# Patient Record
Sex: Female | Born: 1935 | Race: White | Hispanic: No | Marital: Single | Smoking: Never smoker
Health system: Southern US, Community
[De-identification: ages and names within clinical notes are randomized; demographics above are authoritative.]

## PROBLEM LIST (undated history)

## (undated) DIAGNOSIS — I2510 Atherosclerotic heart disease of native coronary artery without angina pectoris: Secondary | ICD-10-CM

## (undated) DIAGNOSIS — M5430 Sciatica, unspecified side: Secondary | ICD-10-CM

## (undated) DIAGNOSIS — S72009A Fracture of unspecified part of neck of unspecified femur, initial encounter for closed fracture: Secondary | ICD-10-CM

## (undated) DIAGNOSIS — M1990 Unspecified osteoarthritis, unspecified site: Secondary | ICD-10-CM

## (undated) DIAGNOSIS — E785 Hyperlipidemia, unspecified: Secondary | ICD-10-CM

## (undated) DIAGNOSIS — H9190 Unspecified hearing loss, unspecified ear: Secondary | ICD-10-CM

## (undated) DIAGNOSIS — I10 Essential (primary) hypertension: Secondary | ICD-10-CM

## (undated) DIAGNOSIS — I219 Acute myocardial infarction, unspecified: Secondary | ICD-10-CM

## (undated) DIAGNOSIS — G629 Polyneuropathy, unspecified: Secondary | ICD-10-CM

## (undated) HISTORY — DX: Atherosclerotic heart disease of native coronary artery without angina pectoris: I25.10

## (undated) HISTORY — DX: Essential (primary) hypertension: I10

## (undated) HISTORY — DX: Hyperlipidemia, unspecified: E78.5

## (undated) HISTORY — PX: OTHER SURGICAL HISTORY: SHX169

## (undated) HISTORY — PX: ABDOMINAL HYSTERECTOMY: SHX81

---

## 1958-08-07 HISTORY — PX: DILATION AND CURETTAGE OF UTERUS: SHX78

## 1996-08-07 HISTORY — PX: CATARACT EXTRACTION: SUR2

## 1998-08-07 HISTORY — PX: CHOLECYSTECTOMY: SHX55

## 2000-08-07 DIAGNOSIS — I219 Acute myocardial infarction, unspecified: Secondary | ICD-10-CM

## 2000-08-07 HISTORY — PX: REFRACTIVE SURGERY: SHX103

## 2000-08-07 HISTORY — PX: PTCA: SHX146

## 2000-08-07 HISTORY — DX: Acute myocardial infarction, unspecified: I21.9

## 2007-08-08 HISTORY — PX: NM MYOVIEW LTD: HXRAD82

## 2009-10-21 ENCOUNTER — Encounter: Payer: Self-pay | Admitting: Family Medicine

## 2010-04-19 ENCOUNTER — Ambulatory Visit: Payer: Self-pay | Admitting: Family Medicine

## 2010-04-19 DIAGNOSIS — E559 Vitamin D deficiency, unspecified: Secondary | ICD-10-CM | POA: Insufficient documentation

## 2010-04-19 DIAGNOSIS — M1A9XX1 Chronic gout, unspecified, with tophus (tophi): Secondary | ICD-10-CM

## 2010-04-19 DIAGNOSIS — I10 Essential (primary) hypertension: Secondary | ICD-10-CM

## 2010-04-19 DIAGNOSIS — E785 Hyperlipidemia, unspecified: Secondary | ICD-10-CM

## 2010-04-20 LAB — CONVERTED CEMR LAB
CO2: 26 meq/L (ref 19–32)
Chloride: 97 meq/L (ref 96–112)
GFR calc non Af Amer: 54.76 mL/min (ref >60)
Glucose, Bld: 94 mg/dL (ref 70–99)
HDL: 44.6 mg/dL (ref >39.00)
LDL Cholesterol: 75 mg/dL (ref 0–99)
Potassium: 4.2 meq/L (ref 3.5–5.1)
Sodium: 136 meq/L (ref 135–145)
Total Bilirubin: 0.7 mg/dL (ref 0.3–1.2)
Total CHOL/HDL Ratio: 3
VLDL: 23.4 mg/dL (ref 0.0–40.0)

## 2010-05-20 ENCOUNTER — Ambulatory Visit: Payer: Self-pay | Admitting: Family Medicine

## 2010-09-06 NOTE — Letter (Signed)
Summary: Records from [facility] [place]  Records from [facility] [place]   Imported By: Maryln Gottron 04/22/2010 12:20:50  _____________________________________________________________________  External Attachment:    Type:   Image     Comment:   External Document

## 2010-09-06 NOTE — Assessment & Plan Note (Signed)
Summary: 1 MONTH FOLLOW UP/C[facility]   Vital Signs:  Patient profile:   77 year old female Menstrual status:  postmenopausal Weight:      152 pounds Temp:     98.5 degrees F oral BP sitting:   160 / 84  (left arm) BP standing:   128 / 70 Cuff size:   regular  Vitals Entered By: Sid Falcon LPN (May 20, 2010 10:14 AM)  Serial Vital Signs/Assessments:  Time      Position  BP       Pulse  Resp  Temp     By           Sitting   142/80                         Evelena Peat MD           Standing  128/70                         Evelena Peat MD   History of Present Illness: Patient seen in followup hypertension. Elevated blood pressure last visit. She takes lisinopril, Toprol, and hydrochlorothiazide and compliant with all. Occasional lightheadedness when she first stands up. No syncope. Denies any chest pains.  Patient has gout and needs refills allopurinol. no recent gout flares.  Allergies (verified): 1)  Penicillin V Potassium (Penicillin V Potassium)  Review of Systems      See HPI  Physical Exam  General:  Well-developed,well-nourished,in no acute distress; alert,appropriate and cooperative throughout examination Lungs:  Normal respiratory effort, chest expands symmetrically. Lungs are clear to auscultation, no crackles or wheezes. Heart:  normal rate and regular rhythm.   Extremities:  no edema.   Impression & Recommendations:  Problem # 1:  HYPERTENSION (ICD-401.9) Assessment Improved improved by today's reading. No need for additional medicine especially given drop in blood pressure with standing but no significant  orthostatic change Her updated medication list for this problem includes:    Lisinopril 20 Mg Tabs (Lisinopril) ..... Once daily    Toprol Xl 50 Mg Xr24h-tab (Metoprolol succinate) ..... Once daily    Hydrochlorothiazide 25 Mg Tabs (Hydrochlorothiazide) ..... Once daily  Problem # 2:  GOUTY TOPHI (ICD-274.82)  Her updated medication list for this  problem includes:    Allopurinol 300 Mg Tabs (Allopurinol) ..... Once daily  Orders: Prescription Created Electronically [phone])  Complete Medication List: 1)  Lisinopril 20 Mg Tabs (Lisinopril) .... Once daily 2)  Plavix 75 Mg Tabs (Clopidogrel bisulfate) .... Once daily 3)  Toprol Xl 50 Mg Xr24h-tab (Metoprolol succinate) .... Once daily 4)  Hydrochlorothiazide 25 Mg Tabs (Hydrochlorothiazide) .... Once daily 5)  Allopurinol 300 Mg Tabs (Allopurinol) .... Once daily 6)  Calcium Plus Vitamin D 300-100 Mg-unit Caps (Calcium carbonate-vitamin d) .... Once daily 7)  Aspirin 81 Mg Tabs (Aspirin) .... Once daily 8)  Lipitor 20 Mg Tabs (Atorvastatin calcium) .... Once daily 9)  One-a-day Womens Prenatal 28-0.8 & 440 Mg Misc (Prenatal vit-fe fum-fa-omega) .... Once daily  Patient Instructions: 1)  Please schedule a follow-up appointment in 6 months .  Prescriptions: ALLOPURINOL 300 MG TABS (ALLOPURINOL) once daily  #90 x 3   Entered and Authorized by:   Evelena Peat MD   Signed by:   Evelena Peat MD on 05/20/2010   Method used:   Faxed to ...       [facility] (mail-order)             ,  [place]         Ph: [phone]       Fax: [phone]   RxID:   [id number]

## 2010-09-06 NOTE — Assessment & Plan Note (Signed)
Summary: to estab/pt will come fasting/cb   Vital Signs:  Patient profile:   77 year old female Menstrual status:  postmenopausal Height:      60.50 inches Weight:      153 pounds BMI:     29.50 Temp:     98.2 degrees F oral Pulse rate:   72 / minute Pulse rhythm:   regular Resp:     12 per minute BP sitting:   160 / 90  (left arm) Cuff size:   regular  Vitals Entered By: Sid Falcon LPN (April 19, 2010 9:00 AM)  Nutrition Counseling: Patient's BMI is greater than 25 and therefore counseled on weight management options.  Serial Vital Signs/Assessments:  Time      Position  BP       Pulse  Resp  Temp     By                     150/88                         Evelena Peat MD  CC: New to establish, pt fasting for labs Is Patient Diabetic? No     Menstrual Status postmenopausal   History of Present Illness: New patient to establish care.  Patient's past medical history reviewed. She has history of coronary artery disease with reported MI and stent placement in 2002. She sees cardiologist regularly. She also has history of hypertension and hyperlipidemia. Her medications were reviewed.  She has history of tophaceous gout. Uric acid this past March 8.8 and allopurinol increased at that point to 300 mg. She initially had some diarrhea but no side effects at this time and is compliant with medication.  Needs followup level.  History of vitamin D deficiency with vitamin D level of 20 this past March. She started over-the-counter supplement 2000 international units per day at that time.  Previous cholecystectomy. Total abdominal hysterotomy secondary to fibroids.  Family history signif for father with heart disease. Social history is that she has never married. No children. Nonsmoker. No alcohol use. Retired Set designer. She moved here from [place] about 6 years ago to be closer to family  Preventive Screening-Counseling &  Management  Alcohol-Tobacco     Smoking Status: never  Caffeine-Diet-Exercise     Does Patient Exercise: no      Blood Transfusions:  yes.    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[place] Single Never Smoked Alcohol use-no Regular exercise-no  Risk Factors: Exercise: no (04/19/2010)  Risk Factors: Smoking Status: never (04/19/2010)  Past Medical History: Tophaceous gout Heart disease Stent 2002 Hyperlipidemia Hypertension Vit D deficiency  Past Surgical History: Cholecystectomy, 2000 Hysterectomy, 1976  TAH sec fibroids Cataracts, 1998 MI, stent put in 2002 PMH-FH-SH reviewed for relevance  Family History: Family History of Arthritis, mother Heart disease, father  Social History: 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    Physical Exam  General:  Well-developed,well-nourished,in no acute distress; alert,appropriate and cooperative throughout examination Head:  normocephalic, atraumatic, and no abnormalities observed.   Eyes:  pupils equal, pupils round, and pupils reactive to light.   Ears:  External ear exam shows no significant lesions or deformities.  Otoscopic examination reveals clear canals, tympanic membranes are intact bilaterally without bulging, retraction, inflammation or discharge. Hearing is grossly normal  bilaterally. Mouth:  Oral mucosa and oropharynx without lesions or exudates.  Teeth in good repair. Neck:  No deformities, masses, or tenderness noted. Lungs:  Normal respiratory effort, chest expands symmetrically. Lungs are clear to auscultation, no crackles or wheezes. Heart:  normal rate and regular rhythm.   Abdomen:  soft and non-tender.   Msk:  R second toe tophaceous changes but no eruption through skin.  R index finger tophaceous changes at DIP joint. Extremities:  no significant pitting edema Neurologic:  alert & oriented X3, cranial nerves II-XII intact, and gait normal.   Skin:  no rashes.   Cervical Nodes:  No lymphadenopathy noted Psych:  normally interactive, good eye contact, not anxious appearing, and not depressed appearing.     Impression & Recommendations:  Problem # 1:  HYPERLIPIDEMIA (ICD-272.4)  Orders: Specimen Handling (84696) Venipuncture (29528) TLB-Lipid Panel (80061-LIPID) TLB-Hepatic/Liver Function Pnl (80076-HEPATIC)  Problem # 2:  HYPERTENSION (ICD-401.9)  somewhat elevated today. Bring back in one month to reassess. Her updated medication list for this problem includes:    Lisinopril 20 Mg Tabs (Lisinopril) ..... Once daily    Toprol Xl 50 Mg Xr24h-tab (Metoprolol succinate) ..... Once daily    Hydrochlorothiazide 25 Mg Tabs (Hydrochlorothiazide) ..... Once daily  Orders: Specimen Handling (41324) Venipuncture (40102) TLB-BMP (Basic Metabolic Panel-BMET) (80048-METABOL)  Problem # 3:  GOUTY TOPHI (ICD-274.82) repeat uric acid.  hopefully, these will improve with time. Her updated medication list for this problem includes:    Allopurinol 300 Mg Tabs (Allopurinol) ..... Once daily  Orders: TLB-Uric Acid, Blood (84550-URIC)  Problem # 4:  VITAMIN D DEFICIENCY (ICD-268.9) repeat levels. Orders: T-Vitamin D (25-Hydroxy) [phone]) Specimen Handling (47425) Venipuncture (95638)  Problem # 5:  CAD (ICD-414.00)  Her updated medication  list for this problem includes:    Lisinopril 20 Mg Tabs (Lisinopril) ..... Once daily    Plavix 75 Mg Tabs (Clopidogrel bisulfate) ..... Once daily    Toprol Xl 50 Mg Xr24h-tab (Metoprolol succinate) ..... Once daily    Hydrochlorothiazide 25 Mg Tabs (Hydrochlorothiazide) ..... Once daily    Aspirin 81 Mg Tabs (Aspirin) ..... Once daily  Orders: TLB-Lipid Panel (80061-LIPID)  Problem # 6:  Preventive Health Care (ICD-V70.0) needs flu vaccine and Tdap.  She has had prior PVX.  Complete Medication List: 1)  Lisinopril 20 Mg Tabs (Lisinopril) .... Once daily 2)  Plavix 75 Mg Tabs (Clopidogrel bisulfate) .... Once daily 3)  Toprol Xl 50 Mg Xr24h-tab (Metoprolol succinate) .... Once daily 4)  Hydrochlorothiazide 25 Mg Tabs (Hydrochlorothiazide) .... Once daily 5)  Allopurinol 300 Mg Tabs (Allopurinol) .... Once daily 6)  Calcium Plus Vitamin D 300-100 Mg-unit Caps (Calcium carbonate-vitamin d) .... Once daily 7)  Aspirin 81 Mg Tabs (Aspirin) .... Once daily  Other Orders: Tdap => 16yrs IM (75643) Admin 1st Vaccine (32951) Flu Vaccine 81yrs + MEDICARE PATIENTS (O8416) Administration Flu vaccine - MCR (S0630)  Patient Instructions: 1)  Please schedule a follow-up appointment in 1 month.  2)  Check your  Blood Pressure regularly . If it is above:140/90  you should make an appointment.    Immunizations Administered:  Tetanus Vaccine:    Vaccine Type: Tdap    Site: left deltoid    Mfr: GlaxoSmithKline    Dose: 0.5 ml    Route: IM    Given by: Sid Falcon LPN    Exp. Date: 04/08/2012    Lot #: [id number]    VIS given: 06/24/08 version given April 19, 2010.         Flu Vaccine Consent Questions     Do you have a history of severe allergic reactions to this vaccine? no    Any prior history of allergic reactions to egg and/or gelatin? no    Do you have a sensitivity to the preservative Thimersol? no    Do you have a past history of Guillan-Barre Syndrome? no    Do  you currently have an acute febrile illness? no    Have you ever had a severe reaction to latex? no    Vaccine information given and explained to patient? yes    Are you currently pregnant? no    Lot Number:[id number]   Exp Date:02/04/2011   Site Given  Right Deltoid IMu

## 2010-10-12 ENCOUNTER — Encounter: Payer: Self-pay | Admitting: *Deleted

## 2010-10-12 DIAGNOSIS — E785 Hyperlipidemia, unspecified: Secondary | ICD-10-CM | POA: Insufficient documentation

## 2010-10-12 DIAGNOSIS — I10 Essential (primary) hypertension: Secondary | ICD-10-CM | POA: Insufficient documentation

## 2010-10-20 ENCOUNTER — Encounter: Payer: Self-pay | Admitting: Family Medicine

## 2010-10-21 ENCOUNTER — Ambulatory Visit (INDEPENDENT_AMBULATORY_CARE_PROVIDER_SITE_OTHER): Payer: Medicare Other | Admitting: Family Medicine

## 2010-10-21 ENCOUNTER — Encounter: Payer: Self-pay | Admitting: Family Medicine

## 2010-10-21 VITALS — BP 150/90 | Temp 98.3°F | Ht 61.0 in | Wt 153.0 lb

## 2010-10-21 DIAGNOSIS — I10 Essential (primary) hypertension: Secondary | ICD-10-CM

## 2010-10-21 DIAGNOSIS — L989 Disorder of the skin and subcutaneous tissue, unspecified: Secondary | ICD-10-CM

## 2010-10-21 DIAGNOSIS — M1A9XX1 Chronic gout, unspecified, with tophus (tophi): Secondary | ICD-10-CM

## 2010-10-21 DIAGNOSIS — I2510 Atherosclerotic heart disease of native coronary artery without angina pectoris: Secondary | ICD-10-CM

## 2010-10-21 DIAGNOSIS — E785 Hyperlipidemia, unspecified: Secondary | ICD-10-CM

## 2010-10-21 MED ORDER — HYDROCHLOROTHIAZIDE 25 MG PO TABS: 25.0000 mg | ORAL_TABLET | Freq: Every day | ORAL | Status: DC

## 2010-10-21 MED ORDER — METOPROLOL SUCCINATE ER 50 MG PO TB24: 50.0000 mg | ORAL_TABLET | Freq: Every day | ORAL | Status: DC

## 2010-10-21 MED ORDER — ATORVASTATIN CALCIUM 20 MG PO TABS: 20.0000 mg | ORAL_TABLET | Freq: Every day | ORAL | Status: DC

## 2010-10-21 MED ORDER — CLOPIDOGREL BISULFATE 75 MG PO TABS: 75.0000 mg | ORAL_TABLET | Freq: Every day | ORAL | Status: DC

## 2010-10-21 MED ORDER — LISINOPRIL 20 MG PO TABS: 20.0000 mg | ORAL_TABLET | Freq: Every day | ORAL | Status: DC

## 2010-10-21 NOTE — Progress Notes (Signed)
  Subjective:    Patient ID: [name] [name], female    DOB: 11-25-1927, 77 y.o.   MRN: [id number]  HPI  Patient here followup multiple medical problems. She has history of coronary artery disease, gout , hyperlipidemia, hypertension. History of cardiac stent back in 2002. Has done well since then. No recent chest pain. Compliant with all medications. Needs refills of several medications today.   No recent orthostatic symptoms. No recent gout flare. She has not had any recent falls or dizziness.  No medication side effects reported.   Skin lesion left cheek region which as been present for several months. Slowly growing. No history of skin cancer.   Review of Systems  Constitutional: Negative for fever, chills, activity change and fatigue.  HENT: Negative for trouble swallowing and neck pain.   Respiratory: Negative for cough and shortness of breath.   Cardiovascular: Negative for chest pain, palpitations and leg swelling.  Gastrointestinal: Negative for abdominal pain and blood in stool.  Genitourinary: Negative for dysuria.  Neurological: Negative for syncope and headaches.  Psychiatric/Behavioral: Negative for dysphoric mood.       Objective:   Physical Exam     Patient alert and in no distress  Oropharynx no lesions Neck supple no mass Chest clear to auscultation Heart regular rhythm and rate  Extremities no pitting edema  Skin exam left cheek region nodular lesion which is about 1 cm diameter with minimal ulceration and  Few telangiectasias    Assessment & Plan:   #1 skin lesion left cheek, probable basal cell carcinoma. Referral skin surgery Center  #2 history of CAD #3 hyperlipidemia #4 hypertension

## 2010-10-22 ENCOUNTER — Encounter: Payer: Self-pay | Admitting: Family Medicine

## 2010-11-04 ENCOUNTER — Ambulatory Visit: Payer: Self-pay | Admitting: Cardiovascular Disease

## 2010-12-07 ENCOUNTER — Encounter: Payer: Self-pay | Admitting: Cardiovascular Disease

## 2010-12-07 ENCOUNTER — Ambulatory Visit (INDEPENDENT_AMBULATORY_CARE_PROVIDER_SITE_OTHER): Payer: Medicare Other | Admitting: Cardiovascular Disease

## 2010-12-07 VITALS — BP 148/88 | HR 61 | Ht 61.0 in | Wt 154.0 lb

## 2010-12-07 DIAGNOSIS — I2510 Atherosclerotic heart disease of native coronary artery without angina pectoris: Secondary | ICD-10-CM

## 2010-12-07 NOTE — Assessment & Plan Note (Signed)
Magdalynn seems to be doing very well. She is not having any episodes of chest pain. She stated limited by her severe back arthritis. We'll continue with her same medications.

## 2010-12-07 NOTE — Progress Notes (Signed)
[name] [name] Date of Birth  Dec 23, 1927 [facility] [address]     [address] [place], [place]  [address] [phone]  Fax  [phone]  History of Present Illness:  No chest pain or dyspnea.  She does not get up and exercise much because she is limited by her severe arthritis. She has lots of back pain and in fact had difficulty getting up on the exam table.   Current Outpatient Prescriptions on File Prior to Visit  Medication Sig Dispense Refill  . allopurinol (ZYLOPRIM) 100 MG tablet Take 100 mg by mouth daily.        [name] aspirin 81 MG tablet Take 81 mg by mouth daily.        . Calcium Carbonate-Vitamin D 300-100 MG-UNIT CAPS Take by mouth daily.        . clindamycin (CLEOCIN) 150 MG capsule Take 150 mg by mouth as needed.        . clopidogrel (PLAVIX) 75 MG tablet Take 1 tablet (75 mg total) by mouth daily.  90 tablet  3  . hydrochlorothiazide 25 MG tablet Take 1 tablet (25 mg total) by mouth daily.  90 tablet  3  . lisinopril (PRINIVIL,ZESTRIL) 20 MG tablet Take 1 tablet (20 mg total) by mouth daily.  90 tablet  3  . metoprolol (TOPROL-XL) 50 MG 24 hr tablet Take 1 tablet (50 mg total) by mouth daily.  30 tablet  0  . atorvastatin (LIPITOR) 20 MG tablet Take 1 tablet (20 mg total) by mouth daily.  90 tablet  3    Allergies  Allergen Reactions  . Penicillins     REACTION: swelling    Past Medical History  Diagnosis Date  . CAD (coronary artery disease)   . HTN (hypertension)   . Hyperlipidemia     Past Surgical History  Procedure Date  . Dilation and curettage of uterus 1960  . Hystrectomy   . Cataract extraction 1998    RIGHT AND LEFT EYE  . Cholecystectomy 2000  . Refractive surgery 2002  . Ptca 2002    History  Smoking status  . Never Smoker   Smokeless tobacco  . Not on file    History  Alcohol Use     Family History  Problem Relation Age of Onset  . Hypertension Father     DECEASED  . Cancer Sister 64     BREAST/BONE CA  . Pneumonia Mother 73  . Aneurysm Brother 33    DECEASED    Reviw of Systems:  Reviewed in the HPI.  All other systems are negative.  Physical Exam: BP 148/88  Pulse 61  Ht 5\' 1"  (1.549 m)  Wt 154 lb (69.854 kg)  BMI 29.10 kg/m2 The patient is alert and oriented x 3.  The mood and affect are normal.  The skin is warm and dry.  Color is normal.  The HEENT exam reveals that the sclera are nonicteric.  The mucous membranes are moist.  The carotids are 2+ without bruits.  There is no thyromegaly.  There is no JVD.  The lungs are clear.  The chest wall is non tender.  The heart exam reveals a regular rate with a normal S1 and S2.  There are no murmurs, gallops, or rubs.  The PMI is not displaced.   Abdominal exam reveals good bowel sounds.  There is no guarding or rebound.  There is no hepatosplenomegaly or tenderness.  There are no  masses.  Exam of the legs reveal no clubbing, cyanosis, or edema.  The legs are without rashes.  The distal pulses are intact.  Cranial nerves II - XII are intact.  Motor and sensory functions are intact.  The gait is normal.  ECG: Normal sinus rhythm. She has an old inferior wall myocardial infarctions. She has poor R-wave progression. Assessment / Plan:

## 2010-12-15 ENCOUNTER — Encounter: Payer: Self-pay | Admitting: Cardiovascular Disease

## 2010-12-16 ENCOUNTER — Encounter: Payer: Self-pay | Admitting: Cardiovascular Disease

## 2011-04-21 ENCOUNTER — Encounter: Payer: Self-pay | Admitting: Family Medicine

## 2011-04-21 ENCOUNTER — Ambulatory Visit (INDEPENDENT_AMBULATORY_CARE_PROVIDER_SITE_OTHER): Payer: Medicare Other | Admitting: Family Medicine

## 2011-04-21 DIAGNOSIS — I10 Essential (primary) hypertension: Secondary | ICD-10-CM

## 2011-04-21 DIAGNOSIS — I2510 Atherosclerotic heart disease of native coronary artery without angina pectoris: Secondary | ICD-10-CM

## 2011-04-21 DIAGNOSIS — Z23 Encounter for immunization: Secondary | ICD-10-CM

## 2011-04-21 DIAGNOSIS — E785 Hyperlipidemia, unspecified: Secondary | ICD-10-CM

## 2011-04-21 DIAGNOSIS — M1A9XX1 Chronic gout, unspecified, with tophus (tophi): Secondary | ICD-10-CM

## 2011-04-21 LAB — HEPATIC FUNCTION PANEL
AST: 20 U/L (ref 0–37)
Albumin: 4.2 g/dL (ref 3.5–5.2)
Alkaline Phosphatase: 69 U/L (ref 39–117)
Bilirubin, Direct: 0.1 mg/dL (ref 0.0–0.3)
Total Protein: 6.9 g/dL (ref 6.0–8.3)

## 2011-04-21 LAB — LIPID PANEL
Cholesterol: 142 mg/dL (ref 0–200)
LDL Cholesterol: 59 mg/dL (ref 0–99)
Triglycerides: 144 mg/dL (ref 0.0–149.0)

## 2011-04-21 LAB — BASIC METABOLIC PANEL
BUN: 16 mg/dL (ref 6–23)
Calcium: 9.9 mg/dL (ref 8.4–10.5)
Chloride: 98 mEq/L (ref 96–112)
Creatinine, Ser: 0.9 mg/dL (ref 0.4–1.2)

## 2011-04-21 NOTE — Progress Notes (Signed)
  Subjective:    Patient ID: [name] [name], female    DOB: [birth year], 77 y.o.   MRN: [id number]  HPI Medical followup. History of CAD, hypertension, hyperlipidemia, and gout.  No recent chest pains. Takes Lipitor for hyperlipidemia. No myalgias. Needs followup lab work. Blood pressure medications reviewed. Compliant with all. She has history of mild elevation here but improves after rest and tends to drop with standing. She's had no orthostasis. No dizziness or headaches.  History of gout. History of tophaceous changes. No recent pain. Tried allopurinol but discontinued because of diarrhea.  Needs flu vaccine. She has had pneumonia vaccine within the past few years  Past Medical History  Diagnosis Date  . CAD (coronary artery disease)   . HTN (hypertension)   . Hyperlipidemia    Past Surgical History  Procedure Date  . Dilation and curettage of uterus 1960  . Hystrectomy   . Cataract extraction 1998    RIGHT AND LEFT EYE  . Cholecystectomy 2000  . Refractive surgery 2002  . Ptca 2002    reports that she has never smoked. She does not have any smokeless tobacco history on file. Her alcohol and drug histories not on file. family history includes Aneurysm (age of onset:62) in her brother; Cancer (age of onset:83) in her sister; Hypertension in her father; and Pneumonia (age of onset:86) in her mother. Allergies  Allergen Reactions  . Penicillins     REACTION: swelling      Review of Systems  Constitutional: Negative for fever, chills, fatigue and unexpected weight change.  Respiratory: Negative for cough and shortness of breath.   Cardiovascular: Negative for chest pain, palpitations and leg swelling.  Gastrointestinal: Negative for abdominal pain.  Genitourinary: Negative for dysuria.  Neurological: Negative for dizziness and syncope.       Objective:   Physical Exam  Constitutional: She is oriented to person, place, and time. She appears well-developed and  well-nourished. No distress.  Neck: Neck supple. No thyromegaly present.  Cardiovascular: Normal rate and regular rhythm.   Pulmonary/Chest: Effort normal and breath sounds normal. No respiratory distress. She has no wheezes. She has no rales.  Musculoskeletal: She exhibits no edema.  Neurological: She is alert and oriented to person, place, and time.  Psychiatric: She has a normal mood and affect. Her behavior is normal. Judgment and thought content normal.          Assessment & Plan:  #1 hypertension. Stable. Continue current medications #2 hyperlipidemia. Recheck lipid and hepatic panel.  #3 gout. Patient currently off allopurinol. Discussed other treatment options (eg Uloric) and she wishes to wait at this time  #4 health maintenance. Needs flu vaccine and given. She will check on coverage for shingles vaccine

## 2011-04-26 ENCOUNTER — Other Ambulatory Visit: Payer: Self-pay | Admitting: Family Medicine

## 2011-04-26 DIAGNOSIS — E876 Hypokalemia: Secondary | ICD-10-CM

## 2011-04-26 NOTE — Progress Notes (Signed)
Quick Note:  Pt informed, labs and list of high K foods sent pt home. Pt aware she needs repeat BMP in 1 month Future lab ordered ______

## 2011-06-04 ENCOUNTER — Other Ambulatory Visit: Payer: Self-pay | Admitting: Family Medicine

## 2011-07-24 ENCOUNTER — Ambulatory Visit (INDEPENDENT_AMBULATORY_CARE_PROVIDER_SITE_OTHER): Payer: Medicare Other | Admitting: Family Medicine

## 2011-07-24 ENCOUNTER — Encounter: Payer: Self-pay | Admitting: Family Medicine

## 2011-07-24 VITALS — BP 160/86 | Temp 97.8°F | Wt 152.0 lb

## 2011-07-24 DIAGNOSIS — M79659 Pain in unspecified thigh: Secondary | ICD-10-CM

## 2011-07-24 DIAGNOSIS — M79609 Pain in unspecified limb: Secondary | ICD-10-CM

## 2011-07-24 MED ORDER — HYDROCODONE-ACETAMINOPHEN 5-325 MG PO TABS: ORAL_TABLET | ORAL | Status: DC

## 2011-07-24 NOTE — Patient Instructions (Signed)
Follow up promptly for any progressive pain or weakness.

## 2011-07-24 NOTE — Progress Notes (Signed)
  Subjective:    Patient ID: [name] [name], female    DOB: 12/02/1927, 77 y.o.   MRN: [id number]  HPI  Acute visit. Two-week history of progressive left anterior thigh pain. Pain radiates from anterior hip down to the knee but not below the knee. Pain is sharp quality. Severe at times10 out of 10. Worse with walking and better at rest. Tylenol with minimal relief. Denies back pain. No numbness. Questionable mild weakness. Denies injury. No leg edema. No history of appetite or weight changes. No history of cancer. No urine or stool incontinence. No claudication symptoms.  Hx of CAD.  No recent chest pains.  Past Medical History  Diagnosis Date  . CAD (coronary artery disease)   . HTN (hypertension)   . Hyperlipidemia    Past Surgical History  Procedure Date  . Dilation and curettage of uterus 1960  . Hystrectomy   . Cataract extraction 1998    RIGHT AND LEFT EYE  . Cholecystectomy 2000  . Refractive surgery 2002  . Ptca 2002    reports that she has never smoked. She does not have any smokeless tobacco history on file. Her alcohol and drug histories not on file. family history includes Aneurysm (age of onset:62) in her brother; Cancer (age of onset:83) in her sister; Hypertension in her father; and Pneumonia (age of onset:86) in her mother. Allergies  Allergen Reactions  . Penicillins     REACTION: swelling     Review of Systems  Constitutional: Negative for fever, chills, activity change, appetite change and fatigue.  Respiratory: Negative for cough and shortness of breath.   Cardiovascular: Negative for chest pain.  Gastrointestinal: Negative for abdominal pain.  Genitourinary: Negative for dysuria.  Neurological: Negative for dizziness and syncope.  Hematological: Negative for adenopathy. Does not bruise/bleed easily.       Objective:   Physical Exam  Constitutional: She is oriented to person, place, and time. She appears well-developed and well-nourished.    Cardiovascular: Normal rate and regular rhythm.   Pulmonary/Chest: Effort normal and breath sounds normal. No respiratory distress. She has no wheezes. She has no rales.  Musculoskeletal: She exhibits no edema.       Fairly good range of motion left hip. Does have minimal pain with internal and external rotation. No localized bony tenderness hip region. Full range of motion left knee  Feet warm to touch with good cap refill.  Neurological: She is alert and oriented to person, place, and time.       D2 reflexes 2+ knee and ankle bilaterally. No focal strength deficits. Sensory function intact.          Assessment & Plan:  Left thigh pain. Question neuropathic. Nonfocal neuro exam. Limited hydrocodone 325/5 mg one to 2 every 6 hours for severe pain. Obtain films left hip and femur. Reassess 2 weeks if no improvement

## 2011-07-28 ENCOUNTER — Ambulatory Visit (INDEPENDENT_AMBULATORY_CARE_PROVIDER_SITE_OTHER)
Admission: RE | Admit: 2011-07-28 | Discharge: 2011-07-28 | Disposition: A | Discharge status: Home / Self Care | Payer: Medicare Other | Source: Ambulatory Visit | Attending: Family Medicine | Admitting: Family Medicine

## 2011-07-28 DIAGNOSIS — M79659 Pain in unspecified thigh: Secondary | ICD-10-CM

## 2011-07-28 DIAGNOSIS — M79609 Pain in unspecified limb: Secondary | ICD-10-CM

## 2011-07-28 NOTE — Progress Notes (Signed)
Quick Note:  Pt informed. Her question is, what do I do with this ongoing pain? The hydrocodone 5-325 is almost gone and I have no refills. [facility] ______

## 2011-07-31 ENCOUNTER — Other Ambulatory Visit: Payer: Self-pay | Admitting: Family Medicine

## 2011-07-31 MED ORDER — GABAPENTIN 100 MG PO CAPS: ORAL_CAPSULE | ORAL | Status: DC

## 2011-07-31 NOTE — Progress Notes (Signed)
Quick Note:  Pt informed ______ 

## 2011-08-17 ENCOUNTER — Encounter: Payer: Self-pay | Admitting: Family Medicine

## 2011-08-17 ENCOUNTER — Ambulatory Visit (INDEPENDENT_AMBULATORY_CARE_PROVIDER_SITE_OTHER): Payer: Medicare Other | Admitting: Family Medicine

## 2011-08-17 VITALS — BP 124/80 | Temp 97.7°F | Wt 152.0 lb

## 2011-08-17 DIAGNOSIS — G5790 Unspecified mononeuropathy of unspecified lower limb: Secondary | ICD-10-CM

## 2011-08-17 DIAGNOSIS — M792 Neuralgia and neuritis, unspecified: Secondary | ICD-10-CM

## 2011-08-17 NOTE — Progress Notes (Signed)
  Subjective:    Patient ID: [name] [name], female    DOB: 12/18/1927, 78 y.o.   MRN: [id number]  HPI  Followup left thigh pain. Refer to prior note. She had suspected neuropathic pain left anterior upper thigh down toward the knee. No weakness. No incontinence. X-rays of her left thigh were unremarkable. She had minimal relief with hydrocodone. We prescribed gabapentin at a very low dosage of 100 mg twice a day she had almost total relief of her pain. No skin rash.  No fever or chills.  No appetite or weight change.  Denies any back pain  Review of Systems As per history of present illness    Objective:   Physical Exam  Constitutional: She appears well-developed and well-nourished. No distress.  Cardiovascular: Normal rate and regular rhythm.   Pulmonary/Chest: Effort normal and breath sounds normal. No respiratory distress. She has no wheezes. She has no rales.  Musculoskeletal: She exhibits no edema.  Neurological:       Full-strength lower extremity. Normal sensory function to touch.   negative straight leg raise          Assessment & Plan:  Neuropathic left thigh pain almost resolved on low-dose gabapentin. After one month try tapering to 100 mg daily then discontinue after another month. She is followup assessment in March

## 2011-08-17 NOTE — Patient Instructions (Signed)
Consider in one month tapering gabapentin to one daily for one month then discontinue if no pain

## 2011-09-02 ENCOUNTER — Other Ambulatory Visit: Payer: Self-pay | Admitting: Family Medicine

## 2011-10-19 ENCOUNTER — Encounter: Payer: Self-pay | Admitting: Family Medicine

## 2011-10-19 ENCOUNTER — Ambulatory Visit (INDEPENDENT_AMBULATORY_CARE_PROVIDER_SITE_OTHER): Payer: Medicare Other | Admitting: Family Medicine

## 2011-10-19 VITALS — BP 142/80 | HR 52 | Temp 97.5°F | Wt 154.0 lb

## 2011-10-19 DIAGNOSIS — E785 Hyperlipidemia, unspecified: Secondary | ICD-10-CM

## 2011-10-19 DIAGNOSIS — E876 Hypokalemia: Secondary | ICD-10-CM

## 2011-10-19 DIAGNOSIS — Z299 Encounter for prophylactic measures, unspecified: Secondary | ICD-10-CM

## 2011-10-19 DIAGNOSIS — M792 Neuralgia and neuritis, unspecified: Secondary | ICD-10-CM | POA: Insufficient documentation

## 2011-10-19 DIAGNOSIS — IMO0002 Reserved for concepts with insufficient information to code with codable children: Secondary | ICD-10-CM

## 2011-10-19 DIAGNOSIS — I2510 Atherosclerotic heart disease of native coronary artery without angina pectoris: Secondary | ICD-10-CM

## 2011-10-19 DIAGNOSIS — I10 Essential (primary) hypertension: Secondary | ICD-10-CM

## 2011-10-19 LAB — BASIC METABOLIC PANEL
BUN: 15 mg/dL (ref 6–23)
CO2: 27 mEq/L (ref 19–32)
Calcium: 10.2 mg/dL (ref 8.4–10.5)
Creatinine, Ser: 1.1 mg/dL (ref 0.4–1.2)

## 2011-10-19 MED ORDER — PNEUMOCOCCAL VAC POLYVALENT 25 MCG/0.5ML IJ INJ: 0.5000 mL | INJECTION | Freq: Once | INTRAMUSCULAR | Status: DC

## 2011-10-19 MED ORDER — GABAPENTIN 100 MG PO CAPS: ORAL_CAPSULE | ORAL | Status: DC

## 2011-10-19 NOTE — Progress Notes (Signed)
  Subjective:    Patient ID: [name] [name], female    DOB: 1927/11/21, 78 y.o.   MRN: [id number]  HPI  Medical followup. Chronic problems include history of CAD, hypertension, hyperlipidemia, and gout. Patient previously on allopurinol but did not tolerate secondary to diarrhea.  She had mild hypokalemia back in the fall and never came back for followup. Medications reviewed. She takes low-dose HCTZ. Blood pressures have generally been well controlled. No recent chest pain. No dizziness. No recent falls. She has some chronic arthritis especially right knee.  Patient had recent neuropathic type pain lower extremity. No weakness. Pain resolved with gabapentin one twice a day. She tried tapering back to once daily but had breakthrough symptoms.  We cannot confirm prior Pneumovax. Possibly several years ago back in [place].  Past Medical History  Diagnosis Date  . CAD (coronary artery disease)   . HTN (hypertension)   . Hyperlipidemia    Past Surgical History  Procedure Date  . Dilation and curettage of uterus 1960  . Hystrectomy   . Cataract extraction 1998    RIGHT AND LEFT EYE  . Cholecystectomy 2000  . Refractive surgery 2002  . Ptca 2002    reports that she has never smoked. She does not have any smokeless tobacco history on file. Her alcohol and drug histories not on file. family history includes Aneurysm (age of onset:62) in her brother; Cancer (age of onset:83) in her sister; Hypertension in her father; and Pneumonia (age of onset:86) in her mother. Allergies  Allergen Reactions  . Penicillins     REACTION: swelling      Review of Systems  Constitutional: Negative for fatigue.  HENT: Negative for trouble swallowing.   Eyes: Negative for visual disturbance.  Respiratory: Negative for cough, chest tightness, shortness of breath and wheezing.   Cardiovascular: Negative for chest pain, palpitations and leg swelling.  Gastrointestinal: Negative for abdominal pain.    Genitourinary: Negative for dysuria.  Neurological: Negative for dizziness, seizures, syncope, weakness, light-headedness and headaches.  Hematological: Negative for adenopathy.  Psychiatric/Behavioral: Negative for confusion.       Objective:   Physical Exam  Constitutional: She is oriented to person, place, and time. She appears well-developed and well-nourished.  HENT:  Right Ear: External ear normal.  Left Ear: External ear normal.  Mouth/Throat: Oropharynx is clear and moist.  Neck: Neck supple.  Cardiovascular: Regular rhythm.        Slightly bradycardic with heart rate around 50-56  Pulmonary/Chest: Effort normal and breath sounds normal. No respiratory distress. She has no wheezes. She has no rales.  Musculoskeletal: She exhibits no edema.  Neurological: She is alert and oriented to person, place, and time. No cranial nerve deficit.  Psychiatric: She has a normal mood and affect. Her behavior is normal. Judgment and thought content normal.          Assessment & Plan:  #1 history of CAD. Patient encouraged to continue cardiology followup  #2 hyperlipidemia well controlled at labs 6 months ago. Will check yearly.  #3 recent neuropathic pain essentially resolved with gabapentin low dosage. She tried tapering back to once daily but had breakthrough symptoms. Medication refilled #4 recent mild hypokalemia. Recheck basic metabolic panel. If still low at potassium replacement #5 hypertension stable

## 2011-10-20 NOTE — Progress Notes (Signed)
Quick Note:  Pt informed ______ 

## 2011-12-11 ENCOUNTER — Ambulatory Visit (INDEPENDENT_AMBULATORY_CARE_PROVIDER_SITE_OTHER): Payer: Medicare Other | Admitting: Cardiovascular Disease

## 2011-12-11 ENCOUNTER — Encounter: Payer: Self-pay | Admitting: Cardiovascular Disease

## 2011-12-11 VITALS — BP 130/70 | HR 65 | Ht 63.0 in | Wt 154.0 lb

## 2011-12-11 DIAGNOSIS — I2510 Atherosclerotic heart disease of native coronary artery without angina pectoris: Secondary | ICD-10-CM

## 2011-12-11 NOTE — Progress Notes (Signed)
[name] [name] Date of Birth  August 04, 1928       [facility] [place] Office [address] [place], [place]  [address] [phone]   Fax  [phone]   [facility] [address] [place], [place]  [address] [phone]  Fax [phone]  Problem List: 1. Coronary artery disease-status post stenting in [place] approximately 10 years ago. Nuclear stress test in 2009 revealed a previous inferior wall myocardial infarction.  2. Hypertension 3. Hyperlipidemia  History of Present Illness:  [name] [name] has done well since I last saw her. She's not had any episodes of chest pain.  She tries to walk a little bit.  She is limited by arthritis - not by cardiac issues.  Current Outpatient Prescriptions on File Prior to Visit  Medication Sig Dispense Refill  . aspirin 81 MG tablet Take 81 mg by mouth daily.        [name] atorvastatin (LIPITOR) 20 MG tablet TAKE 1 TABLET DAILY  90 tablet  2  . clindamycin (CLEOCIN) 150 MG capsule Take 150 mg by mouth as needed.        . clopidogrel (PLAVIX) 75 MG tablet TAKE 1 TABLET DAILY  90 tablet  2  . hydrochlorothiazide (HYDRODIURIL) 25 MG tablet TAKE 1 TABLET DAILY  90 tablet  2  . HYDROcodone-acetaminophen (NORCO) 5-325 MG per tablet 1-2 tablets every 6 hours as needed.  30 tablet  0  . lisinopril (PRINIVIL,ZESTRIL) 20 MG tablet TAKE 1 TABLET DAILY  90 tablet  2  . metoprolol succinate (TOPROL-XL) 50 MG 24 hr tablet TAKE 1 TABLET DAILY  90 tablet  2  . DISCONTD: gabapentin (NEURONTIN) 100 MG capsule 1 tab by mouth bid  180 capsule  3   Current Facility-Administered Medications on File Prior to Visit  Medication Dose Route Frequency Provider Last Rate Last Dose  . pneumococcal 23 valent vaccine (PNU-IMMUNE) injection 0.5 mL  0.5 mL Intramuscular Once Kristian Covey, MD        Allergies  Allergen Reactions  . Penicillins     REACTION: swelling    Past Medical History  Diagnosis Date  . CAD (coronary artery disease)   .  HTN (hypertension)   . Hyperlipidemia     Past Surgical History  Procedure Date  . Dilation and curettage of uterus 1960  . Hystrectomy   . Cataract extraction 1998    RIGHT AND LEFT EYE  . Cholecystectomy 2000  . Refractive surgery 2002  . Ptca 2002    History  Smoking status  . Never Smoker   Smokeless tobacco  . Not on file    History  Alcohol Use     Family History  Problem Relation Age of Onset  . Hypertension Father     DECEASED  . Cancer Sister 89    BREAST/BONE CA  . Pneumonia Mother 47  . Aneurysm Brother 75    DECEASED    Reviw of Systems:  Reviewed in the HPI.  All other systems are negative.  Physical Exam: Blood pressure 130/70, pulse 65, height 5\' 3"  (1.6 m), weight 154 lb (69.854 kg). General: Well developed, well nourished, in no acute distress.  Head: Normocephalic, atraumatic, sclera non-icteric, mucus membranes are moist,   Neck: Supple. Carotids are 2 + without bruits. No JVD  Lungs: Clear bilaterally to auscultation.  Heart: regular rate.  normal  S1 S2. No murmurs, gallops or rubs.  Abdomen: Soft, non-tender, non-distended with normal bowel sounds. No hepatomegaly. No  rebound/guarding. No masses.  Msk:  Strength and tone are normal  Extremities: No clubbing or cyanosis. No edema.  Distal pedal pulses are 2+ and equal bilaterally.  Neuro: Alert and oriented X 3. Moves all extremities spontaneously.  Psych:  Responds to questions appropriately with a normal affect.  ECG: Dec 11, 2011-sinus rhythm with occasional premature atrial contractions. She has occasional premature ventricular contractions.  There is loss of artifact.  Assessment / Plan:

## 2011-12-11 NOTE — Assessment & Plan Note (Signed)
She is stable.  No angina.  Lipids are well controlled.

## 2011-12-11 NOTE — Patient Instructions (Signed)
Your physician wants you to follow-up in: 1 year  You will receive a reminder letter in the mail two months in advance. If you don't receive a letter, please call our office to schedule the follow-up appointment.   Your physician recommends that you return for a FASTING lipid profile: 1 year   

## 2011-12-15 ENCOUNTER — Emergency Department (HOSPITAL_COMMUNITY): Payer: Medicare Other

## 2011-12-15 ENCOUNTER — Inpatient Hospital Stay (HOSPITAL_COMMUNITY): Payer: Medicare Other

## 2011-12-15 ENCOUNTER — Inpatient Hospital Stay (HOSPITAL_COMMUNITY)
Admission: EM | Admit: 2011-12-15 | Discharge: 2011-12-19 | DRG: 481 | Disposition: A | Discharge status: Skilled Nursing Facility | Payer: Medicare Other | Attending: Specialist | Admitting: Specialist

## 2011-12-15 ENCOUNTER — Encounter (HOSPITAL_COMMUNITY): Payer: Self-pay | Admitting: Emergency Medicine

## 2011-12-15 DIAGNOSIS — I2510 Atherosclerotic heart disease of native coronary artery without angina pectoris: Secondary | ICD-10-CM | POA: Diagnosis present

## 2011-12-15 DIAGNOSIS — Y92009 Unspecified place in unspecified non-institutional (private) residence as the place of occurrence of the external cause: Secondary | ICD-10-CM

## 2011-12-15 DIAGNOSIS — S72143A Displaced intertrochanteric fracture of unspecified femur, initial encounter for closed fracture: Principal | ICD-10-CM | POA: Diagnosis present

## 2011-12-15 DIAGNOSIS — R42 Dizziness and giddiness: Secondary | ICD-10-CM | POA: Diagnosis not present

## 2011-12-15 DIAGNOSIS — S72141A Displaced intertrochanteric fracture of right femur, initial encounter for closed fracture: Secondary | ICD-10-CM | POA: Diagnosis present

## 2011-12-15 DIAGNOSIS — I10 Essential (primary) hypertension: Secondary | ICD-10-CM | POA: Diagnosis present

## 2011-12-15 DIAGNOSIS — D62 Acute posthemorrhagic anemia: Secondary | ICD-10-CM | POA: Diagnosis not present

## 2011-12-15 DIAGNOSIS — W1789XA Other fall from one level to another, initial encounter: Secondary | ICD-10-CM | POA: Diagnosis present

## 2011-12-15 DIAGNOSIS — S72009A Fracture of unspecified part of neck of unspecified femur, initial encounter for closed fracture: Secondary | ICD-10-CM

## 2011-12-15 DIAGNOSIS — I252 Old myocardial infarction: Secondary | ICD-10-CM

## 2011-12-15 DIAGNOSIS — W19XXXA Unspecified fall, initial encounter: Secondary | ICD-10-CM

## 2011-12-15 DIAGNOSIS — E871 Hypo-osmolality and hyponatremia: Secondary | ICD-10-CM | POA: Diagnosis present

## 2011-12-15 HISTORY — DX: Acute myocardial infarction, unspecified: I21.9

## 2011-12-15 LAB — CBC
HCT: 35.5 % — ABNORMAL LOW (ref 36.0–46.0)
MCH: 29.2 pg (ref 26.0–34.0)
MCHC: 34.6 g/dL (ref 30.0–36.0)
RDW: 13.1 % (ref 11.5–15.5)

## 2011-12-15 LAB — BASIC METABOLIC PANEL
BUN: 15 mg/dL (ref 6–23)
Calcium: 9.7 mg/dL (ref 8.4–10.5)
GFR calc Af Amer: 59 mL/min — ABNORMAL LOW (ref >90)
GFR calc non Af Amer: 51 mL/min — ABNORMAL LOW (ref >90)
Glucose, Bld: 130 mg/dL — ABNORMAL HIGH (ref 70–99)
Potassium: 3.8 mEq/L (ref 3.5–5.1)
Sodium: 132 mEq/L — ABNORMAL LOW (ref 135–145)

## 2011-12-15 MED ORDER — ONDANSETRON HCL 4 MG/2ML IJ SOLN
4.0000 mg | Freq: Once | INTRAMUSCULAR | Status: AC
  Administered 2011-12-15: 4 mg via INTRAVENOUS
  Filled 2011-12-15: qty 2

## 2011-12-15 MED ORDER — ASPIRIN EC 81 MG PO TBEC
81.0000 mg | DELAYED_RELEASE_TABLET | Freq: Every day | ORAL | Status: DC
  Administered 2011-12-16 – 2011-12-19 (×4): 81 mg via ORAL
  Filled 2011-12-15 (×5): qty 1

## 2011-12-15 MED ORDER — SODIUM CHLORIDE 0.9 % IV SOLN: Freq: Once | INTRAVENOUS | Status: DC

## 2011-12-15 MED ORDER — METHOCARBAMOL 500 MG PO TABS: 500.0000 mg | ORAL_TABLET | Freq: Four times a day (QID) | ORAL | Status: DC | PRN

## 2011-12-15 MED ORDER — LISINOPRIL 20 MG PO TABS
20.0000 mg | ORAL_TABLET | Freq: Every day | ORAL | Status: DC
  Administered 2011-12-16 – 2011-12-19 (×3): 20 mg via ORAL
  Filled 2011-12-15 (×5): qty 1

## 2011-12-15 MED ORDER — GABAPENTIN 100 MG PO CAPS
100.0000 mg | ORAL_CAPSULE | Freq: Two times a day (BID) | ORAL | Status: DC
  Administered 2011-12-15 – 2011-12-19 (×8): 100 mg via ORAL
  Filled 2011-12-15 (×9): qty 1

## 2011-12-15 MED ORDER — HYDROCODONE-ACETAMINOPHEN 5-325 MG PO TABS: 1.0000 | ORAL_TABLET | ORAL | Status: DC | PRN

## 2011-12-15 MED ORDER — HYDROCHLOROTHIAZIDE 25 MG PO TABS
25.0000 mg | ORAL_TABLET | Freq: Every day | ORAL | Status: DC
  Administered 2011-12-16 – 2011-12-19 (×3): 25 mg via ORAL
  Filled 2011-12-15 (×5): qty 1

## 2011-12-15 MED ORDER — VANCOMYCIN HCL IN DEXTROSE 1-5 GM/200ML-% IV SOLN
1000.0000 mg | Freq: Once | INTRAVENOUS | Status: AC
  Administered 2011-12-16: 1000 mg via INTRAVENOUS

## 2011-12-15 MED ORDER — SIMVASTATIN 40 MG PO TABS
40.0000 mg | ORAL_TABLET | Freq: Every day | ORAL | Status: DC
  Administered 2011-12-16 – 2011-12-18 (×3): 40 mg via ORAL
  Filled 2011-12-15 (×5): qty 1

## 2011-12-15 MED ORDER — METHOCARBAMOL 100 MG/ML IJ SOLN
500.0000 mg | Freq: Four times a day (QID) | INTRAVENOUS | Status: DC | PRN
  Filled 2011-12-15: qty 5

## 2011-12-15 MED ORDER — MORPHINE SULFATE 4 MG/ML IJ SOLN
4.0000 mg | Freq: Once | INTRAMUSCULAR | Status: AC
  Administered 2011-12-15: 4 mg via INTRAVENOUS
  Filled 2011-12-15: qty 1

## 2011-12-15 MED ORDER — MORPHINE SULFATE 2 MG/ML IJ SOLN
1.0000 mg | INTRAMUSCULAR | Status: DC | PRN
  Administered 2011-12-15: 1 mg via INTRAVENOUS
  Filled 2011-12-15 (×2): qty 1

## 2011-12-15 MED ORDER — KCL IN DEXTROSE-NACL 20-5-0.45 MEQ/L-%-% IV SOLN
INTRAVENOUS | Status: DC
  Administered 2011-12-15: via INTRAVENOUS
  Filled 2011-12-15: qty 1000

## 2011-12-15 NOTE — Progress Notes (Signed)
Received a phone call from the emergency room this patient has a right intertrochanteric hip fracture that is impacted and nondisplaced. I will the patient to my service as she relatively good health and plan to schedule her for surgery for treatment of the intertrochanteric fracture more morning. Unfortunately we will be unable to see her for several hours as him performing surgeries at [facility]. Maryclare Labrador go ahead and admission orders. Patient is stable.

## 2011-12-15 NOTE — Progress Notes (Signed)
ED CM noted CM consult from admission RN for Pt has fx to right hip. She plans to return home where she lives alone. Please follow for possible home health needs. She does have some medical equipment at home. CM spoke with pt, niece and female family member in ED rm 5.  Reviewed home health, private duty, assisted living, snf, POA and Competency Voiced understanding Pt lives alone Niece encouraging rehab at snf Pt has received medication and is dozing in and out.  Has not used a HH agency previously.  Pending choice and PT/OT recommendations Below choices offered HOME HEALTH AGENCIES SERVING [place]   Agencies that are Medicare-Certified and are affiliated with [facility]  Telephone Number Address  [facility]   The [facility] has ownership interest in this company; however, you are under no obligation to use this agency. [phone] or  [phone] [address] [place], [place] [address]   Agencies that are Medicare-Certified and are not affiliated with [facility] Telephone Number Address  [facility] [phone] Fax [phone] [address] [place], [place]  [address]  [facility] [phone] or [phone] Fax [phone] [address] [place], [place] [address]  [facility] [phone] Fax [phone] [address] [place], [place] [address]  [facility] [phone] Fax [phone] [address] [place], [place]  [address]  [facility] [phone] Fax [phone] [address] Suite [place], [place] [address]  [facility] [phone] [address] [place], [place] [address]  [facility] [phone]   [address] [place], [place] [address]  [facility] [phone] or [phone] Fax [phone] [address] [place], [place]  [address]  [facility] [phone] Fax [phone] [address] [place], [place]  [address]  [facility]  [phone] Fax [phone] [address] [place], [place] [address]

## 2011-12-15 NOTE — H&P (Signed)
[name] [name] is an 78 y.o. female.   Chief Complaint: Right hip pain HPI: 78 year old female fell out of a bush she was trimming a jasmine. Landing on the right hip With pain on any movement or attempts at moving the right leg. X ray with an impacted right intertrochanteric hip  Fracture. History of hip arthritis discomfort prior to fall.  Past Medical History  Diagnosis Date  . CAD (coronary artery disease)   . HTN (hypertension)   . Hyperlipidemia   . MI (myocardial infarction) 2002    Past Surgical History  Procedure Date  . Dilation and curettage of uterus 1960  . Hystrectomy   . Cataract extraction 1998    RIGHT AND LEFT EYE  . Cholecystectomy 2000  . Refractive surgery 2002  . Ptca 2002  . Abdominal hysterectomy     Family History  Problem Relation Age of Onset  . Hypertension Father     DECEASED  . Cancer Sister 31    BREAST/BONE CA  . Pneumonia Mother 9  . Aneurysm Brother 52    DECEASED   Social History:  reports that she has never smoked. She has never used smokeless tobacco. She reports that she does not drink alcohol or use illicit drugs.  Allergies:  Allergies  Allergen Reactions  . Penicillins     REACTION: swelling     (Not in a hospital admission)  Results for orders placed during the hospital encounter of 12/15/11 (from the past 48 hour(s))  TYPE AND SCREEN     Status: Normal   Collection Time   12/15/11  3:20 PM      Component Value Range Comment   ABO/RH(D) O POS      Antibody Screen NEG      Sample Expiration 12/18/2011     CBC     Status: Abnormal   Collection Time   12/15/11  3:50 PM      Component Value Range Comment   WBC 11.5 (*) 4.0 - 10.5 (K/uL)    RBC 4.21  3.87 - 5.11 (MIL/uL)    Hemoglobin 12.3  12.0 - 15.0 (g/dL)    HCT 16.1 (*) 09.6 - 46.0 (%)    MCV 84.3  78.0 - 100.0 (fL)    MCH 29.2  26.0 - 34.0 (pg)    MCHC 34.6  30.0 - 36.0 (g/dL)    RDW 04.5  40.9 - 81.1 (%)    Platelets 267  150 - 400 (K/uL)   BASIC METABOLIC  PANEL     Status: Abnormal   Collection Time   12/15/11  3:50 PM      Component Value Range Comment   Sodium 132 (*) 135 - 145 (mEq/L)    Potassium 3.8  3.5 - 5.1 (mEq/L)    Chloride 95 (*) 96 - 112 (mEq/L)    CO2 26  19 - 32 (mEq/L)    Glucose, Bld 130 (*) 70 - 99 (mg/dL)    BUN 15  6 - 23 (mg/dL)    Creatinine, Ser 9.14  0.50 - 1.10 (mg/dL)    Calcium 9.7  8.4 - 10.5 (mg/dL)    GFR calc non Af Amer 51 (*) >90 (mL/min)    GFR calc Af Amer 59 (*) >90 (mL/min)    Dg Chest 2 View  12/15/2011  *RADIOLOGY REPORT*  Clinical Data: Preoperative chest radiograph.  Hypertension. History of myocardial infarction.  Coronary artery disease.  CHEST - 2 VIEW  Comparison: None.  Findings:  Elevation of the right hemidiaphragm with subsegmental atelectasis or scarring over the hemidiaphragm.  No airspace disease.  On the lateral view, there is fullness of the pulmonary arteries, likely secondary to the elevated right hemidiaphragm. Borderline cardiopericardial silhouette enlargement based on projection.  Small areas of subsegmental atelectasis at the left lung base.  IMPRESSION:  1.  No acute cardiopulmonary disease. 2.  Borderline cardiomegaly. 3.  Elevation of the right hemidiaphragm of unknown chronicity. Associated right basilar atelectasis.  Original Report Authenticated By: Andreas Newport, M.D.   Dg Hip Complete Right  12/15/2011  *RADIOLOGY REPORT*  Clinical Data: Right hip pain post fall  RIGHT HIP - COMPLETE 2+ VIEW  Comparison: 07/28/2011  Findings: Three views of the right hip submitted.  There is diffuse osteopenia.  There is mild impacted intertrochanteric fracture of proximal right femur.  IMPRESSION: Diffuse osteopenia.  Mild impacted intertrochanteric fracture of the proximal right femur.  Per CMS PQRS reporting requirements (PQRS Measure 24): Given the patient's age of greater than 50 and the fracture site (hip, distal radius, or spine), the patient should be tested for osteoporosis using DXA, and  the appropriate treatment considered based on the DXA results.  Original Report Authenticated By: Natasha Mead, M.D.    Review of Systems  Constitutional: Negative for fever, chills, weight loss, malaise/fatigue and diaphoresis.  HENT: Positive for neck pain. Negative for hearing loss, ear pain, nosebleeds, congestion, sore throat, tinnitus and ear discharge.   Eyes: Negative for blurred vision, double vision, photophobia, pain, discharge and redness.  Respiratory: Negative for cough, hemoptysis, sputum production, shortness of breath, wheezing and stridor.   Cardiovascular: Positive for leg swelling. Negative for chest pain, palpitations, orthopnea, claudication and PND.  Gastrointestinal: Negative for heartburn, nausea, vomiting, abdominal pain, diarrhea, constipation, blood in stool and melena.  Genitourinary: Negative for dysuria, urgency, frequency, hematuria and flank pain.  Musculoskeletal: Positive for back pain and joint pain. Negative for myalgias and falls.  Skin: Negative for itching and rash.  Neurological: Positive for dizziness, tingling and focal weakness. Negative for tremors, sensory change, speech change, seizures, loss of consciousness, weakness and headaches.  Endo/Heme/Allergies: Negative for environmental allergies and polydipsia. Does not bruise/bleed easily.  Psychiatric/Behavioral: Negative for depression, suicidal ideas, hallucinations, memory loss and substance abuse. The patient is not nervous/anxious and does not have insomnia.     Blood pressure 177/90, pulse 64, temperature 99 F (37.2 C), temperature source Oral, resp. rate 20, SpO2 94.00%. Physical Exam  Constitutional: She is oriented to person, place, and time. She appears well-developed and well-nourished. No distress.  HENT:  Head: Normocephalic and atraumatic.  Right Ear: External ear normal.  Left Ear: External ear normal.  Nose: Nose normal.  Mouth/Throat: Oropharynx is clear and moist. No  oropharyngeal exudate.  Eyes: Conjunctivae and EOM are normal. Pupils are equal, round, and reactive to light. Right eye exhibits no discharge. Left eye exhibits no discharge. No scleral icterus.  Neck: Neck supple. No JVD present. No tracheal deviation present. No thyromegaly present.  Cardiovascular: Normal rate, regular rhythm, normal heart sounds and intact distal pulses.  Exam reveals no gallop and no friction rub.   No murmur heard. Respiratory: Effort normal and breath sounds normal. No respiratory distress. She has no wheezes. She has no rales. She exhibits no tenderness.  GI: Bowel sounds are normal. She exhibits no distension. There is no tenderness. There is no rebound and no guarding.  Musculoskeletal: Normal range of motion. She exhibits no edema and no tenderness.  Lymphadenopathy:  She has no cervical adenopathy.  Neurological: She is alert and oriented to person, place, and time. She has normal reflexes. She displays normal reflexes. No cranial nerve deficit. She exhibits normal muscle tone. Coordination normal.  Skin: Skin is warm and dry. No rash noted. She is not diaphoretic. No erythema. No pallor.  Psychiatric: She has a normal mood and affect. Her behavior is normal. Judgment and thought content normal.  Orthopedic Eval:  Right leg is externally rotated, shortened by 1/2 inch. Pain with any movement in the right leg.  Assessment/Plan Right closed impacted intertrochanteric hip fracture. Hypertension CAD, MI in 2002, Sees Dr. Jorje Guild  Plan: Admit to [facility] Bedrest, NPO past Midnight  For OR at 0730 AM 12/16/2011.   Mikayla Chiusano E 12/15/2011, 8:42 PM

## 2011-12-15 NOTE — ED Notes (Signed)
Attempted to call report to  Floor, receiving Nurse  unavailable at this time. Sec. Will notify receiving Nurse to call back.

## 2011-12-15 NOTE — ED Notes (Signed)
Per EMS, fell while working in yard, right hip pain with movement

## 2011-12-15 NOTE — ED Notes (Signed)
[id number]<JY> Expected date:12/15/11<BR> Expected time:<BR> Means of arrival:<BR> Comments:<BR> EMS 11 gC = fall/leg pain

## 2011-12-15 NOTE — ED Provider Notes (Addendum)
History     CSN: [id number]  Arrival date & time 12/15/11  1441   First MD Initiated Contact with Patient 12/15/11 1501      Chief Complaint  Patient presents with  . Fall    (Consider location/radiation/quality/duration/timing/severity/associated sxs/prior treatment) HPI  59yoF ho coronary disease on Plavix presents after fall. Patient states that just prior to arrival she fell down onto the [address] outdoors. She states that she is attempting to cut her head his in her foot "got caught" when something. She denies pre-fall headache, dizziness, chest pain, shortness of breath, palpitations. There is no syncope. She denies head trauma. Patient denies neck pain, back pain. She was not ambulatory after the fall. She complains of right hip pain without radiation. Denies numbness, tingling, weakness of her extremities. She also complains of right elbow soreness. She rates her pain as a 7/10 at this time. She did not take anything for her pain prior to arrival. She is transported here by EMS.   Highly functioning, lives alone  Past Medical History  Diagnosis Date  . CAD (coronary artery disease)   . HTN (hypertension)   . Hyperlipidemia   . MI (myocardial infarction) 2002    Past Surgical History  Procedure Date  . Dilation and curettage of uterus 1960  . Hystrectomy   . Cataract extraction 1998    RIGHT AND LEFT EYE  . Cholecystectomy 2000  . Refractive surgery 2002  . Ptca 2002  . Abdominal hysterectomy     Family History  Problem Relation Age of Onset  . Hypertension Father     DECEASED  . Cancer Sister 84    BREAST/BONE CA  . Pneumonia Mother 56  . Aneurysm Brother 27    DECEASED    History  Substance Use Topics  . Smoking status: Never Smoker   . Smokeless tobacco: Never Used  . Alcohol Use: No    OB History    Grav Para Term Preterm Abortions TAB SAB Ect Mult Living                  Review of Systems  All other systems reviewed and are  negative.   except as noted HPI  Allergies  Penicillins  Home Medications   Current Outpatient Rx  Name Route Sig Dispense Refill  . ASPIRIN 81 MG PO TABS Oral Take 81 mg by mouth daily.      . ATORVASTATIN CALCIUM 20 MG PO TABS  TAKE 1 TABLET DAILY 90 tablet 2  . CLOPIDOGREL BISULFATE 75 MG PO TABS  TAKE 1 TABLET DAILY 90 tablet 2  . GABAPENTIN 100 MG PO CAPS  2 tab by mouth bid    . HYDROCHLOROTHIAZIDE 25 MG PO TABS  TAKE 1 TABLET DAILY 90 tablet 2  . LISINOPRIL 20 MG PO TABS  TAKE 1 TABLET DAILY 90 tablet 2  . METOPROLOL SUCCINATE ER 50 MG PO TB24  TAKE 1 TABLET DAILY 90 tablet 2    BP 177/90  Pulse 64  Temp(Src) 99 F (37.2 C) (Oral)  Resp 20  SpO2 94%  Physical Exam  Nursing note and vitals reviewed. Constitutional: She is oriented to person, place, and time. She appears well-developed.  HENT:  Head: Atraumatic.  Mouth/Throat: Oropharynx is clear and moist.  Eyes: Conjunctivae and EOM are normal. Pupils are equal, round, and reactive to light.  Neck: Normal range of motion. Neck supple.  Cardiovascular: Normal rate, regular rhythm, normal heart sounds and intact distal pulses.  Pulmonary/Chest: Effort normal and breath sounds normal. No respiratory distress. She has no wheezes. She has no rales.  Abdominal: Soft. She exhibits no distension. There is no tenderness. There is no rebound and no guarding.  Musculoskeletal: Normal range of motion.       +Pain rt hip with int/ext/flex RLE. Distal pulse intact Propped RLE, refusing to move off pillow, unable to assess for limb shortening  Rt elbow with min ecchymosis. No bony ttp. Full ROM without pain.  Neurological: She is alert and oriented to person, place, and time.  Skin: Skin is warm and dry. No rash noted.  Psychiatric: She has a normal mood and affect.    Date: 12/15/2011  Rate: 56  Rhythm: normal sinus rhythm  QRS Axis: normal  Intervals: normal  ST/T Wave abnormalities: normal  Conduction  Disutrbances:none  Narrative Interpretation:   Old EKG Reviewed: none available  ED Course  Procedures (including critical care time)  Labs Reviewed  CBC - Abnormal; Notable for the following:    WBC 11.5 (*)    HCT 35.5 (*)    All other components within normal limits  BASIC METABOLIC PANEL - Abnormal; Notable for the following:    Sodium 132 (*)    Chloride 95 (*)    Glucose, Bld 130 (*)    GFR calc non Af Amer 51 (*)    GFR calc Af Amer 59 (*)    All other components within normal limits  TYPE AND SCREEN  ABO/RH   Dg Hip Complete Right  12/15/2011  *RADIOLOGY REPORT*  Clinical Data: Right hip pain post fall  RIGHT HIP - COMPLETE 2+ VIEW  Comparison: 07/28/2011  Findings: Three views of the right hip submitted.  There is diffuse osteopenia.  There is mild impacted intertrochanteric fracture of proximal right femur.  IMPRESSION: Diffuse osteopenia.  Mild impacted intertrochanteric fracture of the proximal right femur.  Per CMS PQRS reporting requirements (PQRS Measure 24): Given the patient's age of greater than 50 and the fracture site (hip, distal radius, or spine), the patient should be tested for osteoporosis using DXA, and the appropriate treatment considered based on the DXA results.  Original Report Authenticated By: Natasha Mead, M.D.    1. Hip fracture   2. Fall     MDM  S/p mechanical fall pw Rt hip fx. Neurovasc intact. No BHT. Pre operative labs, EKG ordered. Pain control. D/W Dr. [name] who will admit primarily, likely repair within 24hours. Requested holding orders. Family updated and aware of plan.        Forbes Cellar, MD 12/15/11 1702  Forbes Cellar, MD 12/15/11 2013

## 2011-12-16 ENCOUNTER — Inpatient Hospital Stay (HOSPITAL_COMMUNITY): Payer: Medicare Other

## 2011-12-16 ENCOUNTER — Encounter (HOSPITAL_COMMUNITY)
Admission: EM | Disposition: A | Discharge status: Skilled Nursing Facility | Payer: Self-pay | Source: Home / Self Care | Attending: Specialist

## 2011-12-16 ENCOUNTER — Encounter (HOSPITAL_COMMUNITY): Payer: Self-pay | Admitting: Anesthesiology

## 2011-12-16 ENCOUNTER — Encounter (HOSPITAL_COMMUNITY): Payer: Self-pay | Admitting: *Deleted

## 2011-12-16 ENCOUNTER — Inpatient Hospital Stay (HOSPITAL_COMMUNITY): Payer: Medicare Other | Admitting: Anesthesiology

## 2011-12-16 HISTORY — PX: COMPRESSION HIP SCREW: SHX1386

## 2011-12-16 LAB — CBC
HCT: 30.8 % — ABNORMAL LOW (ref 36.0–46.0)
MCH: 29 pg (ref 26.0–34.0)
MCH: 29.9 pg (ref 26.0–34.0)
MCHC: 35.5 g/dL (ref 30.0–36.0)
MCV: 84.4 fL (ref 78.0–100.0)
MCV: 84.3 fL (ref 78.0–100.0)
Platelets: 222 10*3/uL (ref 150–400)
RBC: 3.65 MIL/uL — ABNORMAL LOW (ref 3.87–5.11)
WBC: 12.8 10*3/uL — ABNORMAL HIGH (ref 4.0–10.5)

## 2011-12-16 LAB — PROTIME-INR: Prothrombin Time: 15.2 seconds (ref 11.6–15.2)

## 2011-12-16 LAB — URINALYSIS, ROUTINE W REFLEX MICROSCOPIC
Bilirubin Urine: NEGATIVE
Glucose, UA: NEGATIVE mg/dL
Ketones, ur: NEGATIVE mg/dL
Specific Gravity, Urine: 1.017 (ref 1.005–1.030)
pH: 6 (ref 5.0–8.0)

## 2011-12-16 LAB — URINE MICROSCOPIC-ADD ON

## 2011-12-16 LAB — SURGICAL PCR SCREEN: Staphylococcus aureus: NEGATIVE

## 2011-12-16 SURGERY — COMPRESSION HIP
Anesthesia: General | Site: Hip | Laterality: Right | Wound class: Clean

## 2011-12-16 MED ORDER — 0.9 % SODIUM CHLORIDE (POUR BTL) OPTIME
TOPICAL | Status: DC | PRN
  Administered 2011-12-16: 1000 mL

## 2011-12-16 MED ORDER — EPHEDRINE SULFATE 50 MG/ML IJ SOLN
INTRAMUSCULAR | Status: DC | PRN
  Administered 2011-12-16: 5 mg via INTRAVENOUS
  Administered 2011-12-16: 10 mg via INTRAVENOUS

## 2011-12-16 MED ORDER — MENTHOL 3 MG MT LOZG
1.0000 | LOZENGE | OROMUCOSAL | Status: DC | PRN
  Filled 2011-12-16: qty 9

## 2011-12-16 MED ORDER — SODIUM CHLORIDE 0.9 % IJ SOLN: 9.0000 mL | INTRAMUSCULAR | Status: DC | PRN

## 2011-12-16 MED ORDER — BUPIVACAINE HCL (PF) 0.5 % IJ SOLN
INTRAMUSCULAR | Status: AC
  Filled 2011-12-16: qty 30

## 2011-12-16 MED ORDER — HYDROCODONE-ACETAMINOPHEN 5-325 MG PO TABS
1.0000 | ORAL_TABLET | ORAL | Status: DC | PRN
  Administered 2011-12-18: 1 via ORAL
  Filled 2011-12-16: qty 1

## 2011-12-16 MED ORDER — ONDANSETRON HCL 4 MG/2ML IJ SOLN
INTRAMUSCULAR | Status: DC | PRN
  Administered 2011-12-16: 4 mg via INTRAVENOUS

## 2011-12-16 MED ORDER — ALUM & MAG HYDROXIDE-SIMETH 200-200-20 MG/5ML PO SUSP: 30.0000 mL | ORAL | Status: DC | PRN

## 2011-12-16 MED ORDER — MEPERIDINE HCL 25 MG/ML IJ SOLN: 6.2500 mg | INTRAMUSCULAR | Status: DC | PRN

## 2011-12-16 MED ORDER — PHENOL 1.4 % MT LIQD
1.0000 | OROMUCOSAL | Status: DC | PRN
  Filled 2011-12-16: qty 177

## 2011-12-16 MED ORDER — VANCOMYCIN HCL IN DEXTROSE 1-5 GM/200ML-% IV SOLN
INTRAVENOUS | Status: AC
  Filled 2011-12-16: qty 200

## 2011-12-16 MED ORDER — DIPHENHYDRAMINE HCL 50 MG/ML IJ SOLN: 12.5000 mg | Freq: Four times a day (QID) | INTRAMUSCULAR | Status: DC | PRN

## 2011-12-16 MED ORDER — DIPHENHYDRAMINE HCL 12.5 MG/5ML PO ELIX
12.5000 mg | ORAL_SOLUTION | Freq: Four times a day (QID) | ORAL | Status: DC | PRN
  Filled 2011-12-16: qty 5

## 2011-12-16 MED ORDER — LIDOCAINE HCL (CARDIAC) 20 MG/ML IV SOLN
INTRAVENOUS | Status: DC | PRN
  Administered 2011-12-16: 50 mg via INTRAVENOUS

## 2011-12-16 MED ORDER — ONDANSETRON HCL 4 MG/2ML IJ SOLN: 4.0000 mg | Freq: Four times a day (QID) | INTRAMUSCULAR | Status: DC | PRN

## 2011-12-16 MED ORDER — MORPHINE SULFATE (PF) 1 MG/ML IV SOLN
INTRAVENOUS | Status: DC
  Administered 2011-12-16 (×2): 1 mg via INTRAVENOUS
  Administered 2011-12-17: 2 mg via INTRAVENOUS
  Administered 2011-12-17: 3 mg via INTRAVENOUS
  Administered 2011-12-17: 2 mg via INTRAVENOUS

## 2011-12-16 MED ORDER — ONDANSETRON HCL 4 MG PO TABS: 4.0000 mg | ORAL_TABLET | Freq: Four times a day (QID) | ORAL | Status: DC | PRN

## 2011-12-16 MED ORDER — LACTATED RINGERS IV SOLN: INTRAVENOUS | Status: DC

## 2011-12-16 MED ORDER — METHOCARBAMOL 100 MG/ML IJ SOLN
500.0000 mg | Freq: Four times a day (QID) | INTRAVENOUS | Status: DC | PRN
  Filled 2011-12-16: qty 5

## 2011-12-16 MED ORDER — METOPROLOL SUCCINATE ER 50 MG PO TB24
50.0000 mg | ORAL_TABLET | Freq: Every day | ORAL | Status: DC
  Administered 2011-12-16 – 2011-12-19 (×3): 50 mg via ORAL
  Filled 2011-12-16 (×4): qty 1

## 2011-12-16 MED ORDER — ACETAMINOPHEN 10 MG/ML IV SOLN
INTRAVENOUS | Status: AC
  Filled 2011-12-16: qty 100

## 2011-12-16 MED ORDER — ACETAMINOPHEN 325 MG PO TABS: 650.0000 mg | ORAL_TABLET | Freq: Four times a day (QID) | ORAL | Status: DC | PRN

## 2011-12-16 MED ORDER — METOCLOPRAMIDE HCL 5 MG/ML IJ SOLN: 5.0000 mg | Freq: Three times a day (TID) | INTRAMUSCULAR | Status: DC | PRN

## 2011-12-16 MED ORDER — WARFARIN SODIUM 5 MG PO TABS
5.0000 mg | ORAL_TABLET | Freq: Once | ORAL | Status: AC
  Administered 2011-12-16: 5 mg via ORAL
  Filled 2011-12-16: qty 1

## 2011-12-16 MED ORDER — LACTATED RINGERS IV SOLN
INTRAVENOUS | Status: DC | PRN
  Administered 2011-12-16 (×2): via INTRAVENOUS

## 2011-12-16 MED ORDER — ENOXAPARIN SODIUM 30 MG/0.3ML ~~LOC~~ SOLN
30.0000 mg | Freq: Two times a day (BID) | SUBCUTANEOUS | Status: DC
  Administered 2011-12-17 – 2011-12-19 (×5): 30 mg via SUBCUTANEOUS
  Filled 2011-12-16 (×8): qty 0.3

## 2011-12-16 MED ORDER — METHOCARBAMOL 500 MG PO TABS
500.0000 mg | ORAL_TABLET | Freq: Four times a day (QID) | ORAL | Status: DC | PRN
  Administered 2011-12-19: 500 mg via ORAL
  Filled 2011-12-16: qty 1

## 2011-12-16 MED ORDER — SUCCINYLCHOLINE CHLORIDE 20 MG/ML IJ SOLN
INTRAMUSCULAR | Status: DC | PRN
  Administered 2011-12-16: 100 mg via INTRAVENOUS

## 2011-12-16 MED ORDER — POTASSIUM CHLORIDE IN NACL 20-0.9 MEQ/L-% IV SOLN
INTRAVENOUS | Status: DC
  Administered 2011-12-16 – 2011-12-17 (×3): via INTRAVENOUS
  Filled 2011-12-16 (×5): qty 1000

## 2011-12-16 MED ORDER — FERROUS SULFATE 325 (65 FE) MG PO TABS
325.0000 mg | ORAL_TABLET | Freq: Three times a day (TID) | ORAL | Status: DC
  Administered 2011-12-16 – 2011-12-19 (×9): 325 mg via ORAL
  Filled 2011-12-16 (×12): qty 1

## 2011-12-16 MED ORDER — FENTANYL CITRATE 0.05 MG/ML IJ SOLN
INTRAMUSCULAR | Status: DC | PRN
  Administered 2011-12-16: 50 ug via INTRAVENOUS
  Administered 2011-12-16 (×2): 100 ug via INTRAVENOUS

## 2011-12-16 MED ORDER — NALOXONE HCL 0.4 MG/ML IJ SOLN: 0.4000 mg | INTRAMUSCULAR | Status: DC | PRN

## 2011-12-16 MED ORDER — VANCOMYCIN HCL IN DEXTROSE 1-5 GM/200ML-% IV SOLN
1000.0000 mg | Freq: Two times a day (BID) | INTRAVENOUS | Status: AC
  Administered 2011-12-16: 1000 mg via INTRAVENOUS
  Filled 2011-12-16: qty 200

## 2011-12-16 MED ORDER — PROMETHAZINE HCL 25 MG/ML IJ SOLN: 6.2500 mg | INTRAMUSCULAR | Status: DC | PRN

## 2011-12-16 MED ORDER — HYDROMORPHONE HCL PF 1 MG/ML IJ SOLN: 0.2500 mg | INTRAMUSCULAR | Status: DC | PRN

## 2011-12-16 MED ORDER — METOCLOPRAMIDE HCL 10 MG PO TABS: 5.0000 mg | ORAL_TABLET | Freq: Three times a day (TID) | ORAL | Status: DC | PRN

## 2011-12-16 MED ORDER — PROPOFOL 10 MG/ML IV BOLUS
INTRAVENOUS | Status: DC | PRN
  Administered 2011-12-16: 100 mg via INTRAVENOUS

## 2011-12-16 MED ORDER — WARFARIN - PHARMACIST DOSING INPATIENT: Freq: Every day | Status: DC

## 2011-12-16 MED ORDER — ACETAMINOPHEN 650 MG RE SUPP: 650.0000 mg | Freq: Four times a day (QID) | RECTAL | Status: DC | PRN

## 2011-12-16 MED ORDER — ACETAMINOPHEN 10 MG/ML IV SOLN
INTRAVENOUS | Status: DC | PRN
  Administered 2011-12-16: 1000 mg via INTRAVENOUS

## 2011-12-16 MED ORDER — BUPIVACAINE HCL (PF) 0.5 % IJ SOLN
INTRAMUSCULAR | Status: DC | PRN
  Administered 2011-12-16: 20 mL

## 2011-12-16 MED ORDER — HYDROCODONE-ACETAMINOPHEN 5-325 MG PO TABS
1.0000 | ORAL_TABLET | ORAL | Status: DC | PRN
  Administered 2011-12-18 – 2011-12-19 (×3): 1 via ORAL
  Filled 2011-12-16 (×3): qty 1

## 2011-12-16 SURGICAL SUPPLY — 42 items
BAG ZIPLOCK 12X15 (MISCELLANEOUS) ×3 IMPLANT
BANDAGE ELASTIC 6 VELCRO ST LF (GAUZE/BANDAGES/DRESSINGS) ×3 IMPLANT
BANDAGE GAUZE ELAST BULKY 4 IN (GAUZE/BANDAGES/DRESSINGS) ×3 IMPLANT
BIT DRILL Q/COUPLING 1 (BIT) ×6 IMPLANT
BLADE SURG 15 STRL LF DISP TIS (BLADE) ×2 IMPLANT
BLADE SURG 15 STRL SS (BLADE) ×1
CLOTH BEACON ORANGE TIMEOUT ST (SAFETY) ×3 IMPLANT
DRAPE STERI IOBAN 125X83 (DRAPES) ×3 IMPLANT
DRSG MEPILEX BORDER 4X12 (GAUZE/BANDAGES/DRESSINGS) ×3 IMPLANT
DRSG PAD ABDOMINAL 8X10 ST (GAUZE/BANDAGES/DRESSINGS) ×3 IMPLANT
DRSG TEGADERM 4X4.75 (GAUZE/BANDAGES/DRESSINGS) ×3 IMPLANT
DURAPREP 26ML APPLICATOR (WOUND CARE) ×3 IMPLANT
ELECT REM PT RETURN 9FT ADLT (ELECTROSURGICAL) ×3
ELECTRODE REM PT RTRN 9FT ADLT (ELECTROSURGICAL) ×2 IMPLANT
GAUZE XEROFORM 1X8 LF (GAUZE/BANDAGES/DRESSINGS) ×3 IMPLANT
GLOVE ECLIPSE 8.5 STRL (GLOVE) ×6 IMPLANT
GOWN STRL REIN XL XLG (GOWN DISPOSABLE) ×3 IMPLANT
GUIDEPIN DHS/DCS (PIN) ×3 IMPLANT
KIT BASIN OR (CUSTOM PROCEDURE TRAY) ×3 IMPLANT
MANIFOLD NEPTUNE II (INSTRUMENTS) ×3 IMPLANT
NEEDLE HYPO 22GX1.5 SAFETY (NEEDLE) ×3 IMPLANT
PACK GENERAL/GYN (CUSTOM PROCEDURE TRAY) ×3 IMPLANT
PADDING CAST COTTON 6X4 STRL (CAST SUPPLIES) ×3 IMPLANT
PLATE DHS 135 DEG/4HOLE (Plate) ×3 IMPLANT
POSITIONER SURGICAL ARM (MISCELLANEOUS) ×3 IMPLANT
SCREW COMPRESS 36MM DHS/DCS (Orthopedic Implant) ×3 IMPLANT
SCREW CORTEX ST 4.5X30 (Screw) ×3 IMPLANT
SCREW CORTEX ST 4.5X34 (Screw) ×3 IMPLANT
SCREW CORTEX ST 4.5X40 (Screw) ×3 IMPLANT
SCREW DHS LAG 95MM (Screw) ×3 IMPLANT
SPONGE GAUZE 4X4 12PLY (GAUZE/BANDAGES/DRESSINGS) ×3 IMPLANT
SPONGE LAP 4X18 X RAY DECT (DISPOSABLE) IMPLANT
STAPLER VISISTAT (STAPLE) ×3 IMPLANT
STAPLER VISISTAT 35W (STAPLE) ×3 IMPLANT
SUT ETHILON 3 0 PS 1 (SUTURE) IMPLANT
SUT VIC AB 0 CT1 27 (SUTURE) ×1
SUT VIC AB 0 CT1 27XBRD ANTBC (SUTURE) ×2 IMPLANT
SUT VIC AB 1 CT1 36 (SUTURE) ×3 IMPLANT
SUT VIC AB 2-0 CT1 27 (SUTURE) ×2
SUT VIC AB 2-0 CT1 TAPERPNT 27 (SUTURE) ×4 IMPLANT
SYR CONTROL 10ML LL (SYRINGE) ×3 IMPLANT
TOWEL OR 17X26 10 PK STRL BLUE (TOWEL DISPOSABLE) ×6 IMPLANT

## 2011-12-16 NOTE — Anesthesia Preprocedure Evaluation (Addendum)
Anesthesia Evaluation  Patient identified by MRN, date of birth, ID band Patient awake    Reviewed: Allergy & Precautions, H&P , NPO status , Patient's Chart, lab work & pertinent test results  Airway Mallampati: III TM Distance: >3 FB Neck ROM: Full    Dental No notable dental hx.    Pulmonary neg pulmonary ROS,  breath sounds clear to auscultation  Pulmonary exam normal       Cardiovascular hypertension, Pt. on medications + CAD and + Past MI negative cardio ROS  Rhythm:Regular Rate:Normal     Neuro/Psych negative neurological ROS  negative psych ROS   GI/Hepatic negative GI ROS, Neg liver ROS,   Endo/Other  negative endocrine ROS  Renal/GU negative Renal ROS  negative genitourinary   Musculoskeletal negative musculoskeletal ROS (+)   Abdominal   Peds negative pediatric ROS (+)  Hematology negative hematology ROS (+)   Anesthesia Other Findings   Reproductive/Obstetrics negative OB ROS                          Anesthesia Physical Anesthesia Plan  ASA: III  Anesthesia Plan: General   Post-op Pain Management:    Induction: Intravenous  Airway Management Planned:   Additional Equipment:   Intra-op Plan:   Post-operative Plan: Extubation in OR  Informed Consent: I have reviewed the patients History and Physical, chart, labs and discussed the procedure including the risks, benefits and alternatives for the proposed anesthesia with the patient or authorized representative who has indicated his/her understanding and acceptance.   Dental advisory given  Plan Discussed with: CRNA  Anesthesia Plan Comments: (No SAB. plavix yesterday)       Anesthesia Quick Evaluation

## 2011-12-16 NOTE — Brief Op Note (Signed)
12/15/2011 - 12/16/2011  9:26 AM  PATIENT:  [name] [name]  78 y.o. female  PRE-OPERATIVE DIAGNOSIS:  right hip fracture, Intertrochanteric, 3 part.  POST-OPERATIVE DIAGNOSIS:  right hip fracture, Intertrochanteric, 3 part.  PROCEDURE:  Procedure(s) (LRB): COMPRESSION HIP (Right) Synthes, DHS 135 degree 4 hole plate with 95mm lag screw.  SURGEON:  Surgeon(s) and Role:    Kerrin Champagne, MD - Primary}   ANESTHESIA:   local and general Dr. Acey Lav  EBL:  Total I/O In: 1000 [I.V.:1000] Out: 225 [Urine:75; Blood:150]  BLOOD ADMINISTERED:none  DRAINS: Urinary Catheter (Foley)   LOCAL MEDICATIONS USED:  MARCAINE    and Amount: 10 ml  SPECIMEN:  No Specimen  DISPOSITION OF SPECIMEN:  N/A  COUNTS:  YES  TOURNIQUET:  * No tourniquets in log *  DICTATION: .Dragon Dictation  PLAN OF CARE: Admit to inpatient   PATIENT DISPOSITION:  PACU - hemodynamically stable.   Delay start of Pharmacological VTE agent (>24hrs) due to surgical blood loss or risk of bleeding: no

## 2011-12-16 NOTE — Preoperative (Signed)
Beta Blockers   Reason not to administer Beta Blockers:Held for potential hypotension associated with surgery - will monitor and give beta blocker if necessary.

## 2011-12-16 NOTE — Op Note (Addendum)
12/15/2011 - 12/16/2011  11:21 AM  PATIENT:  [name] [name]  78 y.o. female  MRN: [id number]  OPERATIVE REPORT  PRE-OPERATIVE DIAGNOSIS:  right hip fracture, intertrochanteric, closed 2 part.  POST-OPERATIVE DIAGNOSIS:  right hip fracture Intertrochanteric, closed 2 part.  PROCEDURE:  Procedure(s): COMPRESSION HIP Synthes DHS 135 degree 4 hole plate with 95 mm lag screw. Open reduction internal fixation of right intertrochanteric hip fracture.    SURGEON:  Kerrin Champagne, MD     ANESTHESIA:  General, Dr. Acey Lav    COMPLICATIONS:  None.    PROCEDURE:The patient was met in the holding area, and the appropriate right hipidentified and marked with an "X" and my initials. The patient was then transported to OR and was placed under anesthesia in supine position in his bed then transferred to the [facility] fracture table.  The patient received appropriate preoperative antibiotic prophylaxis ancef. The right foot and ankle well padded placed into longitudinal traction with leg brought into adduction. A groin post was used and the left legced in the well leg holder abducted to allow for C-arm evaluation of the right hip. The right lower extremity was then prepped circumferentially about the right  knee thigh hip to the lower rib margin laterally using DuraPrep solution. A iodine exclusion Vi-Drape was used for this case. C-arm fluoroscopy was used to ascertain reduction of the right intertrochanteric hip fracture using abduction longitudinal traction and then internal rotation. Fracture reduced nicely into anatomic position and alignment. Following standard timeout protocol, then an incision was made using C-arm fluoroscopy guidance approximately  12 centimeters in length from area just distal to the greater trochanter inferior flare. Then was carried through the tensor fascia in length retaining retractors were placed. Blunt dissection with cobb after incision of the vastus lateralis exposing the  proximal lateral femur. A guide pin for proximal lag screw was inserted with 135 angle guide into the inferior one half and posterior half of the femoral head and neck about 3 mm short of subchondral bone. The depth gauge used to ascertain the depth of the pin at about 103 mm to a 95 mm length lag screw was chosen and step cut reamer adjusted for 95 mm. Reaming was  carried out to about 95 mm under direct C-arm fluoroscopy this provided for a reaming to approximately 3 mm short of subchondral bone. With this then 95 mm lag screw was passed over the guide pin and carefully screwed into place down to subchondral bone and off of the subchondral bone about 2 mm to 3 mm the handle placed parallel to the femoral shaft.the 4 hole 135 sideplate was then placed over the lag screw and impacted against the lateral cortex. Successive drill holes were then placed measured for depth and appropriate size screw placed. Note that the lag screw was then carefully compressed after releasing longitudinal traction on the leg impacting the fracture site quite nicely and providing compression across the lag screw fix this nail interface this was done using the compression screw and the screw was removed. Permanent C-arm images were obtained in AP lateral planes the proximal fixation and fracture site. The  incision site was closed by irrigating first and approximating the vastus lateralis superficial fascia with a running 0 Vicryl suture. The tensor fascia lata with interrupted #1 Vicryl sutures. Deep subcutaneous layers were approximated with #1 Vicryl suture and the subcutaneous layers with 2-0 Vicryl.  Skin was then closed with stainless steel staples. Mepilex small bandage then applied to the  lateral thigh incision site. All instrument and sponge counts were correct. Patient was then removed from longitudinal traction returned to her bed reactivated, extubated and returned to recovery room in satisfactory condition.   Madiha Bambrick  E 12/16/2011, 11:21 AM

## 2011-12-16 NOTE — Transfer of Care (Signed)
Immediate Anesthesia Transfer of Care Note  Patient: [name] [name]  Procedure(s) Performed: Procedure(s) (LRB): COMPRESSION HIP (Right)  Patient Location: PACU  Anesthesia Type: General  Level of Consciousness: awake, sedated and patient cooperative  Airway & Oxygen Therapy: Patient Spontanous Breathing and Patient connected to face mask oxygen  Post-op Assessment: Report given to PACU RN and Post -op Vital signs reviewed and stable  Post vital signs: Reviewed and stable  Complications: No apparent anesthesia complications

## 2011-12-16 NOTE — H&P (Signed)
Patient was seen and examined in the preop holding area. There has been no interval  Change in this patient's exam preop  history and physical exam  Lab tests and images have been examined and reviewed.  The Risks benefits and alternative treatments have been discussed  extensively,questions answered.  The patient has elected to undergo the discussed surgical treatment. 

## 2011-12-16 NOTE — Anesthesia Postprocedure Evaluation (Signed)
  Anesthesia Post-op Note  Patient: [name] [name]  Procedure(s) Performed: Procedure(s) (LRB): COMPRESSION HIP (Right)  Patient Location: PACU  Anesthesia Type: General  Level of Consciousness: awake and alert   Airway and Oxygen Therapy: Patient Spontanous Breathing  Post-op Pain: mild  Post-op Assessment: Post-op Vital signs reviewed, Patient's Cardiovascular Status Stable, Respiratory Function Stable, Patent Airway and No signs of Nausea or vomiting  Post-op Vital Signs: stable  Complications: No apparent anesthesia complications

## 2011-12-16 NOTE — Progress Notes (Addendum)
ANTICOAGULATION CONSULT NOTE - Initial Consult  Pharmacy Consult for Warfarin Indication: VTE prophylaxis  Allergies  Allergen Reactions  . Penicillins     REACTION: swelling    Patient Measurements: Height: 5\' 3"  (160 cm) Weight: 154 lb (69.854 kg) IBW/kg (Calculated) : 52.4   Vital Signs: Temp: 97.4 F (36.3 C) (05/11 1037) BP: 140/74 mmHg (05/11 1037) Pulse Rate: 73  (05/11 0638)  Labs:  Basename 12/16/11 0410 12/15/11 1550  HGB 10.6* 12.3  HCT 30.8* 35.5*  PLT 261 267  APTT -- --  LABPROT -- --  INR -- --  HEPARINUNFRC -- --  CREATININE -- 1.01  CKTOTAL -- --  CKMB -- --  TROPONINI -- --    Estimated Creatinine Clearance: 41 ml/min (by C-G formula based on Cr of 1.01).   Medical History: Past Medical History  Diagnosis Date  . CAD (coronary artery disease)   . HTN (hypertension)   . Hyperlipidemia   . MI (myocardial infarction) 2002    Assessment: [name] [name] s/p right hip fracture repair 5/11 to begin coumadin for VTE prophylaxis.  No INR at baseline.  Patient characteristics (female, weight, age) suggest starting at low warfarin dose.  Patient is also to begin Lovenox 30 mg SQ q12h until INR >/= 1.8.  Goal of Therapy:  INR 2-3   Plan:  INR STAT then will f/u with dose. Daily INR.  Clance Boll 12/16/2011,10:45 AM   Addendum 12/16/2011 11:51 AM INR returned at 1.18 Plan: Warfarin 5 mg po once today.  Follow up INR in AM.  Clance Boll, PharmD, BCPS Pager: [phone] 12/16/2011 11:51 AM

## 2011-12-16 NOTE — Discharge Instructions (Signed)
RESOURCE GUIDE  Dental Problems  Patients with Medicaid: [facility] [phone]                                           [phone] Phone:  [phone]                                                   Phone:  [phone]  If unable to pay or uninsured, contact:  [facility] or [facility] to become qualified for the adult dental clinic.  Chronic Pain Problems  Hip Fracture (Upper Femoral Fracture) You have a hip fracture (break in bone). This is a fracture of the upper part of the big bone (femur, thigh bone) between your hip and knee. If your caregiver feels it is a stable fracture, occasionally it can be treated without surgery. Usually these fractures are unstable. This means that the bones will not heal properly without surgery. Surgery is necessary to hold the bones together in a good position where they will heal well. DIAGNOSIS A physical exam can determine if a fracture has occurred. X-ray studies are needed to see what type of fracture is present and to look for other injuries. These studies will help your caregiver determine what the best treatment is for you. If there is more than one option, your caregiver can give you the information needed to help you decide on the treatment. TREATMENT  The treatment for an unstable fracture is usually surgery. This means using a screw, nail, or rod to hold the bones in place.  RISKS AND COMPLICATIONS All surgery is associated with risks. Sometimes the implant may fail. Other complications of surgery include infection or the bones not healing properly. Sometimes the fracture may damage the blood supply to the head of the femur. That portion of bone may die (osteonecrosis or avascular necrosis). Sometimes to avoid this complication, an implant is used which just replaces the ball of the femur (hemi-arthroplasty or prosthetic replacement). Some of the other risks  are:  Excessive bleeding.   Infection.   Dislocation if a hemi-arthroplasty or a total hip was inserted.   Failure to heal properly resulting in an unstable hip.   Stiffness of hip following repair.   On occasion, blood may have to be replaced before or during the procedure  LET YOUR CAREGIVERS KNOW ABOUT:  Allergies.   Medications taken including herbs, eye drops, over the counter medications, and creams.   Use of steroids (by mouth or creams).   History of bleeding or blood problems.   History of serious infection.   Previous problems with anesthetics or novocaine.   Possibility of pregnancy, if this applies.   History of blood clots (thrombophlebitis).   Previous surgery.   Other health problems.  BEFORE THE PROCEDURE Before surgery, an IV (intravenous line connected to your vein) may be started. You will be given an anesthetic (medications and gas to make you sleep) or given medications in your back to make you numb from the waist down (spinal anesthetic). AFTER THE PROCEDURE After surgery, you  will be taken to the recovery area where a nurse will watch your progress. You may have a catheter (a long, narrow, hollow tube) in your bladder that helps you pass your water. Once you're awake, stable, and taking fluids well, you will be returned to your room. You will receive physical therapy and other care until you are doing well and your caregiver feels it is safe for you to be transferred either to home or to an extended care facility. Your activity level will change as your caregiver determines what is best for you.  You may resume normal diet and activities as directed or allowed.   Change dressings if necessary or as directed.   Only take over-the-counter or prescription medicines for pain, discomfort, or fever as directed by your caregiver.   You may be placed on blood thinners for 4-6 weeks to prevent blood clots.  SEEK IMMEDIATE MEDICAL CARE IF:  There is  swelling of your calf or leg.   You have shortness of breath or chest pain.   There is redness, swelling, or increasing pain in the wound.   There is pus coming from wound.   You have an unexplained oral temperature above 102 F (38.9 C).   There is a foul (bad) smell coming from the wound or dressing.   There is a breaking open of the wound (edges not staying together) after sutures or staples have been removed.   There is a marked increase in pain or shortening of the leg.   You have severe pain anywhere in the leg.   There is any change in color or temperature of your leg below the injury.  MAKE SURE YOU:   Understand these instructions.   Will watch your condition.   Will get help right away if you are not doing well or get worse.  Document Released: 07/24/2005 Document Revised: 07/13/2011 Document Reviewed: 03/13/2008 [facility] Patient Information 2012 [place], [place]. Keep dressing dry. May use tub chair to shower  Call if there is odor or saturation of dressing or worsening pain not controlled with medications. Call if fever greater than 101.5. Use crutches or walker 50% weight bearing on the right leg. Please follow up with an appointment with Dr. [name]  2 weeks from the time of surgery [phone]. Elevate as often as possible during the first week after surgery gradually increasing the time the leg is dependent or down there after. If swelling recurrs then elevate again. Wheel chair for longer distances.Apply ice to the surgery site as needed to relieve pain. Take aspirin 325 mg every day with food or snack

## 2011-12-17 LAB — CBC
HCT: 21.5 % — ABNORMAL LOW (ref 36.0–46.0)
Hemoglobin: 7.3 g/dL — ABNORMAL LOW (ref 12.0–15.0)
MCHC: 34 g/dL (ref 30.0–36.0)
MCV: 85 fL (ref 78.0–100.0)
RDW: 13.3 % (ref 11.5–15.5)
WBC: 8.2 10*3/uL (ref 4.0–10.5)

## 2011-12-17 LAB — BASIC METABOLIC PANEL
BUN: 11 mg/dL (ref 6–23)
Chloride: 94 mEq/L — ABNORMAL LOW (ref 96–112)
Creatinine, Ser: 0.93 mg/dL (ref 0.50–1.10)
GFR calc Af Amer: 65 mL/min — ABNORMAL LOW (ref >90)
Glucose, Bld: 119 mg/dL — ABNORMAL HIGH (ref 70–99)

## 2011-12-17 MED ORDER — ACETAMINOPHEN 325 MG PO TABS
650.0000 mg | ORAL_TABLET | Freq: Once | ORAL | Status: AC
  Administered 2011-12-17: 650 mg via ORAL
  Filled 2011-12-17: qty 2

## 2011-12-17 MED ORDER — WARFARIN SODIUM 5 MG PO TABS
5.0000 mg | ORAL_TABLET | Freq: Once | ORAL | Status: AC
  Administered 2011-12-17: 5 mg via ORAL
  Filled 2011-12-17: qty 1

## 2011-12-17 MED ORDER — DIPHENHYDRAMINE HCL 25 MG PO CAPS
25.0000 mg | ORAL_CAPSULE | Freq: Once | ORAL | Status: AC
  Administered 2011-12-17: 25 mg via ORAL
  Filled 2011-12-17: qty 1

## 2011-12-17 MED ORDER — FUROSEMIDE 10 MG/ML IJ SOLN
20.0000 mg | Freq: Once | INTRAMUSCULAR | Status: AC
  Administered 2011-12-17: 20 mg via INTRAVENOUS
  Filled 2011-12-17: qty 2

## 2011-12-17 NOTE — Progress Notes (Signed)
Clinical Social Work Department CLINICAL SOCIAL WORK PLACEMENT NOTE 12/17/2011  Patient:  [name] [name],[name] [name]  Account Number:  [id number] Admit date:  12/15/2011  Clinical Social Worker:  Leron Croak, CLINICAL SOCIAL WORKER  Date/time:  12/17/2011 04:42 PM  Clinical Social Work is seeking post-discharge placement for this patient at the following level of care:   SKILLED NURSING   (*CSW will update this form in Epic as items are completed)   12/17/2011  Patient/family provided with [facility] Department of Clinical Social Work's list of facilities offering this level of care within the geographic area requested by the patient (or if unable, by the patient's family).  12/17/2011  Patient/family informed of their freedom to choose among providers that offer the needed level of care, that participate in Medicare, Medicaid or managed care program needed by the patient, have an available bed and are willing to accept the patient.  12/17/2011  Patient/family informed of [facility]' ownership interest in [facility], as well as of the fact that they are under no obligation to receive care at this facility.  PASARR submitted to EDS on 12/17/2011 PASARR number received from EDS on   FL2 transmitted to all facilities in geographic area requested by pt/family on   FL2 transmitted to all facilities within larger geographic area on   Patient informed that his/her managed care company has contracts with or will negotiate with  certain facilities, including the following:     Patient/family informed of bed offers received:   Patient chooses bed at  Physician recommends and patient chooses bed at    Patient to be transferred to  on   Patient to be transferred to facility by   The following physician request were entered in Epic:   Additional Comments:  Weekday CSW to f/u with [facility].   Leron Croak, LCSWA [employer] Coverage [phone]

## 2011-12-17 NOTE — Evaluation (Signed)
Physical Therapy Evaluation Patient Details Name: [name] [name] MRN: [id number] DOB: 20-Jun-1928 Today's Date: 12/17/2011 Time: 9528-4132 PT Time Calculation (min): 41 min  PT Assessment / Plan / Recommendation Clinical Impression  Pt presents s/p hemi arthroplasty following an intertrochanteric fracture after sustaining a fall at home.  Pt tolerated sitting EOB with slight c/o dizziness.  BP read 97/64, therefore continued to attempt standing.  Upon standing, pt stated that she was dizzy but wanted to get to chair, however pt became unresponsive to verbal commands and was returned to sitting and immediately assisted into supine position.  Upon being supine, BP was 94/60 and pt was able to respond and verbalize with therapy.  RN notified. Pt will benefit from skilled PT in acute venue to address deficits.  Pt will likely require short term SNF placement to return pt to PLOF and increase pt safety, however pt hesitant and wants to return home.  Pt verbalized understanding that short term SNF may be safest option.       PT Assessment  Patient needs continued PT services    Follow Up Recommendations  Home health PT;Skilled nursing facility    Barriers to Discharge Decreased caregiver support      lEquipment Recommendations  Defer to next venue (will need RW if home D/C. )    Recommendations for Other Services OT consult   Frequency Min 4X/week    Precautions / Restrictions Precautions Precautions: Fall Restrictions Weight Bearing Restrictions: Yes RLE Weight Bearing: Partial weight bearing RLE Partial Weight Bearing Percentage or Pounds: 50%   Pertinent Vitals/Pain Pt very symptomatic and noted low HGB this am.  RN and MD aware of session details.       Mobility  Bed Mobility Bed Mobility: Supine to Sit;Sitting - Scoot to [place] of Bed;Sit to Supine Supine to Sit: 1: +2 Total assist Supine to Sit: Patient Percentage: 20% Sitting - Scoot to Edge of Bed: 1: +2 Total assist Sitting -  Scoot to Edge of Bed: Patient Percentage: 20% Sit to Supine: 1: +2 Total assist Sit to Supine: Patient Percentage: 0% Details for Bed Mobility Assistance: Assist for B LE off of bed with pt able to assist moving LLE to EOB, assist for trunk to attain sitting with cues for hand placement for pt to self assist trunk.   Transfers Transfers: Sit to Stand;Stand to Sit Sit to Stand: 1: +2 Total assist;With upper extremity assist;From bed Sit to Stand: Patient Percentage: 30% Stand to Sit: 1: +2 Total assist;To bed Stand to Sit: Patient Percentage: 0% Details for Transfer Assistance: Upon standing, pt stated that she felt dizzy but wanted to try and get to the chair, however pt then became unresponsive to verbal commands, therefore pt was returned to sitting and immediately assisted into supine position with HOB flat.  Upon pt getting to supine position, pt able to respond to commands.  BP 94/60 once in supine.  Unable to obtain BP in standing.  Ambulation/Gait Ambulation/Gait Assistance: Not tested (comment) Assistive device: Rolling walker Stairs: No Wheelchair Mobility Wheelchair Mobility: No    Exercises     PT Diagnosis: Difficulty walking;Generalized weakness;Acute pain  PT Problem List: Decreased strength;Decreased activity tolerance;Decreased balance;Decreased mobility;Decreased coordination;Decreased knowledge of use of DME;Decreased knowledge of precautions;Pain PT Treatment Interventions: DME instruction;Gait training;Functional mobility training;Therapeutic activities;Therapeutic exercise;Balance training;Patient/family education   PT Goals Acute Rehab PT Goals PT Goal Formulation: With patient Time For Goal Achievement: 12/25/11 Potential to Achieve Goals: Fair Pt will go Supine/Side to Sit: with mod assist;with  min assist PT Goal: Supine/Side to Sit - Progress: Goal set today Pt will go Sit to Supine/Side: with min assist;with mod assist PT Goal: Sit to Supine/Side - Progress:  Goal set today Pt will go Sit to Stand: with supervision PT Goal: Sit to Stand - Progress: Goal set today Pt will go Stand to Sit: with supervision PT Goal: Stand to Sit - Progress: Goal set today Pt will Transfer Bed to Chair/Chair to Bed: with min assist;with mod assist PT Transfer Goal: Bed to Chair/Chair to Bed - Progress: Goal set today Pt will Ambulate: 16 - 50 feet;with min assist;with mod assist;with least restrictive assistive device PT Goal: Ambulate - Progress: Goal set today Pt will Perform Home Exercise Program: with supervision, verbal cues required/provided PT Goal: Perform Home Exercise Program - Progress: Goal set today  Visit Information  Last PT Received On: 12/17/11 Assistance Needed: +2    Subjective Data  Subjective: I don't want to get up but I will try Patient Stated Goal: To go home, not rehab, but I will go to rehab if I need to   Prior Functioning  Home Living Lives With: Alone (neice helps out) Type of Home: House Home Access: Level entry Home Layout: One level Bathroom Shower/Tub: Health visitor: Handicapped height Home Adaptive Equipment: Straight cane;Walker - rolling;Bedside commode/3-in-1 Prior Function Level of Independence: Independent Able to Take Stairs?: Yes Driving: Yes Vocation: Retired Musician: Clinical cytogeneticist  Overall Cognitive Status: Appears within functional limits for tasks assessed/performed Arousal/Alertness: Awake/alert Orientation Level: Appears intact for tasks assessed Behavior During Session: [facility] for tasks performed    Extremity/Trunk Assessment Right Lower Extremity Assessment RLE ROM/Strength/Tone: Unable to fully assess;Due to pain;Due to precautions RLE Sensation: WFL - Light Touch RLE Coordination: WFL - gross motor Left Lower Extremity Assessment LLE ROM/Strength/Tone: WFL for tasks assessed LLE Sensation: WFL - Light Touch LLE Coordination: WFL - gross motor Trunk  Assessment Trunk Assessment: Kyphotic   Balance    End of Session PT - End of Session Equipment Utilized During Treatment: Gait belt Activity Tolerance: Patient limited by pain;Other (comment) (Limited by dizziness) Patient left: in bed;with call bell/phone within reach;with nursing in room Nurse Communication: Mobility status;Other (comment) (notified of unresponsive episode)   Page, Meribeth Mattes 12/17/2011, 12:17 PM

## 2011-12-17 NOTE — Progress Notes (Signed)
Subjective: 1 Day Post-Op Procedure(s) (LRB): COMPRESSION HIP (Right) Pain is better than prior to surgery.  Patient reports pain as mild.    Objective:   VITALS:  Temp:  [97.3 F (36.3 C)-98.4 F (36.9 C)] 97.7 F (36.5 C) (05/12 0522) Pulse Rate:  [69-85] 69  (05/12 0522) Resp:  [14-18] 18  (05/12 0735) BP: (105-141)/(62-74) 105/70 mmHg (05/12 0522) SpO2:  [98 %-100 %] 100 % (05/12 0735)  Neurologically intact ABD soft Neurovascular intact Intact pulses distally Incision: moderate drainage No cellulitis present   LABS  Basename 12/17/11 0504 12/16/11 1753 12/16/11 0410 12/15/11 1550  HGB 7.3* 8.2* 10.6* 12.3  WBC 8.2 12.4* -- --  PLT 223 222 -- --    Basename 12/17/11 0504 12/15/11 1550  NA 126* 132*  K 4.3 3.8  CL 94* 95*  CO2 26 26  BUN 11 15  CREATININE 0.93 1.01  GLUCOSE 119* 130*    Basename 12/17/11 0504 12/16/11 1105  LABPT -- --  INR 1.31 1.18     Assessment/Plan: 1 Day Post-Op Procedure(s) (LRB): COMPRESSION HIP (Right)  Advance diet Up with therapy Continue foley due to strict I&O and urinary output monitoring  Dacota Devall E 12/17/2011, 8:53 AM

## 2011-12-17 NOTE — Progress Notes (Signed)
ANTICOAGULATION CONSULT NOTE - Follow Up Consult  Pharmacy Consult for Warfarin Indication: VTE prophylaxis  Allergies  Allergen Reactions  . Penicillins     REACTION: swelling    Patient Measurements: Height: 5\' 3"  (160 cm) Weight: 154 lb (69.854 kg) IBW/kg (Calculated) : 52.4   Vital Signs: Temp: 97.7 F (36.5 C) (05/12 0522) Temp src: Oral (05/12 0522) BP: 105/70 mmHg (05/12 0522) Pulse Rate: 69  (05/12 0522)  Labs:  Basename 12/17/11 0504 12/16/11 1753 12/16/11 1105 12/16/11 0410 12/15/11 1550  HGB 7.3* 8.2* -- -- --  HCT 21.5* 23.1* -- 30.8* --  PLT 223 222 -- 261 --  APTT -- -- -- -- --  LABPROT 16.5* -- 15.2 -- --  INR 1.31 -- 1.18 -- --  HEPARINUNFRC -- -- -- -- --  CREATININE 0.93 -- -- -- 1.01  CKTOTAL -- -- -- -- --  CKMB -- -- -- -- --  TROPONINI -- -- -- -- --    Estimated Creatinine Clearance: 44.5 ml/min (by C-G formula based on Cr of 0.93).   Assessment:  48 YOF s/p right hip fracture repair 5/11 to begin coumadin for VTE prophylaxis.   Patient characteristics (female, weight, age) suggest starting at low warfarin dose.   Patient is also on Lovenox 30 mg SQ q12h until INR >/= 1.8.  INR responded to 5 mg dose.  Will repeat 5 mg for another day.  H/H low following surgery, monitoring.  No bleeding/complications reported.  Goal of Therapy:  INR 2-3   Plan:  Warfarin 5 mg po once today. Follow up INR in AM.   Clance Boll 12/17/2011,7:35 AM

## 2011-12-17 NOTE — Progress Notes (Signed)
Patient with symptomatic hypotension due to anemia from Fracture and peri operative bleeding. Preop Hct 30 no at 21% Will transfuse 2 units of blood. Recheck CBC in AM. Cancel CBC scheduled for this aftenoon.

## 2011-12-17 NOTE — Progress Notes (Signed)
Clinical Social Work Department BRIEF PSYCHOSOCIAL ASSESSMENT 12/17/2011  Patient:  [name] [name],[name] [name]     Account Number:  [id number]     Admit date:  12/15/2011  Clinical Social Worker:  Leron Croak, CLINICAL SOCIAL WORKER  Date/Time:  12/17/2011 04:31 PM  Referred by:  Physician  Date Referred:  12/17/2011 Referred for  SNF Placement   Other Referral:   Interview type:  Patient Other interview type:   Family was also with Pt and was wanting SNF because lives alone.    PSYCHOSOCIAL DATA Living Status:  ALONE Admitted from facility:   Level of care:   Primary support name:  Alfonso Patten Primary support relationship to patient:  FAMILY Degree of support available:   good    CURRENT CONCERNS Current Concerns  Other - See comment   Other Concerns:   Pt lives alone, Niece does not feel Pt is strong enough to go home alone. Pt states she is fine with Home Health Rehabilitation but will consider placement options.    SOCIAL WORK ASSESSMENT / PLAN CSW spoke with Family/Pt to discuss d/c plan to SNF for Rehabilitation.   Assessment/plan status:  Information/Referral to [facility] Other assessment/ plan:   Pt lives alone, Niece does not feel Pt is strong enough to go home alone. Pt states she is fine with Home Health Rehabilitation but will consider placement options   Information/referral to community resources:   Family had already received [facility] listing and decided that their first options would be [facility], [facility], [facility], [facility] and [facility]. Pt's first choice would be [facility].    PATIENT'S/FAMILY'S RESPONSE TO PLAN OF CARE: Pt/Family expressed appreciation for referal information.        Leron Croak, LCSWA [employer] [phone]

## 2011-12-18 DIAGNOSIS — D62 Acute posthemorrhagic anemia: Secondary | ICD-10-CM | POA: Diagnosis not present

## 2011-12-18 DIAGNOSIS — E871 Hypo-osmolality and hyponatremia: Secondary | ICD-10-CM | POA: Diagnosis not present

## 2011-12-18 LAB — TYPE AND SCREEN: Unit division: 0

## 2011-12-18 LAB — BASIC METABOLIC PANEL WITH GFR
BUN: 14 mg/dL (ref 6–23)
CO2: 24 meq/L (ref 19–32)
Calcium: 8.8 mg/dL (ref 8.4–10.5)
Chloride: 95 meq/L — ABNORMAL LOW (ref 96–112)
Creatinine, Ser: 0.89 mg/dL (ref 0.50–1.10)
GFR calc Af Amer: 69 mL/min — ABNORMAL LOW (ref >90)
GFR calc non Af Amer: 59 mL/min — ABNORMAL LOW (ref >90)
Glucose, Bld: 119 mg/dL — ABNORMAL HIGH (ref 70–99)
Potassium: 4 meq/L (ref 3.5–5.1)
Sodium: 129 meq/L — ABNORMAL LOW (ref 135–145)

## 2011-12-18 LAB — CBC
HCT: 26.9 % — ABNORMAL LOW (ref 36.0–46.0)
Hemoglobin: 9.4 g/dL — ABNORMAL LOW (ref 12.0–15.0)
MCH: 29.2 pg (ref 26.0–34.0)
MCHC: 34.9 g/dL (ref 30.0–36.0)
MCV: 83.5 fL (ref 78.0–100.0)
Platelets: 187 K/uL (ref 150–400)
RBC: 3.22 MIL/uL — ABNORMAL LOW (ref 3.87–5.11)
RDW: 14.8 % (ref 11.5–15.5)
WBC: 9.4 K/uL (ref 4.0–10.5)

## 2011-12-18 LAB — PROTIME-INR
INR: 1.57 — ABNORMAL HIGH (ref 0.00–1.49)
Prothrombin Time: 19.1 s — ABNORMAL HIGH (ref 11.6–15.2)

## 2011-12-18 MED ORDER — WARFARIN SODIUM 5 MG PO TABS
5.0000 mg | ORAL_TABLET | Freq: Once | ORAL | Status: AC
  Administered 2011-12-18: 5 mg via ORAL
  Filled 2011-12-18: qty 1

## 2011-12-18 NOTE — Progress Notes (Signed)
Thank you for consult on [name] [name].  Chart reviewed. Spoke with patient and grand niece.  Patient lives alone and does not have 24 hours supervision.  Was limited before due to arthritis and now has PWB restrictions.  Patient and family want rehab for 6-8 weeks then discharge to home at independent level.  She's agreeable to bed offer at [facility] Will defer formal consult.

## 2011-12-18 NOTE — Progress Notes (Signed)
Utilization review completed.  

## 2011-12-18 NOTE — Progress Notes (Addendum)
Subjective: 2 Days Post-Op Procedure(s) (LRB): COMPRESSION HIP (Right)Right leg is better, awake alert and oriented x 4. Foley discontinued. Patient reports pain as mild.  Transfused 2 units of blood yesterday due to symptomatic anemia. Peri operative.  Objective:   VITALS:  Temp:  [98.6 F (37 C)-99.3 F (37.4 C)] 98.8 F (37.1 C) (05/13 0451) Pulse Rate:  [79-97] 86  (05/13 0953) Resp:  [16-18] 16  (05/13 0451) BP: (94-141)/(57-90) 124/74 mmHg (05/13 0953) SpO2:  [94 %-100 %] 95 % (05/13 0451)  Neurologically intact ABD soft Dorsiflexion/Plantar flexion intact Incision: moderate drainage   LABS  Basename 12/18/11 0421 12/17/11 0504 12/16/11 1753 12/16/11 0410 12/15/11 1550  HGB 9.4* 7.3* 8.2* 10.6* 12.3  WBC 9.4 8.2 -- -- --  PLT 187 223 -- -- --    Basename 12/18/11 0421 12/17/11 0504  NA 129* 126*  K 4.0 4.3  CL 95* 94*  CO2 24 26  BUN 14 11  CREATININE 0.89 0.93  GLUCOSE 119* 119*    Basename 12/18/11 0421 12/17/11 0504  LABPT -- --  INR 1.57* 1.31     Assessment/Plan: 2 Days Post-Op Procedure(s) (LRB): COMPRESSION HIP (Right) Acute blood loss anemia peri operative and secondary to Fracture, aspirin and plavix use. Hyponatremia due to hypovolemia perioperatively.   Advance diet Up with therapy D/C IV fluids Discharge to SNF is being undertaken. FL-2 form signed and discharge summary completed.  Autrey Human E 12/18/2011, 11:35 AM

## 2011-12-18 NOTE — Clinical Documentation Improvement (Signed)
Anemia Blood Loss Clarification  THIS DOCUMENT IS NOT A PERMANENT PART OF THE MEDICAL RECORD  RESPOND TO THE THIS QUERY, FOLLOW THE INSTRUCTIONS BELOW:  1. If needed, update documentation for the patient's encounter via the notes activity.  2. Access this query again and click edit on the In [employer].  3. After updating, or not, click F2 to complete all highlighted (required) fields concerning your review. Select "additional documentation in the medical record" OR "no additional documentation provided".  4. Click Sign note button.  5. The deficiency will fall out of your In Basket *Please let us know if you are not able to complete this workflow by phone or e-mail (listed below).        12/18/11  Dear Dr. [name], [name] [name] [name]  In an effort to better capture your patient's severity of illness, reflect appropriate length of stay and utilization of resources, a review of the patient medical record has revealed the following indicators.    Based on your clinical judgment, please clarify and document in a progress note and/or discharge summary the clinical condition associated with the following supporting information:  In responding to this query please exercise your independent judgment.  The fact that a query is asked, does not imply that any particular answer is desired or expected.  Pt with right closed impacted intertrochanteric hip fracture  According to pn [id number] pt with symptomatic hypotension d/t anemia.  Please clarify  whether or not anemia can be further specified as one of the diagnoses listed below and document in pn or d/c summary.  Possible Clinical Conditions?   " Expected Acute Blood Loss Anemia  " Acute Blood Loss Anemia  " Acute on chronic blood loss anemia   " Other Condition________________  " Cannot Clinically Determine  Risk Factors: (recent surgery, pre op anemia, EBL in OR)  Supporting Information:  Signs and Symptoms PN 12/17/11 Patient with  symptomatic hypotension due to anemia from Fracture and peri operative bleeding. Preop Hct 30 no at 21% Will transfuse 2 units of blood. Recheck CBC in AM. Cancel CBC scheduled for this aftenoon   Diagnostics: Component Hemoglobin HCT  Latest Ref Rng 12.0 - 15.0 g/dL 19.1 - 47.8 %  2/95/6213 8.2 (L) 23.1 (L)  12/17/2011 7.3 (L) 21.5 (L)  12/18/2011 9.4 (L) 26.9 (L)   Treatments Transfusion: 12/17/11 2 U RBCs Serial H&H monitoring Medications ferrous sulfate tablet 325 mg      Reviewed: additional documentation in the medical record Please see todays note and update of problem list. Thank You,  Enis Slipper  RN, BSN, CCDS Clinical Documentation Specialist [facility] Dept Pager: [phone] / E-mail: [email or web address]  Health Information Management 

## 2011-12-18 NOTE — Discharge Summary (Addendum)
Physician Discharge Summary  Patient ID: [name] [name] MRN: [id number] DOB/AGE: 04-14-1928 78 y.o.  Admit date: 12/15/2011 Discharge date:   Admission Diagnoses:  Principal Problem:  *Hip fracture, intertrochanteric, right, closed, initial encounter Active Problems:  Postoperative anemia due to acute blood loss  Hyponatremia with excess extracellular fluid volume   Discharge Diagnoses:  Same  Past Medical History  Diagnosis Date  . CAD (coronary artery disease)   . HTN (hypertension)   . Hyperlipidemia   . MI (myocardial infarction) 2002    Surgeries: Procedure(s): COMPRESSION HIP on 12/15/2011 - 12/16/2011   Consultants:    Discharged Condition: Improved  Hospital Course: Abiola Behring is an 78 y.o. female who was admitted 12/15/2011 with a chief complaint of  Chief Complaint  Patient presents with  . Fall  , and found to have a diagnosis of Hip fracture, intertrochanteric, right, closed, initial encounter.  They were brought to the operating room on 12/15/2011 - 12/16/2011 and underwent the above named procedures.    They were given perioperative antibiotics:  Anti-infectives     Start     Dose/Rate Route Frequency Ordered Stop   12/16/11 1000   vancomycin (VANCOCIN) IVPB 1000 mg/200 mL premix        1,000 mg 200 mL/hr over 60 Minutes Intravenous Every 12 hours 12/16/11 0945 12/16/11 1240   12/16/11 0645   vancomycin (VANCOCIN) IVPB 1000 mg/200 mL premix        1,000 mg 200 mL/hr over 60 Minutes Intravenous  Once 12/15/11 2214 12/16/11 0759        . Postoperative day #1 hemoglobin was noted to be decreased. Additional blood draws were performed and showed gradual decreased hemoglobin to 7.3 postoperative day 2. The patient showed orthostatic blood pressure changes and dizziness occurring with changes in positioning during physical therapy requiring blood transfusion of 2 units for acute blood loss anemia. Additionally her sodium decreased to 127 but improved  following discontinuing IV fluids. This finding was consistent with hyponatremia due to hypervolemia. She was given sequential compression devices, early ambulation, and chemoprophylaxis for DVT prophylaxis. Due to to various joint or arthritides and age this patient demonstrated slow recuperation so that nursing home placement was undertaken. Skilled nursing facility was obtained and the patient was transferred at the end of her hospital stay. Her weightbearing status 50% weightbearing on right lower extremity using a walker. She may 100% weightbearing and transfers. She was started on Coumadin for anti-DVT prophylaxis. Plavix was discontinued during this time and she'll remain on aspirin with Coumadin. Patient is agreeable to transfer to [facility] for recuperation following ORIF of right intertrochanteric Hip Fracture. At the time of discharge the patient's INR noted to be therapeutic but likely To rise over the next several days so the dose has been  Adjusted to 1 mg per day and will need continued monitoring By the pharmacy or MD at [facility].  They benefited maximally from their hospital stay and there were no complications.    Recent vital signs:  Filed Vitals:   12/19/11 0915  BP: 118/73  Pulse:   Temp:   Resp:     Recent laboratory studies:  Results for orders placed during the hospital encounter of 12/15/11  CBC      Component Value Range   WBC 11.5 (*) 4.0 - 10.5 (K/uL)   RBC 4.21  3.87 - 5.11 (MIL/uL)   Hemoglobin 12.3  12.0 - 15.0 (g/dL)   HCT 13.0 (*) 86.5 - 46.0 (%)  MCV 84.3  78.0 - 100.0 (fL)   MCH 29.2  26.0 - 34.0 (pg)   MCHC 34.6  30.0 - 36.0 (g/dL)   RDW 16.1  09.6 - 04.5 (%)   Platelets 267  150 - 400 (K/uL)  BASIC METABOLIC PANEL      Component Value Range   Sodium 132 (*) 135 - 145 (mEq/L)   Potassium 3.8  3.5 - 5.1 (mEq/L)   Chloride 95 (*) 96 - 112 (mEq/L)   CO2 26  19 - 32 (mEq/L)   Glucose, Bld 130 (*) 70 - 99 (mg/dL)   BUN 15  6 - 23 (mg/dL)    Creatinine, Ser 4.09  0.50 - 1.10 (mg/dL)   Calcium 9.7  8.4 - 81.1 (mg/dL)   GFR calc non Af Amer 51 (*) >90 (mL/min)   GFR calc Af Amer 59 (*) >90 (mL/min)  TYPE AND SCREEN      Component Value Range   ABO/RH(D) O POS     Antibody Screen NEG     Sample Expiration 12/18/2011     Unit Number [id number]     Blood Component Type RED CELLS,LR     Unit division 00     Status of Unit ISSUED,FINAL     Transfusion Status OK TO TRANSFUSE     Crossmatch Result Compatible     Unit Number [id number]     Blood Component Type RED CELLS,LR     Unit division 00     Status of Unit ISSUED,FINAL     Transfusion Status OK TO TRANSFUSE     Crossmatch Result Compatible    ABO/RH      Component Value Range   ABO/RH(D) O POS    CBC      Component Value Range   WBC 12.8 (*) 4.0 - 10.5 (K/uL)   RBC 3.65 (*) 3.87 - 5.11 (MIL/uL)   Hemoglobin 10.6 (*) 12.0 - 15.0 (g/dL)   HCT 46.9 (*) 62.9 - 46.0 (%)   MCV 84.4  78.0 - 100.0 (fL)   MCH 29.0  26.0 - 34.0 (pg)   MCHC 34.4  30.0 - 36.0 (g/dL)   RDW 52.8  41.3 - 24.4 (%)   Platelets 261  150 - 400 (K/uL)  URINALYSIS, ROUTINE W REFLEX MICROSCOPIC      Component Value Range   Color, Urine YELLOW  YELLOW    APPearance CLOUDY (*) CLEAR    Specific Gravity, Urine 1.017  1.005 - 1.030    pH 6.0  5.0 - 8.0    Glucose, UA NEGATIVE  NEGATIVE (mg/dL)   Hgb urine dipstick LARGE (*) NEGATIVE    Bilirubin Urine NEGATIVE  NEGATIVE    Ketones, ur NEGATIVE  NEGATIVE (mg/dL)   Protein, ur NEGATIVE  NEGATIVE (mg/dL)   Urobilinogen, UA 0.2  0.0 - 1.0 (mg/dL)   Nitrite NEGATIVE  NEGATIVE    Leukocytes, UA TRACE (*) NEGATIVE   SURGICAL PCR SCREEN      Component Value Range   MRSA, PCR NEGATIVE  NEGATIVE    Staphylococcus aureus NEGATIVE  NEGATIVE   URINE MICROSCOPIC-ADD ON      Component Value Range   WBC, UA 3-6  <3 (WBC/hpf)   RBC / HPF 21-50  <3 (RBC/hpf)   Bacteria, UA FEW (*) RARE   CBC      Component Value Range   WBC 12.4 (*) 4.0 - 10.5 (K/uL)   RBC  2.74 (*) 3.87 - 5.11 (MIL/uL)   Hemoglobin 8.2 (*)  12.0 - 15.0 (g/dL)   HCT 16.1 (*) 09.6 - 46.0 (%)   MCV 84.3  78.0 - 100.0 (fL)   MCH 29.9  26.0 - 34.0 (pg)   MCHC 35.5  30.0 - 36.0 (g/dL)   RDW 04.5  40.9 - 81.1 (%)   Platelets 222  150 - 400 (K/uL)  PROTIME-INR      Component Value Range   Prothrombin Time 15.2  11.6 - 15.2 (seconds)   INR 1.18  0.00 - 1.49   PROTIME-INR      Component Value Range   Prothrombin Time 16.5 (*) 11.6 - 15.2 (seconds)   INR 1.31  0.00 - 1.49   CBC      Component Value Range   WBC 8.2  4.0 - 10.5 (K/uL)   RBC 2.53 (*) 3.87 - 5.11 (MIL/uL)   Hemoglobin 7.3 (*) 12.0 - 15.0 (g/dL)   HCT 91.4 (*) 78.2 - 46.0 (%)   MCV 85.0  78.0 - 100.0 (fL)   MCH 28.9  26.0 - 34.0 (pg)   MCHC 34.0  30.0 - 36.0 (g/dL)   RDW 95.6  21.3 - 08.6 (%)   Platelets 223  150 - 400 (K/uL)  BASIC METABOLIC PANEL      Component Value Range   Sodium 126 (*) 135 - 145 (mEq/L)   Potassium 4.3  3.5 - 5.1 (mEq/L)   Chloride 94 (*) 96 - 112 (mEq/L)   CO2 26  19 - 32 (mEq/L)   Glucose, Bld 119 (*) 70 - 99 (mg/dL)   BUN 11  6 - 23 (mg/dL)   Creatinine, Ser 5.78  0.50 - 1.10 (mg/dL)   Calcium 8.4  8.4 - 46.9 (mg/dL)   GFR calc non Af Amer 56 (*) >90 (mL/min)   GFR calc Af Amer 65 (*) >90 (mL/min)  PREPARE RBC (CROSSMATCH)      Component Value Range   Order Confirmation ORDER PROCESSED BY BLOOD BANK    PROTIME-INR      Component Value Range   Prothrombin Time 19.1 (*) 11.6 - 15.2 (seconds)   INR 1.57 (*) 0.00 - 1.49   CBC      Component Value Range   WBC 9.4  4.0 - 10.5 (K/uL)   RBC 3.22 (*) 3.87 - 5.11 (MIL/uL)   Hemoglobin 9.4 (*) 12.0 - 15.0 (g/dL)   HCT 62.9 (*) 52.8 - 46.0 (%)   MCV 83.5  78.0 - 100.0 (fL)   MCH 29.2  26.0 - 34.0 (pg)   MCHC 34.9  30.0 - 36.0 (g/dL)   RDW 41.3  24.4 - 01.0 (%)   Platelets 187  150 - 400 (K/uL)  BASIC METABOLIC PANEL      Component Value Range   Sodium 129 (*) 135 - 145 (mEq/L)   Potassium 4.0  3.5 - 5.1 (mEq/L)   Chloride 95  (*) 96 - 112 (mEq/L)   CO2 24  19 - 32 (mEq/L)   Glucose, Bld 119 (*) 70 - 99 (mg/dL)   BUN 14  6 - 23 (mg/dL)   Creatinine, Ser 2.72  0.50 - 1.10 (mg/dL)   Calcium 8.8  8.4 - 53.6 (mg/dL)   GFR calc non Af Amer 59 (*) >90 (mL/min)   GFR calc Af Amer 69 (*) >90 (mL/min)  PROTIME-INR      Component Value Range   Prothrombin Time 24.9 (*) 11.6 - 15.2 (seconds)   INR 2.21 (*) 0.00 - 1.49     Discharge Medications:  Medication List  As of 12/19/2011 11:07 AM   TAKE these medications         aspirin 81 MG tablet   Take 81 mg by mouth daily.      atorvastatin 20 MG tablet   Commonly known as: LIPITOR   TAKE 1 TABLET DAILY      clopidogrel 75 MG tablet   Commonly known as: PLAVIX   TAKE 1 TABLET DAILY      gabapentin 100 MG capsule   Commonly known as: NEURONTIN   2 tab by mouth bid      gabapentin 100 MG capsule   Commonly known as: NEURONTIN   Take 1 capsule (100 mg total) by mouth 2 (two) times daily.      hydrochlorothiazide 25 MG tablet   Commonly known as: HYDRODIURIL   TAKE 1 TABLET DAILY      HYDROcodone-acetaminophen 5-325 MG per tablet   Commonly known as: NORCO   Take 1-2 tablets by mouth every 4 (four) hours as needed.      lisinopril 20 MG tablet   Commonly known as: PRINIVIL,ZESTRIL   TAKE 1 TABLET DAILY      metoprolol succinate 50 MG 24 hr tablet   Commonly known as: TOPROL-XL   TAKE 1 TABLET DAILY      Warfarin - Pharmacist Dosing Inpatient Misc   1 each by Does not apply route daily at 6 PM.      warfarin 1 MG tablet   Commonly known as: COUMADIN   Take 1 tablet (1 mg total) by mouth one time only at 6 PM.            Diagnostic Studies: Dg Chest 2 View  12/15/2011  *RADIOLOGY REPORT*  Clinical Data: Preoperative chest radiograph.  Hypertension. History of myocardial infarction.  Coronary artery disease.  CHEST - 2 VIEW  Comparison: None.  Findings: Elevation of the right hemidiaphragm with subsegmental atelectasis or scarring over the  hemidiaphragm.  No airspace disease.  On the lateral view, there is fullness of the pulmonary arteries, likely secondary to the elevated right hemidiaphragm. Borderline cardiopericardial silhouette enlargement based on projection.  Small areas of subsegmental atelectasis at the left lung base.  IMPRESSION:  1.  No acute cardiopulmonary disease. 2.  Borderline cardiomegaly. 3.  Elevation of the right hemidiaphragm of unknown chronicity. Associated right basilar atelectasis.  Original Report Authenticated By: Andreas Newport, M.D.   Dg Hip Complete Right  12/15/2011  *RADIOLOGY REPORT*  Clinical Data: Right hip pain post fall  RIGHT HIP - COMPLETE 2+ VIEW  Comparison: 07/28/2011  Findings: Three views of the right hip submitted.  There is diffuse osteopenia.  There is mild impacted intertrochanteric fracture of proximal right femur.  IMPRESSION: Diffuse osteopenia.  Mild impacted intertrochanteric fracture of the proximal right femur.  Per CMS PQRS reporting requirements (PQRS Measure 24): Given the patient's age of greater than 50 and the fracture site (hip, distal radius, or spine), the patient should be tested for osteoporosis using DXA, and the appropriate treatment considered based on the DXA results.  Original Report Authenticated By: Natasha Mead, M.D.   Dg Hip Operative Right  12/16/2011  *RADIOLOGY REPORT*  Clinical Data: Right hip fracture.  DG OPERATIVE RIGHT HIP  Comparison: 12/15/2011  Findings: Portable intraoperative radiographs of the right hip show placement of a dynamic hip screw and side plate device.  There has been interval reduction of the intertrochanteric fracture of the right hip.  On the lateral image the  side plate and screws appear slightly medial from the midline of the shaft of the proximal right femur.  IMPRESSION:  1.  Status post ORIF of intertrochanteric fracture of the right hip.  Original Report Authenticated By: Rosealee Albee, M.D.    Disposition: Final discharge  disposition not confirmed  Discharge Orders    Future Appointments: Provider: Department: Dept Phone: Center:   04/25/2012 10:00 AM Kristian Covey, MD [facility] [phone] [facility]     Future Orders Please Complete By Expires   Diet - low sodium heart healthy      Call MD / Call 911      Comments:   If you experience chest pain or shortness of breath, CALL 911 and be transported to the hospital emergency room.  If you develope a fever above 101 F, pus (white drainage) or increased drainage or redness at the wound, or calf pain, call your surgeon's office.   Constipation Prevention      Comments:   Drink plenty of fluids.  Prune juice may be helpful.  You may use a stool softener, such as Colace (over the counter) 100 mg twice a day.  Use MiraLax (over the counter) for constipation as needed.   Increase activity slowly as tolerated      Weight Bearing as taught in Physical Therapy      Comments:   Use a walker or crutches as instructed.   Discharge instructions      Comments:   Keep dressing dry. May use tub chair to shower  Call if there is odor or saturation of dressing or worsening pain not controlled with medications. Call if fever greater than 101.5. Use crutches or walker 50% weight bearing on the right leg. Please follow up with an appointment with Dr. [name]  2 weeks from the time of surgery [phone]. Elevate as often as possible during the first week after surgery gradually increasing the time the leg is dependent or down there after. If swelling recurrs then elevate again. Wheel chair for longer distances.Apply ice to the surgery site as needed to relieve pain. Take aspirin 325 mg every day with food or snack.   Driving restrictions      Comments:   No driving.   Lifting restrictions      Comments:   No lifting for 6 weeks   Do not sit on low chairs, stoools or toilet seats, as it may be difficult to get up from low surfaces      Discharge wound care:       Comments:   If you have a hip bandage, keep it clean and dry.  Change your bandage as instructed by your health care providers.  If your bandage has been discontinued, keep your incision clean and dry.  Pat dry after bathing.  DO NOT put lotion or powder on your incision.      Follow-up Information    Follow up with Camilia Caywood E, MD in 2 weeks.   Contact information:   [employer] [address] [place] [address] [phone]           SignedKerrin Champagne 12/19/2011, 11:07 AM     Physician Discharge Summary  Patient ID: [name] [name] MRN: [id number] DOB/AGE: [birth year] 78 y.o.  Admit date: 12/15/2011 Discharge date:   Admission Diagnoses:  Principal Problem:  *Hip fracture, intertrochanteric, right, closed, initial encounter Active Problems:  Postoperative anemia due to acute blood loss  Hyponatremia with excess extracellular fluid volume  Discharge Diagnoses:  Same  Past Medical History  Diagnosis Date  . CAD (coronary artery disease)   . HTN (hypertension)   . Hyperlipidemia   . MI (myocardial infarction) 2002    Surgeries: Procedure(s): COMPRESSION HIP on 12/15/2011 - 12/16/2011   Consultants:    Discharged Condition: Improved  Hospital Course: Merica Prell is an 78 y.o. female who was admitted 12/15/2011 with a chief complaint of  Chief Complaint  Patient presents with  . Fall  , and found to have a diagnosis of Hip fracture, intertrochanteric, right, closed, initial encounter.  They were brought to the operating room on 12/15/2011 - 12/16/2011 and underwent the above named procedures.    They were given perioperative antibiotics:  Anti-infectives     Start     Dose/Rate Route Frequency Ordered Stop   12/16/11 1000   vancomycin (VANCOCIN) IVPB 1000 mg/200 mL premix        1,000 mg 200 mL/hr over 60 Minutes Intravenous Every 12 hours 12/16/11 0945 12/16/11 1240   12/16/11 0645   vancomycin (VANCOCIN) IVPB  1000 mg/200 mL premix        1,000 mg 200 mL/hr over 60 Minutes Intravenous  Once 12/15/11 2214 12/16/11 0759        .  They were given sequential compression devices, early ambulation, and chemoprophylaxis for DVT prophylaxis.  They benefited maximally from their hospital stay and there were no complications.    Recent vital signs:  Filed Vitals:   12/19/11 0915  BP: 118/73  Pulse:   Temp:   Resp:     Recent laboratory studies:  Results for orders placed during the hospital encounter of 12/15/11  CBC      Component Value Range   WBC 11.5 (*) 4.0 - 10.5 (K/uL)   RBC 4.21  3.87 - 5.11 (MIL/uL)   Hemoglobin 12.3  12.0 - 15.0 (g/dL)   HCT 16.1 (*) 09.6 - 46.0 (%)   MCV 84.3  78.0 - 100.0 (fL)   MCH 29.2  26.0 - 34.0 (pg)   MCHC 34.6  30.0 - 36.0 (g/dL)   RDW 04.5  40.9 - 81.1 (%)   Platelets 267  150 - 400 (K/uL)  BASIC METABOLIC PANEL      Component Value Range   Sodium 132 (*) 135 - 145 (mEq/L)   Potassium 3.8  3.5 - 5.1 (mEq/L)   Chloride 95 (*) 96 - 112 (mEq/L)   CO2 26  19 - 32 (mEq/L)   Glucose, Bld 130 (*) 70 - 99 (mg/dL)   BUN 15  6 - 23 (mg/dL)   Creatinine, Ser 9.14  0.50 - 1.10 (mg/dL)   Calcium 9.7  8.4 - 78.2 (mg/dL)   GFR calc non Af Amer 51 (*) >90 (mL/min)   GFR calc Af Amer 59 (*) >90 (mL/min)  TYPE AND SCREEN      Component Value Range   ABO/RH(D) O POS     Antibody Screen NEG     Sample Expiration 12/18/2011     Unit Number [id number]     Blood Component Type RED CELLS,LR     Unit division 00     Status of Unit ISSUED,FINAL     Transfusion Status OK TO TRANSFUSE     Crossmatch Result Compatible     Unit Number [id number]     Blood Component Type RED CELLS,LR     Unit division 00     Status of Unit ISSUED,FINAL     Transfusion Status  OK TO TRANSFUSE     Crossmatch Result Compatible    ABO/RH      Component Value Range   ABO/RH(D) O POS    CBC      Component Value Range   WBC 12.8 (*) 4.0 - 10.5 (K/uL)   RBC 3.65 (*) 3.87 - 5.11  (MIL/uL)   Hemoglobin 10.6 (*) 12.0 - 15.0 (g/dL)   HCT 16.1 (*) 09.6 - 46.0 (%)   MCV 84.4  78.0 - 100.0 (fL)   MCH 29.0  26.0 - 34.0 (pg)   MCHC 34.4  30.0 - 36.0 (g/dL)   RDW 04.5  40.9 - 81.1 (%)   Platelets 261  150 - 400 (K/uL)  URINALYSIS, ROUTINE W REFLEX MICROSCOPIC      Component Value Range   Color, Urine YELLOW  YELLOW    APPearance CLOUDY (*) CLEAR    Specific Gravity, Urine 1.017  1.005 - 1.030    pH 6.0  5.0 - 8.0    Glucose, UA NEGATIVE  NEGATIVE (mg/dL)   Hgb urine dipstick LARGE (*) NEGATIVE    Bilirubin Urine NEGATIVE  NEGATIVE    Ketones, ur NEGATIVE  NEGATIVE (mg/dL)   Protein, ur NEGATIVE  NEGATIVE (mg/dL)   Urobilinogen, UA 0.2  0.0 - 1.0 (mg/dL)   Nitrite NEGATIVE  NEGATIVE    Leukocytes, UA TRACE (*) NEGATIVE   SURGICAL PCR SCREEN      Component Value Range   MRSA, PCR NEGATIVE  NEGATIVE    Staphylococcus aureus NEGATIVE  NEGATIVE   URINE MICROSCOPIC-ADD ON      Component Value Range   WBC, UA 3-6  <3 (WBC/hpf)   RBC / HPF 21-50  <3 (RBC/hpf)   Bacteria, UA FEW (*) RARE   CBC      Component Value Range   WBC 12.4 (*) 4.0 - 10.5 (K/uL)   RBC 2.74 (*) 3.87 - 5.11 (MIL/uL)   Hemoglobin 8.2 (*) 12.0 - 15.0 (g/dL)   HCT 91.4 (*) 78.2 - 46.0 (%)   MCV 84.3  78.0 - 100.0 (fL)   MCH 29.9  26.0 - 34.0 (pg)   MCHC 35.5  30.0 - 36.0 (g/dL)   RDW 95.6  21.3 - 08.6 (%)   Platelets 222  150 - 400 (K/uL)  PROTIME-INR      Component Value Range   Prothrombin Time 15.2  11.6 - 15.2 (seconds)   INR 1.18  0.00 - 1.49   PROTIME-INR      Component Value Range   Prothrombin Time 16.5 (*) 11.6 - 15.2 (seconds)   INR 1.31  0.00 - 1.49   CBC      Component Value Range   WBC 8.2  4.0 - 10.5 (K/uL)   RBC 2.53 (*) 3.87 - 5.11 (MIL/uL)   Hemoglobin 7.3 (*) 12.0 - 15.0 (g/dL)   HCT 57.8 (*) 46.9 - 46.0 (%)   MCV 85.0  78.0 - 100.0 (fL)   MCH 28.9  26.0 - 34.0 (pg)   MCHC 34.0  30.0 - 36.0 (g/dL)   RDW 62.9  52.8 - 41.3 (%)   Platelets 223  150 - 400 (K/uL)    BASIC METABOLIC PANEL      Component Value Range   Sodium 126 (*) 135 - 145 (mEq/L)   Potassium 4.3  3.5 - 5.1 (mEq/L)   Chloride 94 (*) 96 - 112 (mEq/L)   CO2 26  19 - 32 (mEq/L)   Glucose, Bld 119 (*) 70 - 99 (  mg/dL)   BUN 11  6 - 23 (mg/dL)   Creatinine, Ser 4.09  0.50 - 1.10 (mg/dL)   Calcium 8.4  8.4 - 81.1 (mg/dL)   GFR calc non Af Amer 56 (*) >90 (mL/min)   GFR calc Af Amer 65 (*) >90 (mL/min)  PREPARE RBC (CROSSMATCH)      Component Value Range   Order Confirmation ORDER PROCESSED BY BLOOD BANK    PROTIME-INR      Component Value Range   Prothrombin Time 19.1 (*) 11.6 - 15.2 (seconds)   INR 1.57 (*) 0.00 - 1.49   CBC      Component Value Range   WBC 9.4  4.0 - 10.5 (K/uL)   RBC 3.22 (*) 3.87 - 5.11 (MIL/uL)   Hemoglobin 9.4 (*) 12.0 - 15.0 (g/dL)   HCT 91.4 (*) 78.2 - 46.0 (%)   MCV 83.5  78.0 - 100.0 (fL)   MCH 29.2  26.0 - 34.0 (pg)   MCHC 34.9  30.0 - 36.0 (g/dL)   RDW 95.6  21.3 - 08.6 (%)   Platelets 187  150 - 400 (K/uL)  BASIC METABOLIC PANEL      Component Value Range   Sodium 129 (*) 135 - 145 (mEq/L)   Potassium 4.0  3.5 - 5.1 (mEq/L)   Chloride 95 (*) 96 - 112 (mEq/L)   CO2 24  19 - 32 (mEq/L)   Glucose, Bld 119 (*) 70 - 99 (mg/dL)   BUN 14  6 - 23 (mg/dL)   Creatinine, Ser 5.78  0.50 - 1.10 (mg/dL)   Calcium 8.8  8.4 - 46.9 (mg/dL)   GFR calc non Af Amer 59 (*) >90 (mL/min)   GFR calc Af Amer 69 (*) >90 (mL/min)  PROTIME-INR      Component Value Range   Prothrombin Time 24.9 (*) 11.6 - 15.2 (seconds)   INR 2.21 (*) 0.00 - 1.49     Discharge Medications:   Medication List  As of 12/19/2011 11:07 AM   TAKE these medications         aspirin 81 MG tablet   Take 81 mg by mouth daily.      atorvastatin 20 MG tablet   Commonly known as: LIPITOR   TAKE 1 TABLET DAILY      clopidogrel 75 MG tablet   Commonly known as: PLAVIX   TAKE 1 TABLET DAILY      gabapentin 100 MG capsule   Commonly known as: NEURONTIN   2 tab by mouth bid       gabapentin 100 MG capsule   Commonly known as: NEURONTIN   Take 1 capsule (100 mg total) by mouth 2 (two) times daily.      hydrochlorothiazide 25 MG tablet   Commonly known as: HYDRODIURIL   TAKE 1 TABLET DAILY      HYDROcodone-acetaminophen 5-325 MG per tablet   Commonly known as: NORCO   Take 1-2 tablets by mouth every 4 (four) hours as needed.      lisinopril 20 MG tablet   Commonly known as: PRINIVIL,ZESTRIL   TAKE 1 TABLET DAILY      metoprolol succinate 50 MG 24 hr tablet   Commonly known as: TOPROL-XL   TAKE 1 TABLET DAILY      Warfarin - Pharmacist Dosing Inpatient Misc   1 each by Does not apply route daily at 6 PM.      warfarin 1 MG tablet   Commonly known as: COUMADIN   Take  1 tablet (1 mg total) by mouth one time only at 6 PM.            Diagnostic Studies: Dg Chest 2 View  12/15/2011  *RADIOLOGY REPORT*  Clinical Data: Preoperative chest radiograph.  Hypertension. History of myocardial infarction.  Coronary artery disease.  CHEST - 2 VIEW  Comparison: None.  Findings: Elevation of the right hemidiaphragm with subsegmental atelectasis or scarring over the hemidiaphragm.  No airspace disease.  On the lateral view, there is fullness of the pulmonary arteries, likely secondary to the elevated right hemidiaphragm. Borderline cardiopericardial silhouette enlargement based on projection.  Small areas of subsegmental atelectasis at the left lung base.  IMPRESSION:  1.  No acute cardiopulmonary disease. 2.  Borderline cardiomegaly. 3.  Elevation of the right hemidiaphragm of unknown chronicity. Associated right basilar atelectasis.  Original Report Authenticated By: Andreas Newport, M.D.   Dg Hip Complete Right  12/15/2011  *RADIOLOGY REPORT*  Clinical Data: Right hip pain post fall  RIGHT HIP - COMPLETE 2+ VIEW  Comparison: 07/28/2011  Findings: Three views of the right hip submitted.  There is diffuse osteopenia.  There is mild impacted intertrochanteric fracture of  proximal right femur.  IMPRESSION: Diffuse osteopenia.  Mild impacted intertrochanteric fracture of the proximal right femur.  Per CMS PQRS reporting requirements (PQRS Measure 24): Given the patient's age of greater than 50 and the fracture site (hip, distal radius, or spine), the patient should be tested for osteoporosis using DXA, and the appropriate treatment considered based on the DXA results.  Original Report Authenticated By: Natasha Mead, M.D.   Dg Hip Operative Right  12/16/2011  *RADIOLOGY REPORT*  Clinical Data: Right hip fracture.  DG OPERATIVE RIGHT HIP  Comparison: 12/15/2011  Findings: Portable intraoperative radiographs of the right hip show placement of a dynamic hip screw and side plate device.  There has been interval reduction of the intertrochanteric fracture of the right hip.  On the lateral image the side plate and screws appear slightly medial from the midline of the shaft of the proximal right femur.  IMPRESSION:  1.  Status post ORIF of intertrochanteric fracture of the right hip.  Original Report Authenticated By: Rosealee Albee, M.D.    Disposition: Final discharge disposition not confirmed  Discharge Orders    Future Appointments: Provider: Department: Dept Phone: Center:   04/25/2012 10:00 AM Kristian Covey, MD [facility] [phone] [facility]     Future Orders Please Complete By Expires   Diet - low sodium heart healthy      Call MD / Call 911      Comments:   If you experience chest pain or shortness of breath, CALL 911 and be transported to the hospital emergency room.  If you develope a fever above 101 F, pus (white drainage) or increased drainage or redness at the wound, or calf pain, call your surgeon's office.   Constipation Prevention      Comments:   Drink plenty of fluids.  Prune juice may be helpful.  You may use a stool softener, such as Colace (over the counter) 100 mg twice a day.  Use MiraLax (over the counter) for constipation as needed.    Increase activity slowly as tolerated      Weight Bearing as taught in Physical Therapy      Comments:   Use a walker or crutches as instructed.   Discharge instructions      Comments:   Keep dressing dry. May use tub chair to  shower  Call if there is odor or saturation of dressing or worsening pain not controlled with medications. Call if fever greater than 101.5. Use crutches or walker 50% weight bearing on the right leg. Please follow up with an appointment with Dr. [name]  2 weeks from the time of surgery [phone]. Elevate as often as possible during the first week after surgery gradually increasing the time the leg is dependent or down there after. If swelling recurrs then elevate again. Wheel chair for longer distances.Apply ice to the surgery site as needed to relieve pain. Take aspirin 325 mg every day with food or snack.   Driving restrictions      Comments:   No driving.   Lifting restrictions      Comments:   No lifting for 6 weeks   Do not sit on low chairs, stoools or toilet seats, as it may be difficult to get up from low surfaces      Discharge wound care:      Comments:   If you have a hip bandage, keep it clean and dry.  Change your bandage as instructed by your health care providers.  If your bandage has been discontinued, keep your incision clean and dry.  Pat dry after bathing.  DO NOT put lotion or powder on your incision.      Follow-up Information    Follow up with Yasmin Dibello E, MD in 2 weeks.   Contact information:   [employer] [address] [place] [address] [phone]           Signed: Kerrin Champagne 12/19/2011, 11:07 AM

## 2011-12-18 NOTE — Care Management Note (Signed)
    Page 1 of 2   12/18/2011     5:06:08 PM   CARE MANAGEMENT NOTE 12/18/2011  Patient:  [name] [name]   Account Number:  [id number]  Date Initiated:  12/18/2011  Documentation initiated by:  Colleen Can  Subjective/Objective Assessment:   dx RT HIP FRACTURE     Action/Plan:   SNF REHAB   Anticipated DC Date:  12/19/2011   Anticipated DC Plan:  SKILLED NURSING FACILITY  In-house referral  Clinical Social Worker      DC Planning Services  CM consult      [facility] Choice  NA   Choice offered to / List presented to:  NA   DME arranged  NA      DME agency  NA     HH arranged  NA      HH agency  NA   Status of service:  Completed, signed off Medicare Important Message given?  NA - LOS <3 / Initial given by admissions (If response is "NO", the following Medicare IM given date fields will be blank) Date Medicare IM given:   Date Additional Medicare IM given:    Discharge Disposition:    Per UR Regulation:    If discussed at Long Length of Stay Meetings, dates discussed:    Comments:

## 2011-12-18 NOTE — Progress Notes (Addendum)
ANTICOAGULATION CONSULT NOTE - Follow Up Consult  Pharmacy Consult for Warfarin Indication: VTE prophylaxis  Allergies  Allergen Reactions  . Penicillins     REACTION: swelling    Patient Measurements: Height: 5\' 3"  (160 cm) Weight: 154 lb (69.854 kg) IBW/kg (Calculated) : 52.4   Vital Signs: Temp: 98.8 F (37.1 C) (05/13 0451) Temp src: Oral (05/13 0451) BP: 141/90 mmHg (05/13 0451) Pulse Rate: 89  (05/13 0451)  Labs:  Basename 12/18/11 0421 12/17/11 0504 12/16/11 1753 12/16/11 1105 12/15/11 1550  HGB 9.4* 7.3* -- -- --  HCT 26.9* 21.5* 23.1* -- --  PLT 187 223 222 -- --  APTT -- -- -- -- --  LABPROT 19.1* 16.5* -- 15.2 --  INR 1.57* 1.31 -- 1.18 --  HEPARINUNFRC -- -- -- -- --  CREATININE 0.89 0.93 -- -- 1.01  CKTOTAL -- -- -- -- --  CKMB -- -- -- -- --  TROPONINI -- -- -- -- --    Estimated Creatinine Clearance: 46.5 ml/min (by C-G formula based on Cr of 0.89).   Assessment:  42 YOF s/p right hip fracture repair 5/11, now on coumadin for VTE prophylaxis.   Patient characteristics (female, weight, age) suggest starting at low warfarin dose.   Patient is also on Lovenox 30 mg SQ q12h until INR >/= 1.8.  INR responding  to 5 mg dose.  Will repeat 5 mg for another day.  No bleeding/complications reported.  Goal of Therapy:  INR 2-3   Plan:  Warfarin 5 mg po once today. Follow up INR in AM.   Darrol Angel, PharmD Pager: [phone] 12/18/2011,9:28 AM   ADDENDUM: Warfarin education completed with patient today. She expressed verbal understanding, said she's familiar with caoumadin. Advised pt that if she thought of any questions, to let the RN know and a Pharmacist can come back to talk with her.  Darrol Angel, PharmD Pager: [phone] 12/18/2011 10:39 AM

## 2011-12-18 NOTE — Progress Notes (Signed)
CSW following to assist with d/c planning. SNF bed is available at [facility] Tuesday if pt is stable for d/c. CSW will follow to assist with d/c planning to SNF.  Cori Razor  LCSW  [phone]

## 2011-12-18 NOTE — Evaluation (Signed)
Occupational Therapy Evaluation Patient Details Name: [name] [name] MRN: [id number] DOB: 03-Aug-1928 Today's Date: 12/18/2011 Time: 1324-4010 OT Time Calculation (min): 20 min  OT Assessment / Plan / Recommendation Clinical Impression  Pt presents to OT with dependence with ADL activity s/p fall in which she has a R hip fracture. Pt is PWB which is difficult for this pt. Pt will need extensive post acute OT to reach PLOF    OT Assessment  Patient needs continued OT Services    Follow Up Recommendations  Skilled nursing facility       Equipment Recommendations  Defer to next venue       Frequency  Min 2X/week    Precautions / Restrictions Restrictions Weight Bearing Restrictions: Yes RLE Weight Bearing: Partial weight bearing RLE Partial Weight Bearing Percentage or Pounds: 50%       ADL  Eating/Feeding: Performed;Set up Where Assessed - Eating/Feeding: Edge of bed Grooming: Simulated;Wash/dry face Where Assessed - Grooming: Unsupported sitting Upper Body Bathing: Simulated;Minimal assistance Where Assessed - Upper Body Bathing: Unsupported;Sitting, bed Lower Body Bathing: Simulated;+2 Total assistance;Comment for patient %;Other (comment) (20 percent with sit to stand) Where Assessed - Lower Body Bathing: Sit to stand from chair Upper Body Dressing: Simulated;Minimal assistance Where Assessed - Upper Body Dressing: Sitting, bed;Unsupported Lower Body Dressing: Simulated;+2 Total assistance;Comment for patient %;Other (comment) (20 percent with sit to stand) Where Assessed - Lower Body Dressing: Sit to stand from chair Toilet Transfer: +2 Total assistance;Performed;Comment for patient %;Other (comment) (20 percent with sit to stand) Toilet Transfer Method: Stand pivot Toilet Transfer Equipment: Bedside commode Toileting - Clothing Manipulation: Performed;+2 Total assistance Where Assessed - Glass blower/designer Manipulation: Standing Toileting - Hygiene: Performed;+2 Total  assistance Where Assessed - Toileting Hygiene: Standing Tub/Shower Transfer: Not assessed Tub/Shower Transfer Method: Not assessed Ambulation Related to ADLs: Pt had  a hard time maintaining WB status. Needeed verbal cues to stand up right and put weight through hands on walker    OT Diagnosis: Generalized weakness;Acute pain  OT Problem List: Decreased strength;Decreased activity tolerance;Pain OT Treatment Interventions: Self-care/ADL training;DME and/or AE instruction;Patient/family education;Therapeutic activities   OT Goals Acute Rehab OT Goals OT Goal Formulation: With patient Potential to Achieve Goals: Good ADL Goals Pt Will Transfer to Toilet: with mod assist;Stand pivot transfer;Maintaining weight bearing status ADL Goal: Toilet Transfer - Progress: Goal set today  Visit Information  Assistance Needed: +2       Prior Functioning  Home Living Lives With: Alone (neice helps out) Type of Home: House Home Access: Level entry Home Layout: One level Bathroom Shower/Tub: Health visitor: Handicapped height Home Adaptive Equipment: Straight cane;Walker - rolling;Bedside commode/3-in-1 Prior Function Level of Independence: Independent Able to Take Stairs?: Yes Driving: Yes Vocation: Retired Musician: HOH;No difficulties    Cognition  Overall Cognitive Status: Appears within functional limits for tasks assessed/performed Arousal/Alertness: Awake/alert Orientation Level: Appears intact for tasks assessed Behavior During Session: [facility] for tasks performed Cognition - Other Comments: Pt recalls her fall event and is motivated to get better.  Pt admits she does not tolerate pain well.    Extremity/Trunk Assessment Right Upper Extremity Assessment RUE ROM/Strength/Tone: WFL for tasks assessed Left Upper Extremity Assessment LUE ROM/Strength/Tone: WFL for tasks assessed   Mobility Bed Mobility Bed Mobility: Supine to Sit Supine to Sit: 1:  +2 Total assist Supine to Sit: Patient Percentage: 20% Sitting - Scoot to Edge of Bed: 1: +2 Total assist Sitting - Scoot to Edge of Bed: Patient Percentage: 10% Details  for Bed Mobility Assistance: increased time for pain control and 50% VC's on proper technique. Transfers Sit to Stand: 1: +2 Total assist;From elevated surface;From bed Sit to Stand: Patient Percentage: 20% Stand to Sit: 1: +2 Total assist;To chair/3-in-1 Stand to Sit: Patient Percentage: 20% Details for Transfer Assistance: .  Pt demon MAX anxiety with activity and requires increased time and VC's . Pt demonstartes great difficulty weight shifting and advancing either LE to attempt amb and trun correctlt to chair.           End of Session OT - End of Session Activity Tolerance: Patient limited by pain Patient left: in bed   Nayellie Sanseverino, Karin Golden D 12/18/2011, 1:23 PM

## 2011-12-18 NOTE — Progress Notes (Signed)
Physical Therapy Treatment Patient Details Name: [name] [name] MRN: [id number] DOB: 01-May-1928 Today's Date: 12/18/2011 Time: 4098-1191 PT Time Calculation (min): 25 min  PT Assessment / Plan / Recommendation Comments on Treatment Session  Pt recalls full event of her falling.  Instructed pt on PWB status and shared an X ray photo with her to educated on type sugery performed.  Assisted pt from supine to EOB + 2 assist and increased time 2nd pain and anxiety/fear of more pain.  No c/o dizzyness.  Attempted amb however pt unable to take functional steps, so pivoted to recliner and positioned to comfort.  Pt propgressing slowly and plans to D/C to SNF.    Follow Up Recommendations  Inpatient Rehab   (Pt would prefer not to go to a SNF)   Barriers to Discharge        Equipment Recommendations  Defer to next venue;Other (comment) (will need a RW once D/C to home)    Recommendations for Other Services    Frequency Min 4X/week   Plan Discharge plan remains appropriate    Precautions / Restrictions Restrictions Weight Bearing Restrictions: Yes RLE Weight Bearing: Partial weight bearing RLE Partial Weight Bearing Percentage or Pounds: 50%   Pertinent Vitals/Pain     Mobility  Bed Mobility Bed Mobility: Supine to Sit Supine to Sit: 1: +2 Total assist Supine to Sit: Patient Percentage: 20% Sitting - Scoot to Edge of Bed: 1: +2 Total assist Sitting - Scoot to Edge of Bed: Patient Percentage: 10% Details for Bed Mobility Assistance: increased time for pain control and 50% VC's on proper technique. Transfers Transfers: Sit to Stand;Stand to Sit Sit to Stand: 1: +2 Total assist;From elevated surface;From bed Sit to Stand: Patient Percentage: 20% Stand to Sit: 1: +2 Total assist;To chair/3-in-1 Stand to Sit: Patient Percentage: 20% Details for Transfer Assistance: No c/o dizzyness today, pt received 2 units of blood yesterday.  Pt demon MAX anxiety with activity and requires increased  time and VC's . Pt demonstartes great difficulty weight shifting and advancing either LE to attempt amb and trun correctlt to chair.   Ambulation/Gait Ambulation Distance (Feet): 0 Feet Ambulation/Gait Assistance Details: Attempted amb however pt unable to functionallt weight shift to advance either LE. Wheelchair Mobility Wheelchair Mobility: No    Exercises     PT Diagnosis:    PT Problem List:   PT Treatment Interventions:     PT Goals Acute Rehab PT Goals PT Goal Formulation: With patient Potential to Achieve Goals: Fair Pt will go Supine/Side to Sit: with mod assist PT Goal: Supine/Side to Sit - Progress: Progressing toward goal Pt will go Sit to Supine/Side: with min assist PT Goal: Sit to Supine/Side - Progress: Progressing toward goal Pt will go Sit to Stand: with supervision PT Goal: Sit to Stand - Progress: Progressing toward goal Pt will go Stand to Sit: with supervision PT Goal: Stand to Sit - Progress: Progressing toward goal Pt will Transfer Bed to Chair/Chair to Bed: with min assist PT Transfer Goal: Bed to Chair/Chair to Bed - Progress: Progressing toward goal Pt will Ambulate: 16 - 50 feet;with min assist;with mod assist;with least restrictive assistive device PT Goal: Ambulate - Progress: Progressing toward goal  Visit Information  Last PT Received On: 12/18/11 Assistance Needed: +2    Subjective Data  Subjective: I'm hurting really bad but I will try Patient Stated Goal: to get home   Cognition  Overall Cognitive Status: Appears within functional limits for tasks assessed/performed Arousal/Alertness: Awake/alert Orientation  Level: Appears intact for tasks assessed Behavior During Session: [facility] for tasks performed Cognition - Other Comments: Pt recalls her fall event and is motivated to get better.  Pt admits she does not tolerate pain well.    Balance     End of Session PT - End of Session Equipment Utilized During Treatment: Gait belt Activity  Tolerance: Patient limited by pain;Other (comment) (fear/anxiety) Patient left: in chair;with call bell/phone within reach Nurse Communication: Other (comment) (+ 2 assist)   *Will discuss D/C to CIR vs SNF to LPT as initially planned on eval as pt would prefer NOT go to a SNF unless she absolutely has to.    Felecia Shelling  PTA [facility]  Acute  Rehab Pager     [phone]  Agree with LPT  Above note Blanchard Kelch PT

## 2011-12-19 LAB — PROTIME-INR: INR: 2.21 — ABNORMAL HIGH (ref 0.00–1.49)

## 2011-12-19 MED ORDER — WARFARIN SODIUM 1 MG PO TABS
1.0000 mg | ORAL_TABLET | Freq: Once | ORAL | Status: DC
  Filled 2011-12-19: qty 1

## 2011-12-19 MED ORDER — GABAPENTIN 100 MG PO CAPS: 100.0000 mg | ORAL_CAPSULE | Freq: Two times a day (BID) | ORAL | Status: DC

## 2011-12-19 MED ORDER — WARFARIN - PHARMACIST DOSING INPATIENT: 5.0000 | Freq: Every day | Status: DC

## 2011-12-19 MED ORDER — WARFARIN SODIUM 1 MG PO TABS: 1.0000 mg | ORAL_TABLET | Freq: Once | ORAL | Status: DC

## 2011-12-19 MED ORDER — HYDROCODONE-ACETAMINOPHEN 5-325 MG PO TABS: 1.0000 | ORAL_TABLET | ORAL | Status: AC | PRN

## 2011-12-19 MED ORDER — WARFARIN - PHARMACIST DOSING INPATIENT: 1.0000 | Freq: Every day | Status: DC

## 2011-12-19 MED ORDER — WARFARIN SODIUM 5 MG PO TABS: 5.0000 mg | ORAL_TABLET | Freq: Once | ORAL | Status: DC

## 2011-12-19 MED ORDER — HYDROCODONE-ACETAMINOPHEN 5-325 MG PO TABS: 1.0000 | ORAL_TABLET | ORAL | Status: DC | PRN

## 2011-12-19 NOTE — Progress Notes (Signed)
Subjective: 3 Days Post-Op Procedure(s) (LRB): COMPRESSION HIP (Right)Awake alert and Oriented x 4. Not excited about [facility] for rehab but understands that it Is the best choice in her situation. I will allow full weight bearing in transfers.  Patient reports pain as mild.    Objective:   VITALS:  Temp:  [97.6 F (36.4 C)-98 F (36.7 C)] 97.6 F (36.4 C) (05/14 0446) Pulse Rate:  [66-86] 72  (05/14 0446) Resp:  [16-18] 18  (05/14 0446) BP: (102-136)/(71-81) 136/81 mmHg (05/14 0446) SpO2:  [93 %] 93 % (05/14 0446)  Neurologically intact ABD soft Neurovascular intact Sensation intact distally Intact pulses distally Dorsiflexion/Plantar flexion intact Incision: dressing C/D/I No cellulitis present   LABS  Basename 12/18/11 0421 12/17/11 0504 12/16/11 1753  HGB 9.4* 7.3* 8.2*  WBC 9.4 8.2 --  PLT 187 223 --    Basename 12/18/11 0421 12/17/11 0504  NA 129* 126*  K 4.0 4.3  CL 95* 94*  CO2 24 26  BUN 14 11  CREATININE 0.89 0.93  GLUCOSE 119* 119*    Basename 12/19/11 0427 12/18/11 0421  LABPT -- --  INR 2.21* 1.57*     Assessment/Plan: 3 Days Post-Op Procedure(s) (LRB): COMPRESSION HIP (Right)  Advance diet Up with therapy D/C IV fluids Discharge to SNF  [name] [name] E 12/19/2011, 8:36 AM

## 2011-12-19 NOTE — Progress Notes (Addendum)
ANTICOAGULATION CONSULT NOTE - Follow Up Consult  Pharmacy Consult for Warfarin Indication: VTE prophylaxis  Allergies  Allergen Reactions  . Penicillins     REACTION: swelling   Patient Measurements: Height: 5\' 3"  (160 cm) Weight: 154 lb (69.854 kg) IBW/kg (Calculated) : 52.4   Vital Signs: Temp: 97.6 F (36.4 C) (05/14 0446) Temp src: Oral (05/14 0446) BP: 118/73 mmHg (05/14 0915) Pulse Rate: 81  (05/14 0911)  Labs:  Basename 12/19/11 0427 12/18/11 0421 12/17/11 0504 12/16/11 1753  HGB -- 9.4* 7.3* --  HCT -- 26.9* 21.5* 23.1*  PLT -- 187 223 222  APTT -- -- -- --  LABPROT 24.9* 19.1* 16.5* --  INR 2.21* 1.57* 1.31 --  HEPARINUNFRC -- -- -- --  CREATININE -- 0.89 0.93 --  CKTOTAL -- -- -- --  CKMB -- -- -- --  TROPONINI -- -- -- --   Estimated Creatinine Clearance: 46.5 ml/min (by C-G formula based on Cr of 0.89).  Assessment:  [name] [name] s/p right hip fracture repair 5/11, now on coumadin for VTE prophylaxis.   Patient characteristics (female, weight, age) suggest starting at low warfarin dose.  Patient is also on Lovenox 30 mg SQ q12h until INR >/= 1.8.  INR is now 2.21 s/p 3 doses of 5 mg.    Hgb improved s/p 2 units PRBCs on 5/12  No bleeding/complications reported.  Plan discharge to SNF   Goal of Therapy:  INR 2-3   Plan:   MD wrote for Coumadin 5 mg po daily to be continued at [facility].  Will contact MD to give 1 mg po x 1 tonight then PT/INR tomorrow.  Pharmacy can provide dosing for Coumadin 1mg  tonight. Discontinue Lovenox  Geoffry Paradise, PharmD.   Pager:  [phone] 10:24 AM    Addendum:   Discussed with Dr. [name].  MD will change discharge coumadin script to read 1mg  po daily with SNF pharmacy managing Coumadin upon transfer.  Recommend PT/INR tomorrow (5/15).    Geoffry Paradise, PharmD.   Pager:  [phone] 10:53 AM

## 2011-12-19 NOTE — Progress Notes (Signed)
Physical Therapy Treatment Patient Details Name: [name] [name] MRN: [id number] DOB: 1928-01-07 Today's Date: 12/19/2011 Time: 3086-5784 PT Time Calculation (min): 14 min  PT Assessment / Plan / Recommendation Comments on Treatment Session  Pt able to recall PBW.  Requires less assist when cued to [facility] through UEs.  Pt to d/c to SNF later today.    Follow Up Recommendations  Inpatient Rehab (pt with d/c plans for SNF however)    Barriers to Discharge        Equipment Recommendations  Defer to next venue    Recommendations for Other Services    Frequency     Plan Discharge plan remains appropriate;Frequency remains appropriate    Precautions / Restrictions Precautions Precautions: Fall Restrictions Weight Bearing Restrictions: Yes RLE Weight Bearing: Partial weight bearing RLE Partial Weight Bearing Percentage or Pounds: 50%   Pertinent Vitals/Pain     Mobility  Bed Mobility Bed Mobility: Supine to Sit Supine to Sit: 1: +2 Total assist;HOB elevated;With rails Supine to Sit: Patient Percentage: 70% Sitting - Scoot to Edge of Bed: 3: Mod assist Details for Bed Mobility Assistance: pt requires increased time however able to assist more with transfer today Transfers Transfers: Sit to Stand;Stand to Sit Sit to Stand: 1: +2 Total assist;From elevated surface;From bed Sit to Stand: Patient Percentage: 40% Stand to Sit: 1: +2 Total assist;To chair/3-in-1 Stand to Sit: Patient Percentage: 40% Stand Pivot Transfers: 1: +2 Total assist Stand Pivot Transfers: Patient Percentage: 60% Details for Transfer Assistance: pt required verbal cues for safe technique and multiple verbal cues to use [facility] to assist with [facility]    Exercises Total Joint Exercises Ankle Circles/Pumps: AROM;Both;10 reps;Supine   PT Diagnosis:    PT Problem List:   PT Treatment Interventions:     PT Goals Acute Rehab PT Goals PT Goal: Supine/Side to Sit - Progress: Progressing toward goal PT Goal: Sit to  Stand - Progress: Progressing toward goal PT Goal: Stand to Sit - Progress: Progressing toward goal PT Transfer Goal: Bed to Chair/Chair to Bed - Progress: Progressing toward goal  Visit Information  Last PT Received On: 12/19/11 Assistance Needed: +2    Subjective Data  Subjective: "I know I need to do this."  (mobility)   Cognition  Overall Cognitive Status: Appears within functional limits for tasks assessed/performed    Balance     End of Session PT - End of Session Equipment Utilized During Treatment: Gait belt Activity Tolerance: Patient limited by pain;Other (comment) (anxiety) Patient left: in chair;with call bell/phone within reach    [facility] E 12/19/2011, 12:03 PM Pager: [phone]

## 2011-12-20 NOTE — Progress Notes (Signed)
Clinical Social Work Department CLINICAL SOCIAL WORK PLACEMENT NOTE 12/20/2011  Patient:  Hector,Aara  Account Number:  [id number] Admit date:  12/15/2011  Clinical Social Worker:  Leron Croak, CLINICAL SOCIAL WORKER  Date/time:  12/17/2011 04:42 PM  Clinical Social Work is seeking post-discharge placement for this patient at the following level of care:   SKILLED NURSING   (*CSW will update this form in Epic as items are completed)   12/17/2011  Patient/family provided with [facility] Department of Clinical Social Work's list of facilities offering this level of care within the geographic area requested by the patient (or if unable, by the patient's family).  12/17/2011  Patient/family informed of their freedom to choose among providers that offer the needed level of care, that participate in Medicare, Medicaid or managed care program needed by the patient, have an available bed and are willing to accept the patient.  12/17/2011  Patient/family informed of [facility]' ownership interest in [facility], as well as of the fact that they are under no obligation to receive care at this facility.  PASARR submitted to EDS on 12/17/2011 PASARR number received from EDS on   FL2 transmitted to all facilities in geographic area requested by pt/family on  12/18/2011 FL2 transmitted to all facilities within larger geographic area on   Patient informed that his/her managed care company has contracts with or will negotiate with  certain facilities, including the following:     Patient/family informed of bed offers received:  12/18/2011 Patient chooses bed at [facility] Physician recommends and patient chooses bed at    Patient to be transferred to [facility] on  12/19/2011 Patient to be transferred to facility by [facility]  The following physician request were entered in Epic:   Additional Comments: Cori Razor  LCSW  [phone]

## 2011-12-21 ENCOUNTER — Encounter (HOSPITAL_COMMUNITY): Payer: Self-pay | Admitting: Specialist

## 2011-12-25 LAB — PROTIME-INR

## 2012-01-01 LAB — PROTIME-INR

## 2012-01-04 LAB — PROTIME-INR

## 2012-01-16 LAB — PROTIME-INR

## 2012-03-26 ENCOUNTER — Ambulatory Visit (INDEPENDENT_AMBULATORY_CARE_PROVIDER_SITE_OTHER): Payer: Medicare Other | Admitting: Family Medicine

## 2012-03-26 ENCOUNTER — Encounter: Payer: Self-pay | Admitting: Family Medicine

## 2012-03-26 VITALS — BP 140/90 | HR 58 | Temp 98.0°F | Wt 138.0 lb

## 2012-03-26 DIAGNOSIS — G4700 Insomnia, unspecified: Secondary | ICD-10-CM

## 2012-03-26 DIAGNOSIS — R634 Abnormal weight loss: Secondary | ICD-10-CM

## 2012-03-26 DIAGNOSIS — R197 Diarrhea, unspecified: Secondary | ICD-10-CM

## 2012-03-26 DIAGNOSIS — E785 Hyperlipidemia, unspecified: Secondary | ICD-10-CM

## 2012-03-26 DIAGNOSIS — D649 Anemia, unspecified: Secondary | ICD-10-CM

## 2012-03-26 DIAGNOSIS — S40029A Contusion of unspecified upper arm, initial encounter: Secondary | ICD-10-CM

## 2012-03-26 DIAGNOSIS — S40021A Contusion of right upper arm, initial encounter: Secondary | ICD-10-CM

## 2012-03-26 LAB — CBC WITH DIFFERENTIAL/PLATELET
Basophils Absolute: 0 10*3/uL (ref 0.0–0.1)
Eosinophils Relative: 0.4 % (ref 0.0–5.0)
HCT: 37.8 % (ref 36.0–46.0)
Hemoglobin: 12.2 g/dL (ref 12.0–15.0)
Lymphocytes Relative: 12.5 % (ref 12.0–46.0)
Lymphs Abs: 1.2 10*3/uL (ref 0.7–4.0)
Monocytes Relative: 7.4 % (ref 3.0–12.0)
Neutro Abs: 7.7 10*3/uL (ref 1.4–7.7)
Platelets: 330 10*3/uL (ref 150.0–400.0)
WBC: 9.7 10*3/uL (ref 4.5–10.5)

## 2012-03-26 LAB — BASIC METABOLIC PANEL
BUN: 10 mg/dL (ref 6–23)
Creatinine, Ser: 0.9 mg/dL (ref 0.4–1.2)
GFR: 62.87 mL/min (ref >60.00)
Glucose, Bld: 98 mg/dL (ref 70–99)

## 2012-03-26 LAB — HEPATIC FUNCTION PANEL
Albumin: 4.1 g/dL (ref 3.5–5.2)
Total Protein: 6.9 g/dL (ref 6.0–8.3)

## 2012-03-26 LAB — TSH: TSH: 0.58 u[IU]/mL (ref 0.35–5.50)

## 2012-03-26 LAB — SEDIMENTATION RATE: Sed Rate: 9 mm/hr (ref 0–22)

## 2012-03-26 LAB — LIPID PANEL: Total CHOL/HDL Ratio: 2

## 2012-03-26 MED ORDER — MIRTAZAPINE 15 MG PO TBDP: 15.0000 mg | ORAL_TABLET | Freq: Every day | ORAL | Status: DC

## 2012-03-26 NOTE — Patient Instructions (Signed)
Hold Tramadol for now.

## 2012-03-26 NOTE — Progress Notes (Signed)
Subjective:    Patient ID: [name] [name], female    DOB: 1928-04-08, 78 y.o.   MRN: [id number]  HPI  Patient is seen for several issues. She has history of CAD, hypertension, hyperlipidemia, history of gout. She had fall with right hip fracture in May and was discharged from rehabilitation July 9. Still has home physical therapy. Last Thursday she got up to the bathroom felt somewhat lightheaded and fell landing on right side. Bruising of right upper extremity and back. No other falls since then. No syncope. She takes Plavix. Was on Coumadin briefly following hip surgery but off at this time. Denies any recurrent orthostasis symptoms.  She's had some weight loss. Previous office visit 154 pounds down to 138 pounds. Poor appetite. Poor sleep at night. Some depressive symptoms. No suicidal ideation.  Patient had anemia following surgery hemoglobin 9.4. No check since then. Also his had 3 day history of nonbloody diarrhea without vomiting. Poor appetite but no nausea. No abdominal pain.  Past Medical History  Diagnosis Date  . CAD (coronary artery disease)   . HTN (hypertension)   . Hyperlipidemia   . MI (myocardial infarction) 2002   Past Surgical History  Procedure Date  . Dilation and curettage of uterus 1960  . Hystrectomy   . Cataract extraction 1998    RIGHT AND LEFT EYE  . Cholecystectomy 2000  . Refractive surgery 2002  . Ptca 2002  . Abdominal hysterectomy   . Compression hip screw 12/16/2011    Procedure: COMPRESSION HIP;  Surgeon: Kerrin Champagne, MD;  Location: [facility];  Service: Orthopedics;  Laterality: Right;    reports that she has never smoked. She has never used smokeless tobacco. She reports that she does not drink alcohol or use illicit drugs. family history includes Aneurysm (age of onset:62) in her brother; Cancer (age of onset:83) in her sister; Hypertension in her father; and Pneumonia (age of onset:86) in her mother. Allergies  Allergen Reactions  . Penicillins      REACTION: swelling      Review of Systems  Constitutional: Positive for appetite change, fatigue and unexpected weight change. Negative for fever and chills.  Respiratory: Negative for cough and shortness of breath.   Cardiovascular: Negative for chest pain.  Gastrointestinal: Negative for abdominal pain and blood in stool.  Genitourinary: Negative for dysuria.  Neurological: Positive for dizziness.       Objective:   Physical Exam  Constitutional: She is oriented to person, place, and time. She appears well-developed and well-nourished.  Neck: Neck supple. No thyromegaly present.  Cardiovascular: Normal rate and regular rhythm.   Pulmonary/Chest: Effort normal and breath sounds normal. No respiratory distress. She has no wheezes. She has no rales.  Musculoskeletal: She exhibits no edema.       Patient has extensive bruising right arm but full range of motion shoulder and elbow. No bony tenderness.  She has bruising lumbar and thoracic region of back -no spinal tenderness. Nontender  Neurological: She is alert and oriented to person, place, and time. No cranial nerve deficit.          Assessment & Plan:  #1 status post recent falls right hip fracture healing with patient undergoing continue physical therapy #2 recent falls right upper extremity ecchymosis and diffuse ecchymosis back -low suspicion for bony injury clinically  #3 recent weight loss with some associated poor appetite and difficulty sleeping. She's had some increased depressed mood. Start Remeron 15 mg SolTab at night. Reassess one month. Obtain  screening labs today CBC, TSH, basic metabolic panel, hepatic panel  #4 history of hyperlipidemia. Check lipid and hepatic panel

## 2012-03-27 NOTE — Progress Notes (Signed)
Quick Note:  Called pt to make aware of lab results. Pt states she has two hearing aids and will have her nephew call back to get results. ______

## 2012-03-28 NOTE — Progress Notes (Signed)
Quick Note:  Spoke with pt's nephew about lab work. ______

## 2012-04-25 ENCOUNTER — Ambulatory Visit (INDEPENDENT_AMBULATORY_CARE_PROVIDER_SITE_OTHER): Payer: Medicare Other | Admitting: Family Medicine

## 2012-04-25 ENCOUNTER — Telehealth: Payer: Self-pay | Admitting: *Deleted

## 2012-04-25 ENCOUNTER — Encounter: Payer: Self-pay | Admitting: Family Medicine

## 2012-04-25 VITALS — BP 150/80 | Temp 98.0°F | Wt 140.0 lb

## 2012-04-25 DIAGNOSIS — Z23 Encounter for immunization: Secondary | ICD-10-CM

## 2012-04-25 DIAGNOSIS — E871 Hypo-osmolality and hyponatremia: Secondary | ICD-10-CM

## 2012-04-25 DIAGNOSIS — E876 Hypokalemia: Secondary | ICD-10-CM

## 2012-04-25 DIAGNOSIS — M1710 Unilateral primary osteoarthritis, unspecified knee: Secondary | ICD-10-CM

## 2012-04-25 DIAGNOSIS — I10 Essential (primary) hypertension: Secondary | ICD-10-CM

## 2012-04-25 DIAGNOSIS — R634 Abnormal weight loss: Secondary | ICD-10-CM

## 2012-04-25 LAB — BASIC METABOLIC PANEL
CO2: 29 mEq/L (ref 19–32)
Chloride: 100 mEq/L (ref 96–112)
Potassium: 2.7 mEq/L — CL (ref 3.5–5.1)

## 2012-04-25 MED ORDER — POTASSIUM CHLORIDE CRYS ER 20 MEQ PO TBCR: EXTENDED_RELEASE_TABLET | ORAL | Status: DC

## 2012-04-25 NOTE — Patient Instructions (Signed)
Increase gabapentin to 2 at night. If no improvement in sleep after one week further titrate to gabapentin 3 at night

## 2012-04-25 NOTE — Telephone Encounter (Signed)
Pt informed, scheduling lab now, Rx sent in

## 2012-04-25 NOTE — Telephone Encounter (Signed)
Start K-dur 20 meq 2 tablets twice daily for 2 days, then one bid and pt needs repeat BMP by next Monday

## 2012-04-25 NOTE — Telephone Encounter (Signed)
Critical lab called from [facility], potassium 2.7

## 2012-04-25 NOTE — Progress Notes (Signed)
Subjective:    Patient ID: [name] [name], female    DOB: 1928/01/01, 78 y.o.   MRN: [id number]  HPI  Here for followup multiple items. Refer to prior note. Recent hip fracture. Undergoing physical therapy. Had some weight loss 154 pounds to 138 pounds. Poor appetite. Started Remeron last visit. Appetite has improved she has gained a couple pounds. Mood is stable. Still not sleeping well. Reported restless leg symptoms. She's taking gabapentin but only 100 mg twice daily which has helped neuropathic pain but has not helped restless leg symptoms. She thinks this is why she's not sleeping. Minimal caffeine use.  Other problems include history of CAD, hypertension, hyperlipidemia, gout. Recently had some diarrhea issues which have been resolved for the past 5 or 6 days. Recent labs revealed low sodium 129 and low potassium 3.3. We reduced her HCTZ to 25 mg one half tablet daily. Eating has improved. No palpitations. No chest pains.  Progressive right knee stiffness and pain. Limiting physical therapy. Known osteoarthritis. Requesting steroid injection. This has never been injected previously.  Past Medical History  Diagnosis Date  . CAD (coronary artery disease)   . HTN (hypertension)   . Hyperlipidemia   . MI (myocardial infarction) 2002   Past Surgical History  Procedure Date  . Dilation and curettage of uterus 1960  . Hystrectomy   . Cataract extraction 1998    RIGHT AND LEFT EYE  . Cholecystectomy 2000  . Refractive surgery 2002  . Ptca 2002  . Abdominal hysterectomy   . Compression hip screw 12/16/2011    Procedure: COMPRESSION HIP;  Surgeon: Kerrin Champagne, MD;  Location: [facility];  Service: Orthopedics;  Laterality: Right;    reports that she has never smoked. She has never used smokeless tobacco. She reports that she does not drink alcohol or use illicit drugs. family history includes Aneurysm (age of onset:62) in her brother; Cancer (age of onset:83) in her sister; Hypertension in  her father; and Pneumonia (age of onset:86) in her mother. Allergies  Allergen Reactions  . Penicillins     REACTION: swelling      Review of Systems  Constitutional: Negative for appetite change, fatigue and unexpected weight change.  Eyes: Negative for visual disturbance.  Respiratory: Negative for cough, chest tightness, shortness of breath and wheezing.   Cardiovascular: Negative for chest pain, palpitations and leg swelling.  Genitourinary: Negative for dysuria.  Musculoskeletal: Positive for arthralgias.  Neurological: Negative for dizziness, seizures, syncope, weakness, light-headedness and headaches.  Psychiatric/Behavioral: Positive for disturbed wake/sleep cycle. Negative for dysphoric mood.       Objective:   Physical Exam  Constitutional: She is oriented to person, place, and time. She appears well-developed and well-nourished.  Neck: Neck supple.  Cardiovascular: Normal rate and regular rhythm.   Pulmonary/Chest: Effort normal and breath sounds normal. No respiratory distress. She has no wheezes. She has no rales.  Musculoskeletal: She exhibits no edema.       Right knee reveals crepitus with flexion and extension. No effusion. No warmth. No erythema.  Neurological: She is alert and oriented to person, place, and time.  Psychiatric: She has a normal mood and affect.          Assessment & Plan:  #1 osteoarthritis right knee. This is limiting her ability to accomplish necessary rehabilitation for her hip and is also starting to affect ambulation more. Discussed risk and benefits of corticosteroid injection patient consents. Skin prepped with Betadine. Using sterile technique 25-gauge one and1/2 inch needle  injected 40 mg Depo-Medrol and 2 cc of plain Xylocaine. This was done from inferior- medial approach in relation to patella.  Patient tolerated well. #2 recent hypokalemia and hyponatremia. Recent reduction HCTZ. Recheck basic metabolic panel  #3 probable restless  leg syndrome. Increase gabapentin to 2 at night.  Reassess one month. Not responsive to this consider other medications such as Mirapex #4 recent weight loss stabilized and improved with Remeron.  #5 health maintenance. Flu vaccine recommended and no contraindications.

## 2012-04-29 ENCOUNTER — Other Ambulatory Visit (INDEPENDENT_AMBULATORY_CARE_PROVIDER_SITE_OTHER): Payer: Medicare Other

## 2012-04-29 DIAGNOSIS — E876 Hypokalemia: Secondary | ICD-10-CM

## 2012-04-30 LAB — BASIC METABOLIC PANEL
CO2: 26 mEq/L (ref 19–32)
Chloride: 103 mEq/L (ref 96–112)
Glucose, Bld: 86 mg/dL (ref 70–99)
Potassium: 4.3 mEq/L (ref 3.5–5.1)
Sodium: 138 mEq/L (ref 135–145)

## 2012-05-01 ENCOUNTER — Other Ambulatory Visit: Payer: Self-pay | Admitting: *Deleted

## 2012-05-01 DIAGNOSIS — E876 Hypokalemia: Secondary | ICD-10-CM

## 2012-05-01 MED ORDER — POTASSIUM CHLORIDE CRYS ER 20 MEQ PO TBCR: EXTENDED_RELEASE_TABLET | ORAL | Status: DC

## 2012-05-01 NOTE — Progress Notes (Signed)
Quick Note:  Pt informed, she was questioning if she is to remain on 1 tab BID. ______

## 2012-05-01 NOTE — Progress Notes (Signed)
Quick Note:  Pt informed on home VM, refilled med, lab ordered ______

## 2012-05-27 ENCOUNTER — Other Ambulatory Visit: Payer: Self-pay | Admitting: Family Medicine

## 2012-05-30 ENCOUNTER — Encounter: Payer: Self-pay | Admitting: Family Medicine

## 2012-05-30 ENCOUNTER — Ambulatory Visit (INDEPENDENT_AMBULATORY_CARE_PROVIDER_SITE_OTHER): Payer: Medicare Other | Admitting: Family Medicine

## 2012-05-30 VITALS — BP 122/70 | Temp 98.1°F

## 2012-05-30 DIAGNOSIS — I10 Essential (primary) hypertension: Secondary | ICD-10-CM

## 2012-05-30 DIAGNOSIS — E876 Hypokalemia: Secondary | ICD-10-CM

## 2012-05-30 LAB — BASIC METABOLIC PANEL
BUN: 17 mg/dL (ref 6–23)
Chloride: 104 mEq/L (ref 96–112)
Potassium: 4.2 mEq/L (ref 3.5–5.1)

## 2012-05-30 NOTE — Progress Notes (Signed)
  Subjective:    Patient ID: [name] [name], female    DOB: 10-24-1927, 78 y.o.   MRN: [id number]  HPI  Followup regarding recent weight loss and hypokalemia. Reduced her HCTZ to one half tablet daily. Her potassium came up with supplementation from 2.7-4.3.  Repeat today and if normal consider tapering back potassium to once daily. Blood pressures been stable. She has progressed with physical therapy. Right knee did improve some following corticosteroid injection. She is ambulating with walker and occasionally with a cane. Blood pressures been stable. Her weight is stable at 140 pounds. Good appetite. Still has some sleep difficulties off and on. She has increased her gabapentin to 2 at night   Review of Systems  Constitutional: Negative for appetite change and unexpected weight change.  Respiratory: Negative for cough and shortness of breath.   Cardiovascular: Negative for chest pain.  Genitourinary: Negative for dysuria.  Neurological: Negative for dizziness and syncope.       Objective:   Physical Exam  Constitutional: She appears well-developed and well-nourished. No distress.  Neck: Neck supple.  Cardiovascular: Normal rate and regular rhythm.   Pulmonary/Chest: Effort normal and breath sounds normal. No respiratory distress. She has no wheezes. She has no rales.  Musculoskeletal: She exhibits no edema.          Assessment & Plan:  #1 recent hypokalemia. Recheck basic metabolic panel #2 hypertension. Stable. Continue current medications  #3 recent weight loss which has stabilized with Remeron.

## 2012-05-31 NOTE — Progress Notes (Signed)
Quick Note:  Pt informed on home VM ______ 

## 2012-06-26 ENCOUNTER — Other Ambulatory Visit: Payer: Self-pay | Admitting: Family Medicine

## 2012-08-30 ENCOUNTER — Encounter: Payer: Self-pay | Admitting: Family Medicine

## 2012-08-30 ENCOUNTER — Ambulatory Visit (INDEPENDENT_AMBULATORY_CARE_PROVIDER_SITE_OTHER): Payer: Medicare Other | Admitting: Family Medicine

## 2012-08-30 VITALS — BP 140/90 | Temp 98.5°F | Wt 146.0 lb

## 2012-08-30 DIAGNOSIS — E785 Hyperlipidemia, unspecified: Secondary | ICD-10-CM

## 2012-08-30 DIAGNOSIS — I10 Essential (primary) hypertension: Secondary | ICD-10-CM

## 2012-08-30 DIAGNOSIS — G4700 Insomnia, unspecified: Secondary | ICD-10-CM

## 2012-08-30 DIAGNOSIS — E876 Hypokalemia: Secondary | ICD-10-CM

## 2012-08-30 LAB — BASIC METABOLIC PANEL
Calcium: 10.1 mg/dL (ref 8.4–10.5)
Creatinine, Ser: 1 mg/dL (ref 0.4–1.2)
GFR: 56.98 mL/min — ABNORMAL LOW (ref >60.00)
Sodium: 138 mEq/L (ref 135–145)

## 2012-08-30 MED ORDER — MIRTAZAPINE 15 MG PO TBDP: 15.0000 mg | ORAL_TABLET | Freq: Every day | ORAL | Status: DC

## 2012-08-30 MED ORDER — POTASSIUM CHLORIDE CRYS ER 20 MEQ PO TBCR: 20.0000 meq | EXTENDED_RELEASE_TABLET | Freq: Every day | ORAL | Status: DC

## 2012-08-30 NOTE — Progress Notes (Signed)
  Subjective:    Patient ID: [name] [name], female    DOB: 1927/08/15, 79 y.o.   MRN: [id number]  HPI Medical followup. History of CAD, hypertension, hyperlipidemia, gout, history of hyponatremia, and chronic neuropathic pain. Neuropathy pains well controlled with gabapentin currently only taking 200 mg at night. She does have some insomnia issues which is not attributed to pain. She has difficulty with falling asleep. Sometimes does watch television late at night. No caffeine use. No alcohol use.  Recent problems with weight loss. This stabilized and is improved with Remeron. Her weight is up 6 pounds from last visit. Remeron has helped her mood and appetite but not sleep.  History of hypokalemia. Currently takes potassium supplement. Blood pressure medications remain lisinopril, HCTZ, and metoprolol. No recent chest pains. No dizziness.  Past Medical History  Diagnosis Date  . CAD (coronary artery disease)   . HTN (hypertension)   . Hyperlipidemia   . MI (myocardial infarction) 2002   Past Surgical History  Procedure Date  . Dilation and curettage of uterus 1960  . Hystrectomy   . Cataract extraction 1998    RIGHT AND LEFT EYE  . Cholecystectomy 2000  . Refractive surgery 2002  . Ptca 2002  . Abdominal hysterectomy   . Compression hip screw 12/16/2011    Procedure: COMPRESSION HIP;  Surgeon: Kerrin Champagne, MD;  Location: [facility];  Service: Orthopedics;  Laterality: Right;    reports that she has never smoked. She has never used smokeless tobacco. She reports that she does not drink alcohol or use illicit drugs. family history includes Aneurysm (age of onset:62) in her brother; Cancer (age of onset:83) in her sister; Hypertension in her father; and Pneumonia (age of onset:86) in her mother. Allergies  Allergen Reactions  . Penicillins     REACTION: swelling      Review of Systems  Constitutional: Negative for fatigue.  Eyes: Negative for visual disturbance.  Respiratory:  Negative for cough, chest tightness, shortness of breath and wheezing.   Cardiovascular: Negative for chest pain, palpitations and leg swelling.  Neurological: Negative for dizziness, seizures, syncope, weakness, light-headedness and headaches.       Objective:   Physical Exam  Constitutional: She is oriented to person, place, and time. She appears well-developed and well-nourished.  HENT:  Right Ear: External ear normal.  Left Ear: External ear normal.  Mouth/Throat: Oropharynx is clear and moist.  Neck: Neck supple.  Cardiovascular: Normal rate and regular rhythm.   Pulmonary/Chest: Effort normal and breath sounds normal. No respiratory distress. She has no wheezes. She has no rales.  Musculoskeletal: She exhibits no edema.  Lymphadenopathy:    She has no cervical adenopathy.  Neurological: She is alert and oriented to person, place, and time.          Assessment & Plan:  #1 hypertension. Stable. #2 history of hypokalemia. Recheck basic metabolic panel  #3 insomnia. Sleep hygiene discussed. Handout given. Try to avoid regular use of sedative hypnotics handout given #4 history of peripheral neuropathy. Stable on low-dose gabapentin

## 2012-08-31 NOTE — Progress Notes (Signed)
Quick Note:  Pt informed ______ 

## 2012-09-05 ENCOUNTER — Other Ambulatory Visit: Payer: Self-pay | Admitting: Family Medicine

## 2012-09-12 ENCOUNTER — Telehealth: Payer: Self-pay | Admitting: Family Medicine

## 2012-09-12 NOTE — Telephone Encounter (Signed)
[facility] said [facility] rxs k-dur and mirtazapine 15 mg tablet. Pt needs 90 day supply with 3 [facility]

## 2012-09-13 ENCOUNTER — Other Ambulatory Visit: Payer: Self-pay | Admitting: *Deleted

## 2012-09-13 MED ORDER — POTASSIUM CHLORIDE CRYS ER 20 MEQ PO TBCR: 20.0000 meq | EXTENDED_RELEASE_TABLET | Freq: Every day | ORAL | Status: DC

## 2012-09-13 MED ORDER — MIRTAZAPINE 15 MG PO TBDP: 15.0000 mg | ORAL_TABLET | Freq: Every day | ORAL | Status: DC

## 2012-10-31 ENCOUNTER — Other Ambulatory Visit: Payer: Self-pay | Admitting: Family Medicine

## 2013-02-28 ENCOUNTER — Ambulatory Visit (INDEPENDENT_AMBULATORY_CARE_PROVIDER_SITE_OTHER): Payer: Medicare Other | Admitting: Family Medicine

## 2013-02-28 ENCOUNTER — Encounter: Payer: Self-pay | Admitting: Family Medicine

## 2013-02-28 VITALS — BP 132/80 | HR 68 | Temp 97.6°F | Wt 145.0 lb

## 2013-02-28 DIAGNOSIS — I2510 Atherosclerotic heart disease of native coronary artery without angina pectoris: Secondary | ICD-10-CM

## 2013-02-28 DIAGNOSIS — I10 Essential (primary) hypertension: Secondary | ICD-10-CM

## 2013-02-28 DIAGNOSIS — E785 Hyperlipidemia, unspecified: Secondary | ICD-10-CM

## 2013-02-28 DIAGNOSIS — IMO0002 Reserved for concepts with insufficient information to code with codable children: Secondary | ICD-10-CM

## 2013-02-28 DIAGNOSIS — M792 Neuralgia and neuritis, unspecified: Secondary | ICD-10-CM

## 2013-02-28 LAB — LIPID PANEL
Cholesterol: 152 mg/dL (ref 0–200)
Triglycerides: 171 mg/dL — ABNORMAL HIGH (ref 0.0–149.0)
VLDL: 34.2 mg/dL (ref 0.0–40.0)

## 2013-02-28 LAB — BASIC METABOLIC PANEL
BUN: 12 mg/dL (ref 6–23)
GFR: 57.58 mL/min — ABNORMAL LOW (ref >60.00)
Potassium: 4 mEq/L (ref 3.5–5.1)
Sodium: 135 mEq/L (ref 135–145)

## 2013-02-28 LAB — HEPATIC FUNCTION PANEL
ALT: 12 U/L (ref 0–35)
Bilirubin, Direct: 0.1 mg/dL (ref 0.0–0.3)
Total Bilirubin: 0.6 mg/dL (ref 0.3–1.2)

## 2013-02-28 NOTE — Progress Notes (Signed)
  Subjective:    Patient ID: [name] [name], female    DOB: May 23, 1928, 79 y.o.   MRN: [id number]  HPI Followup multiple medical problems She has history of CAD with stent back in 2002. She's done well since that time. Other medical problems include history of hypertension, hyperlipidemia, gout, chronic neuropathic pain. She has history of some depression and insomnia and remains on Remeron which seems to be helping. Her neuropathic pain is stable.  Blood pressures have been stable. No recent orthostatic symptoms. No recent chest pains. Denies any recent fall. Mood is stable. Pneumovax up-to-date. She declines shingles vaccine. She apparently has no insurance coverage for this.  Past Medical History  Diagnosis Date  . CAD (coronary artery disease)   . HTN (hypertension)   . Hyperlipidemia   . MI (myocardial infarction) 2002   Past Surgical History  Procedure Laterality Date  . Dilation and curettage of uterus  1960  . Hystrectomy    . Cataract extraction  1998    RIGHT AND LEFT EYE  . Cholecystectomy  2000  . Refractive surgery  2002  . Ptca  2002  . Abdominal hysterectomy    . Compression hip screw  12/16/2011    Procedure: COMPRESSION HIP;  Surgeon: Kerrin Champagne, MD;  Location: [facility];  Service: Orthopedics;  Laterality: Right;    reports that she has never smoked. She has never used smokeless tobacco. She reports that she does not drink alcohol or use illicit drugs. family history includes Aneurysm (age of onset: 83) in her brother; Cancer (age of onset: 53) in her sister; Hypertension in her father; and Pneumonia (age of onset: 60) in her mother. Allergies  Allergen Reactions  . Penicillins     REACTION: swelling      Review of Systems  Constitutional: Negative for appetite change, fatigue and unexpected weight change.  Eyes: Negative for visual disturbance.  Respiratory: Negative for cough, chest tightness, shortness of breath and wheezing.   Cardiovascular:  Negative for chest pain, palpitations and leg swelling.  Endocrine: Negative for polydipsia and polyuria.  Neurological: Negative for dizziness, seizures, syncope, weakness, light-headedness and headaches.       Objective:   Physical Exam  Constitutional: She is oriented to person, place, and time. She appears well-developed and well-nourished.  HENT:  Mouth/Throat: Oropharynx is clear and moist.  Cardiovascular: Normal rate.   Pulmonary/Chest: Effort normal and breath sounds normal. No respiratory distress. She has no wheezes. She has no rales.  Musculoskeletal: She exhibits no edema.  Neurological: She is alert and oriented to person, place, and time.          Assessment & Plan:  #1 history of CAD. Symptomatically stable. Continue antiplatelet therapy, statin, beta blocker. #2 hyperlipidemia. Check lipid and hepatic panel #3 hypertension. Stable. Continue current medications #4 history of chronic neuropathic pain. No history of diabetes. Continue gabapentin

## 2013-05-06 ENCOUNTER — Ambulatory Visit (INDEPENDENT_AMBULATORY_CARE_PROVIDER_SITE_OTHER): Payer: Medicare Other | Admitting: Family Medicine

## 2013-05-06 DIAGNOSIS — Z23 Encounter for immunization: Secondary | ICD-10-CM

## 2013-05-11 IMAGING — CR DG HIP (WITH OR WITHOUT PELVIS) 2-3V*L*
3 series · 3 of 3 positions shown · non-contrast
Comparison: None.

CLINICAL DATA: Left hip and leg pain

LEFT HIP - COMPLETE 2+ VIEW

[view not recorded (1 of 3)]
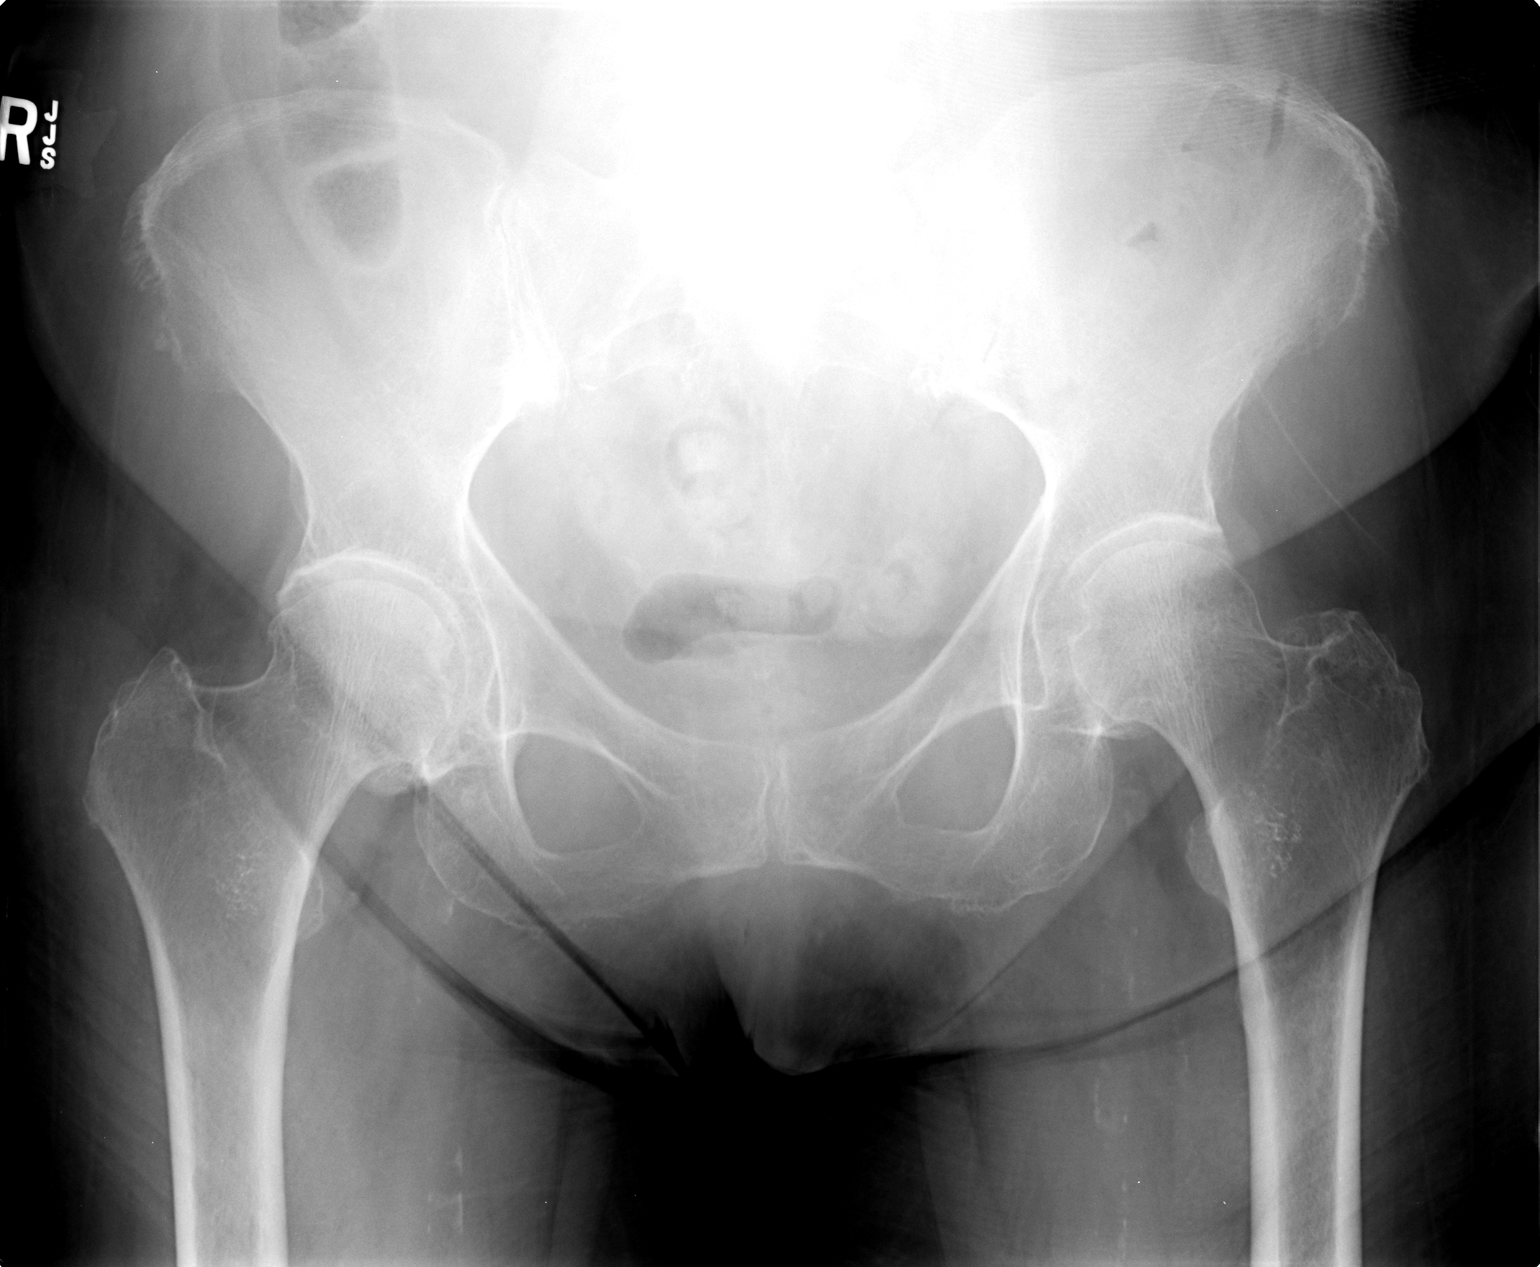

[view not recorded (2 of 3)]
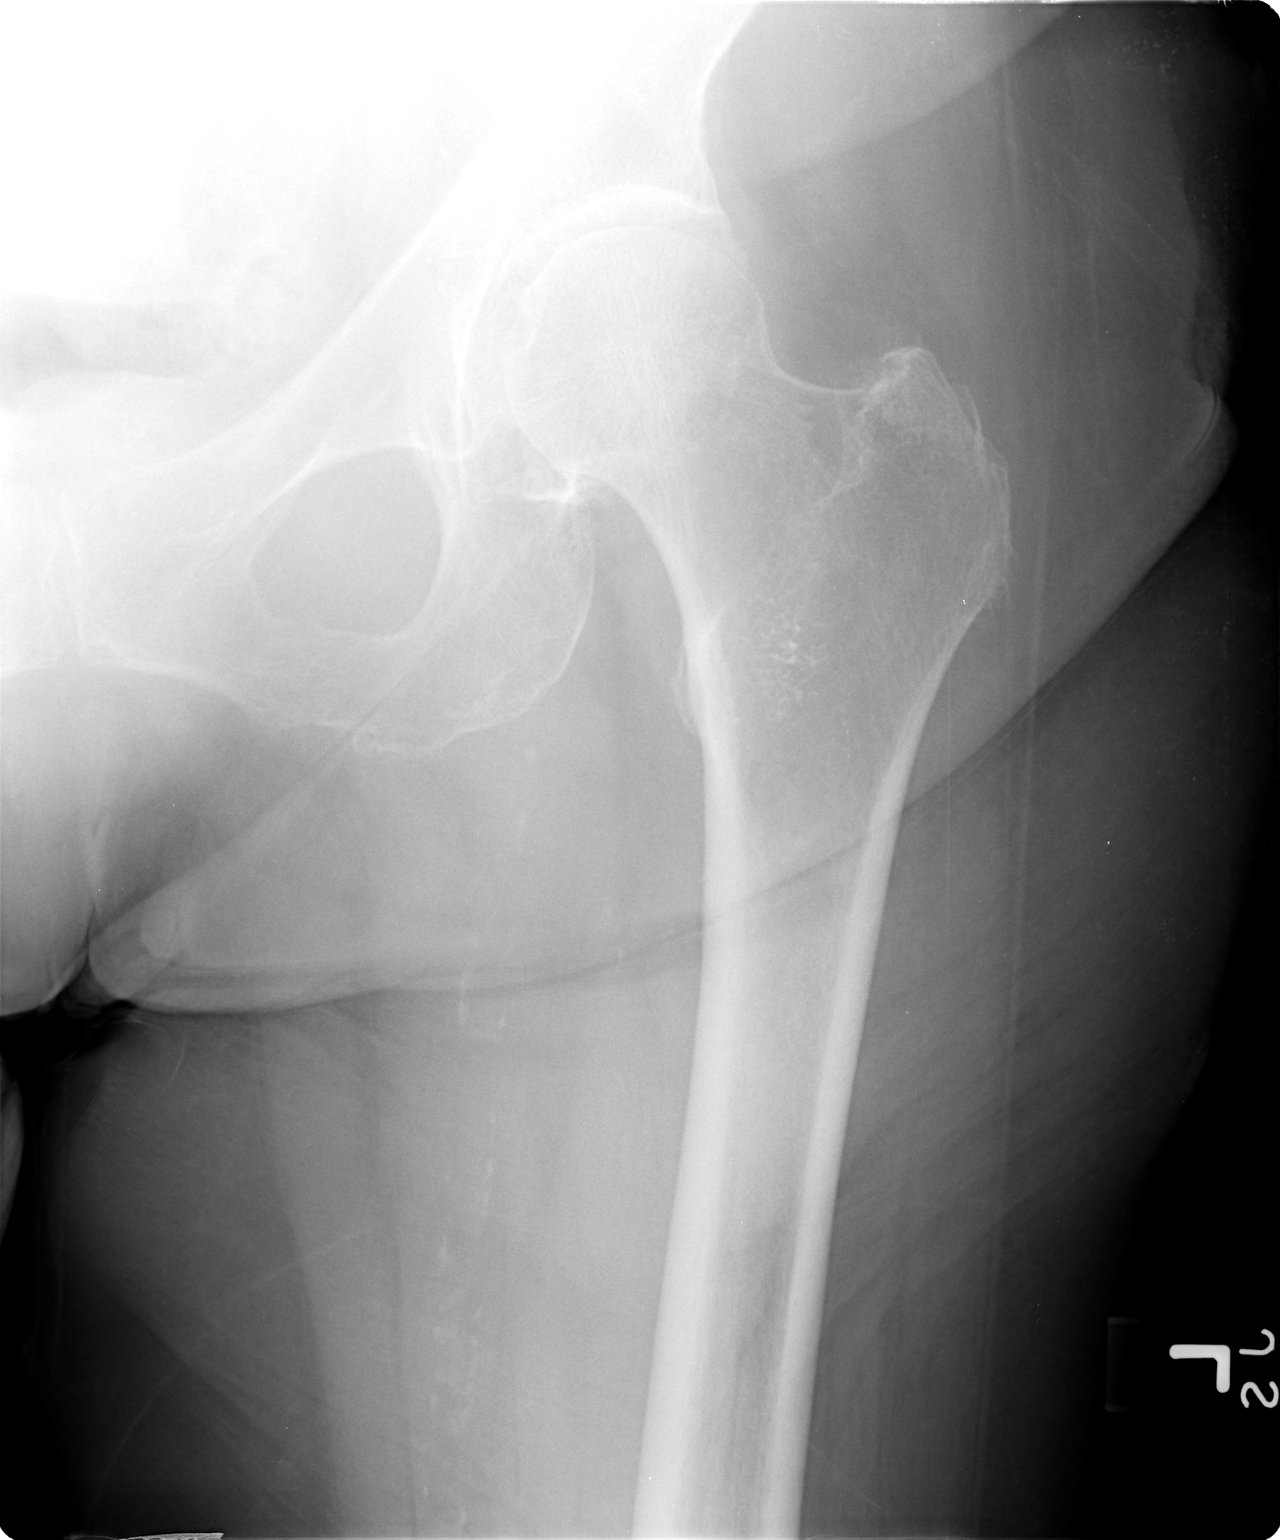

[view not recorded (3 of 3)]
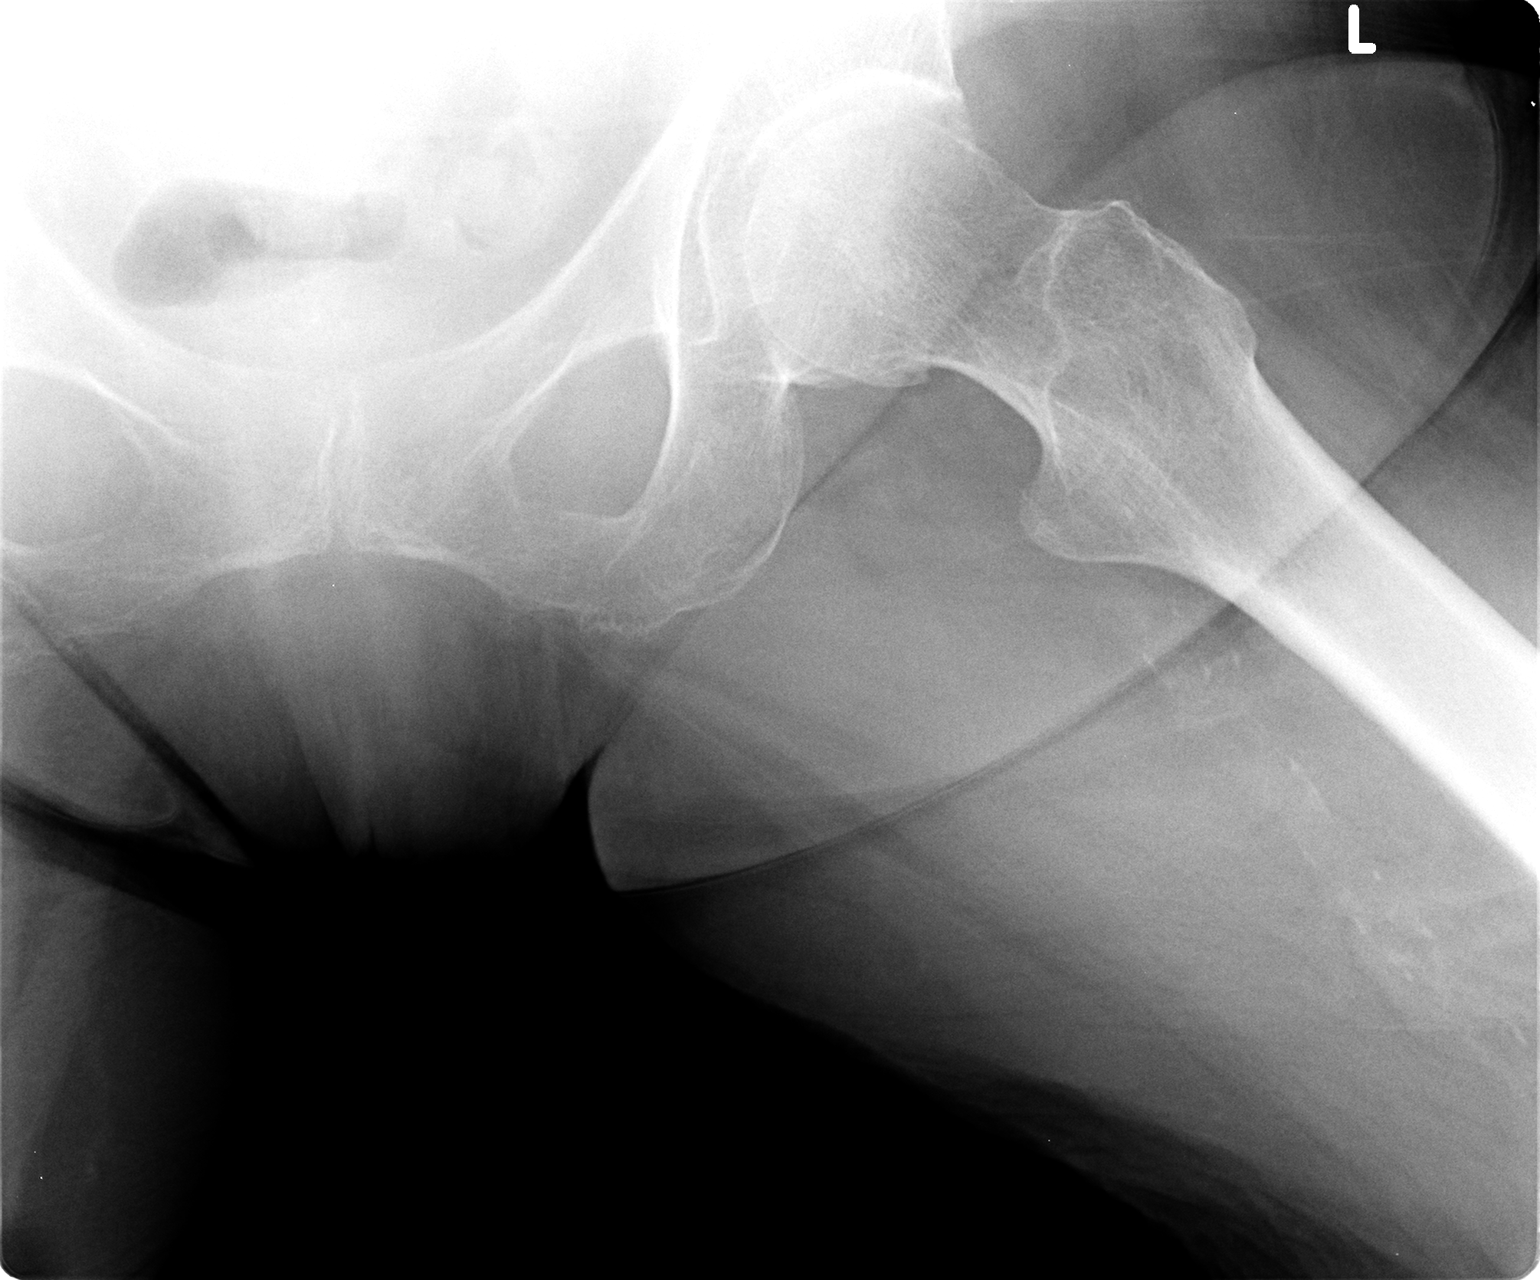

[3 of 3 positions shown; findings below may reference images not displayed]

FINDINGS: Normal alignment and no fracture.  No significant
degenerative change or AVN.  No focal bony lesion.

Mild to moderate degenerative change in the right hip.
IMPRESSION: Negative left hip

## 2013-05-11 IMAGING — CR DG FEMUR 2V*L*
2 series · 2 of 2 positions shown · non-contrast
Comparison: None.

CLINICAL DATA: Left thigh pain.

LEFT FEMUR - 2 VIEW

[view not recorded (1 of 2)]
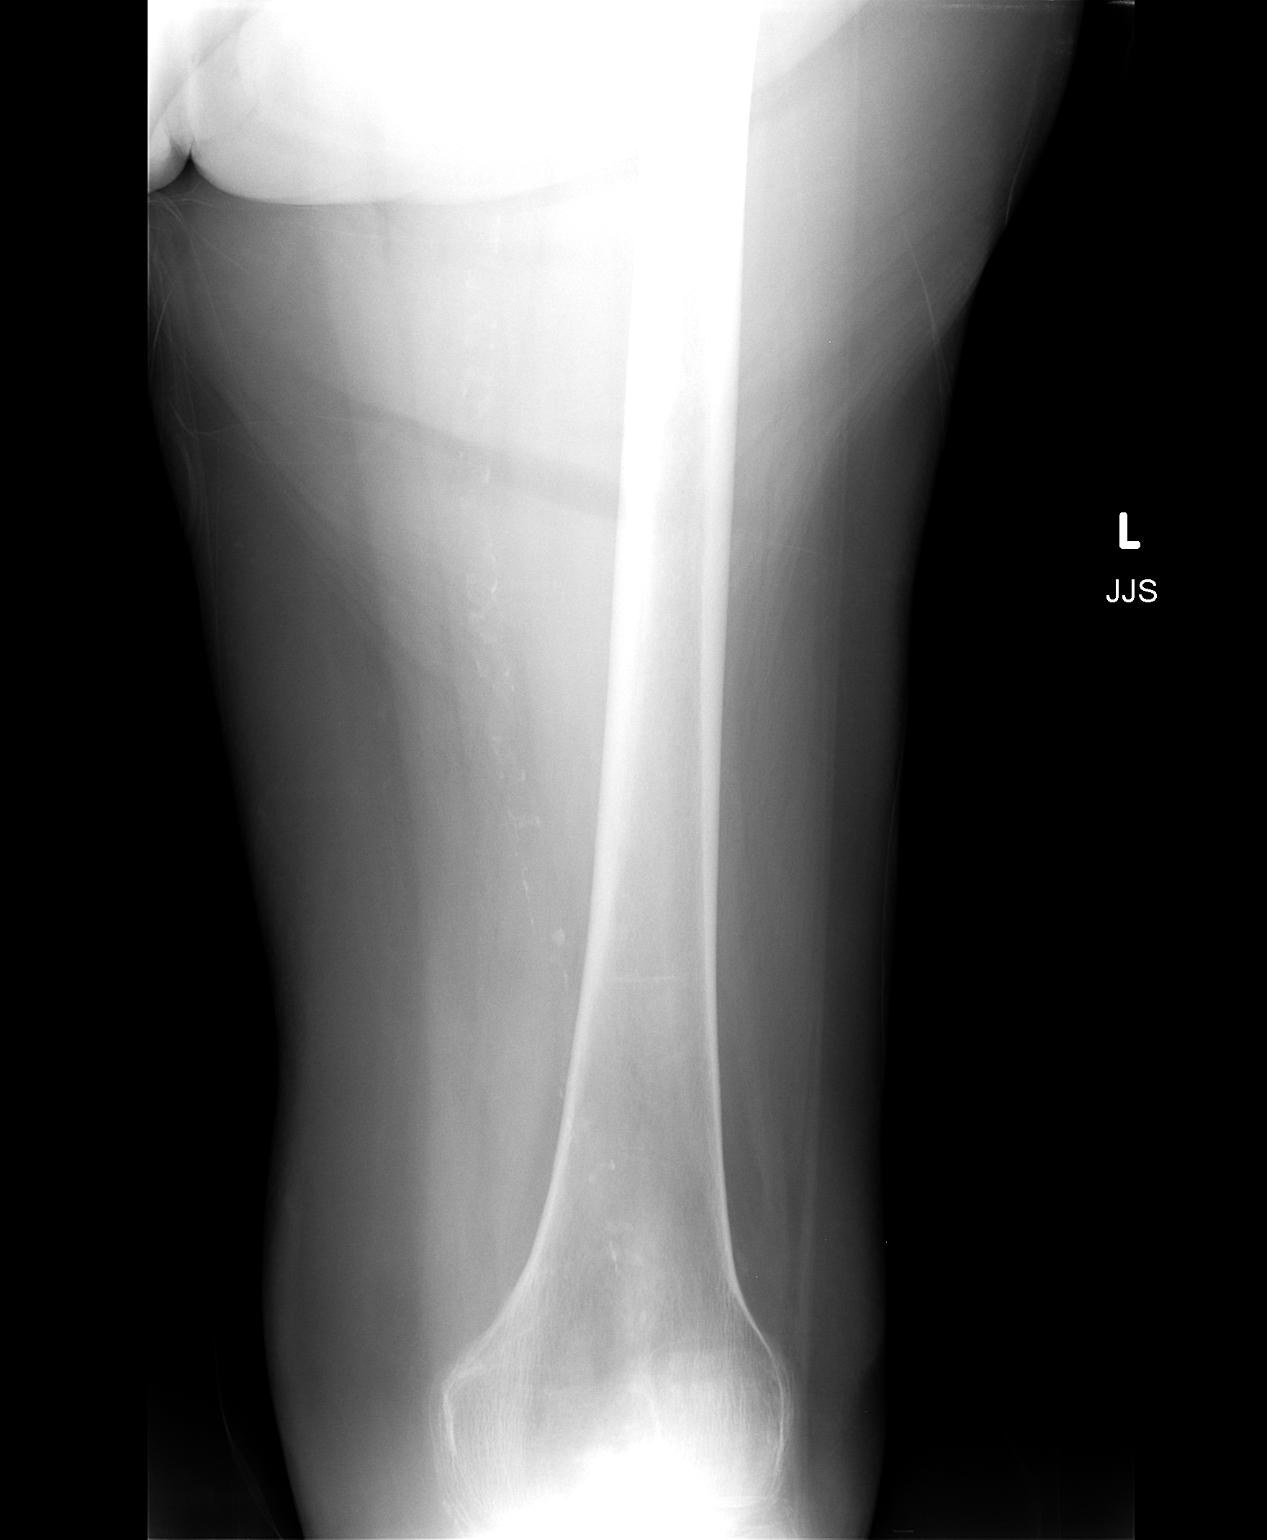

[view not recorded (2 of 2)]
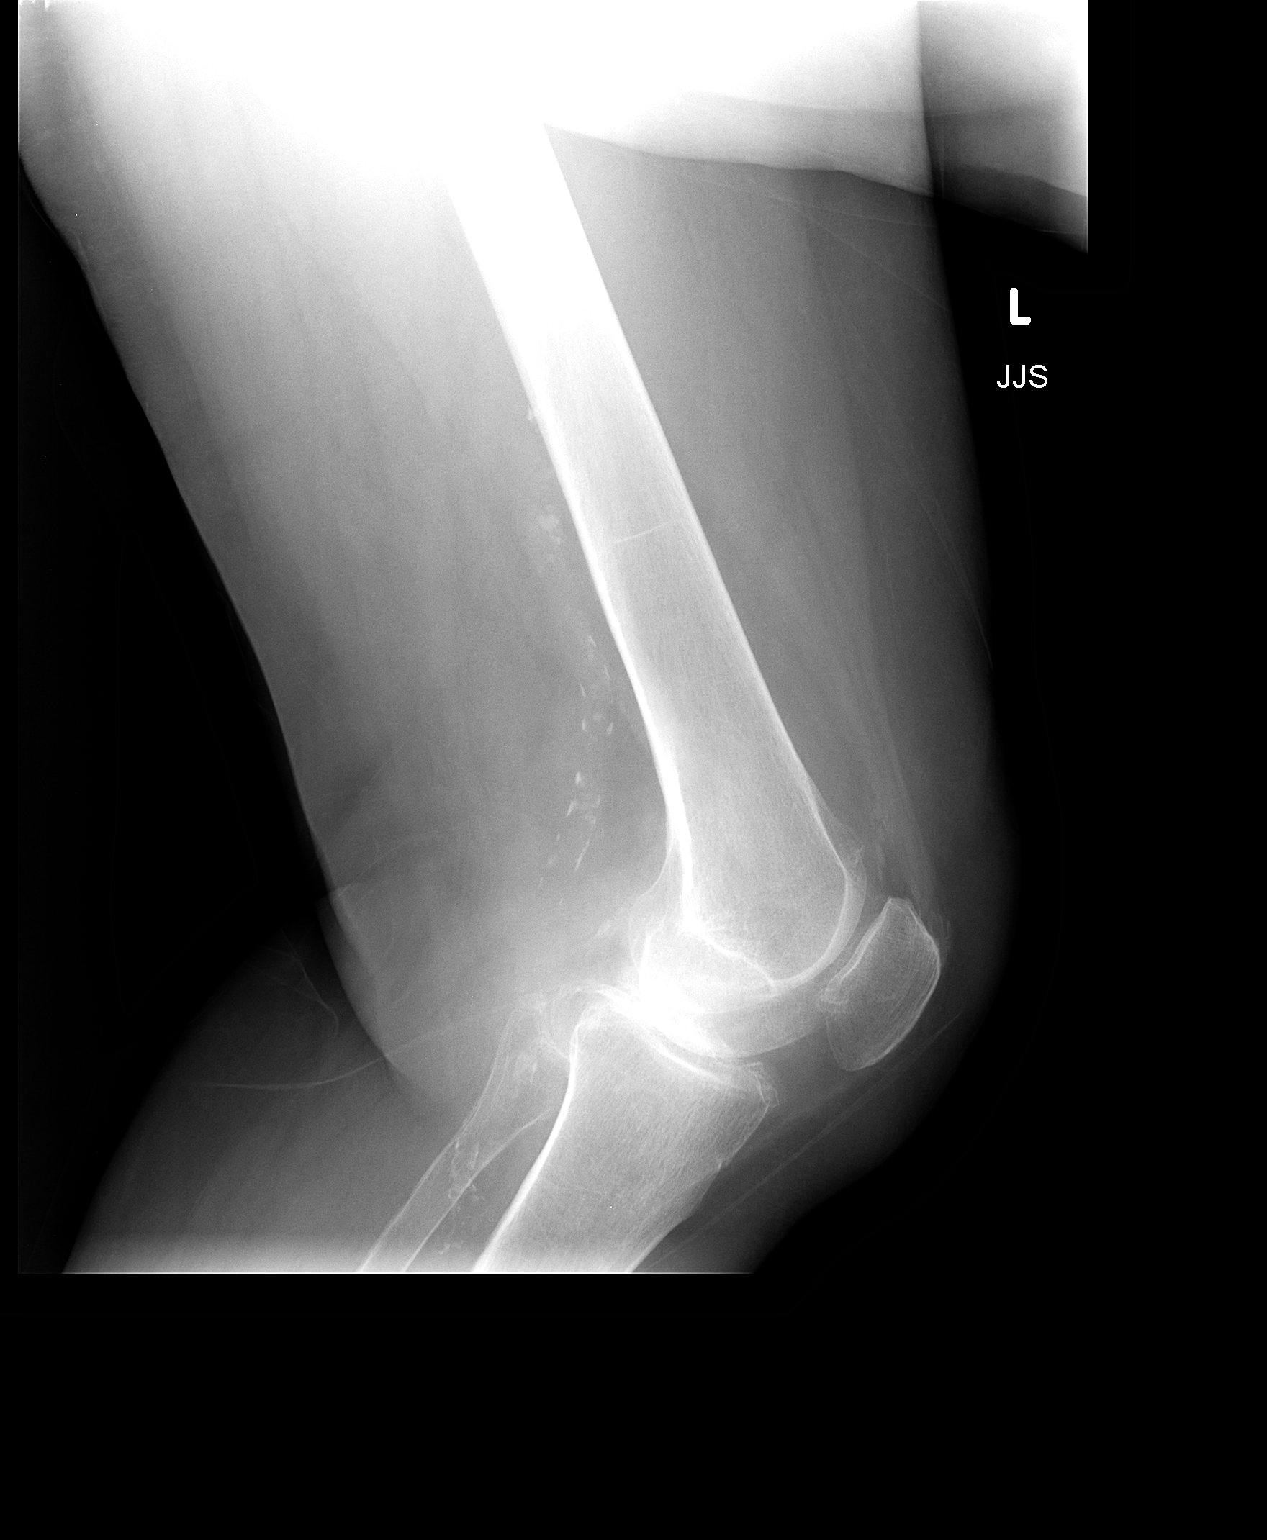

[2 of 2 positions shown; findings below may reference images not displayed]

FINDINGS: Mild degenerative changes in the left hip and left knee.
No joint effusion within the left knee.  Vascular calcifications
present. No acute bony abnormality.  Specifically, no fracture,
subluxation, or dislocation.  Soft tissues are intact.
IMPRESSION: No acute bony abnormality.

## 2013-07-18 ENCOUNTER — Other Ambulatory Visit: Payer: Self-pay | Admitting: Family Medicine

## 2013-08-29 ENCOUNTER — Encounter: Payer: Self-pay | Admitting: Family Medicine

## 2013-08-29 ENCOUNTER — Ambulatory Visit (INDEPENDENT_AMBULATORY_CARE_PROVIDER_SITE_OTHER): Payer: Medicare (Managed Care) | Admitting: Family Medicine

## 2013-08-29 ENCOUNTER — Other Ambulatory Visit: Payer: Self-pay

## 2013-08-29 VITALS — BP 140/72 | HR 61 | Wt 142.0 lb

## 2013-08-29 DIAGNOSIS — M1710 Unilateral primary osteoarthritis, unspecified knee: Secondary | ICD-10-CM

## 2013-08-29 DIAGNOSIS — I10 Essential (primary) hypertension: Secondary | ICD-10-CM

## 2013-08-29 DIAGNOSIS — M1711 Unilateral primary osteoarthritis, right knee: Secondary | ICD-10-CM | POA: Insufficient documentation

## 2013-08-29 DIAGNOSIS — M179 Osteoarthritis of knee, unspecified: Secondary | ICD-10-CM

## 2013-08-29 DIAGNOSIS — IMO0002 Reserved for concepts with insufficient information to code with codable children: Secondary | ICD-10-CM

## 2013-08-29 DIAGNOSIS — E785 Hyperlipidemia, unspecified: Secondary | ICD-10-CM

## 2013-08-29 MED ORDER — METOPROLOL SUCCINATE ER 50 MG PO TB24: ORAL_TABLET | ORAL | Status: DC

## 2013-08-29 MED ORDER — METHYLPREDNISOLONE ACETATE 40 MG/ML IJ SUSP
40.0000 mg | Freq: Once | INTRAMUSCULAR | Status: AC
  Administered 2013-08-29: 40 mg via INTRA_ARTICULAR

## 2013-08-29 NOTE — Progress Notes (Signed)
   Subjective:    Patient ID: [name] [name], female    DOB: [birth year], 80 y.o.   MRN: [id number]  HPI Medical followup Patient's history of CAD, hypertension, hyperlipidemia, osteoarthritis, chronic neuropathy pain. Blood pressures been stable. Medications reviewed and compliant with all. She's refills metoprolol. No recent orthostasis. No chest pains. Takes Lipitor for hyperlipidemia. Labs from last summer reviewed and they were stable at goal. Her weight has been relatively stable. We had added Remeron last year because of weight loss and seemed to stabilize. She's sleeping fairly well.  She's had ongoing and somewhat progressive knee pains right greater than left. Requesting corticosteroid injection which has helped her in the past. She's had previous hip replacement surgery. She is desiring to avoid further surgery on her knees. Denies recent fall  Past Medical History  Diagnosis Date  . CAD (coronary artery disease)   . HTN (hypertension)   . Hyperlipidemia   . MI (myocardial infarction) 2002   Past Surgical History  Procedure Laterality Date  . Dilation and curettage of uterus  1960  . Hystrectomy    . Cataract extraction  1998    RIGHT AND LEFT EYE  . Cholecystectomy  2000  . Refractive surgery  2002  . Ptca  2002  . Abdominal hysterectomy    . Compression hip screw  12/16/2011    Procedure: COMPRESSION HIP;  Surgeon: Kerrin ChampagneJames E Nitka, MD;  Location: [facility];  Service: Orthopedics;  Laterality: Right;    reports that she has never smoked. She has never used smokeless tobacco. She reports that she does not drink alcohol or use illicit drugs. family history includes Aneurysm (age of onset: 1662) in her brother; Cancer (age of onset: 6483) in her sister; Hypertension in her father; Pneumonia (age of onset: 7986) in her mother. Allergies  Allergen Reactions  . Penicillins     REACTION: swelling      Review of Systems  Constitutional: Negative for appetite change and unexpected  weight change.  Eyes: Negative for visual disturbance.  Respiratory: Negative for cough, chest tightness, shortness of breath and wheezing.   Cardiovascular: Negative for chest pain, palpitations and leg swelling.  Musculoskeletal: Positive for arthralgias.  Neurological: Negative for dizziness, seizures, syncope, weakness, light-headedness and headaches.       Objective:   Physical Exam  Constitutional: She is oriented to person, place, and time. She appears well-developed and well-nourished.  Neck: Neck supple. No thyromegaly present.  Cardiovascular: Normal rate.   Pulmonary/Chest: Effort normal and breath sounds normal. No respiratory distress. She has no wheezes. She has no rales.  Musculoskeletal: She exhibits no edema.  Right knee reveals no warmth. No erythema. No ecchymosis. She has crepitus with flexion and extension. No effusion. Moderate medial joint line tenderness  Neurological: She is alert and oriented to person, place, and time.          Assessment & Plan:  #1 hypertension. Stable. Refill metoprolol for one year. Continue other current medications. Blood pressure adequate control #2 history of CAD/hyperlipidemia. Repeat lipids at followup. #3 weight loss which has stabilized on Remeron #4 osteoarthritis, especially right knee. Discussed risk and benefits of corticosteroid injection clear risk of bruising, bleeding, infection. Patient consented. Right knee with Betadine. Using 22-gauge 1/2 inch needle injected 40 mg Depo-Medrol and 2 cc of plain Xylocaine without complication

## 2013-08-29 NOTE — Patient Instructions (Signed)
Osteoarthritis Osteoarthritis is a disease that causes soreness and swelling (inflammation) of a joint. It occurs when the cartilage at the affected joint wears down. Cartilage acts as a cushion, covering the ends of bones where they meet to form a joint. Osteoarthritis is the most common form of arthritis. It often occurs in older people. The joints affected most often by this condition include those in the:  Ends of the fingers.  Thumbs.  Neck.  Lower back.  Knees.  Hips. CAUSES  Over time, the cartilage that covers the ends of bones begins to wear away. This causes bone to rub on bone, producing pain and stiffness in the affected joints.  RISK FACTORS Certain factors can increase your chances of having osteoarthritis, including:  Older age.  Excessive body weight.  Overuse of joints. SIGNS AND SYMPTOMS   Pain, swelling, and stiffness in the joint.  Over time, the joint may lose its normal shape.  Small deposits of bone (osteophytes) may grow on the edges of the joint.  Bits of bone or cartilage can break off and float inside the joint space. This may cause more pain and damage. DIAGNOSIS  Your health care provider will do a physical exam and ask about your symptoms. Various tests may be ordered, such as:  X-rays of the affected joint.  An MRI scan.  Blood tests to rule out other types of arthritis.  Joint fluid tests. This involves using a needle to draw fluid from the joint and examining the fluid under a microscope. TREATMENT  Goals of treatment are to control pain and improve joint function. Treatment plans may include:  A prescribed exercise program that allows for rest and joint relief.  A weight control plan.  Pain relief techniques, such as:  Properly applied heat and cold.  Electric pulses delivered to nerve endings under the skin (transcutaneous electrical nerve stimulation, TENS).  Massage.  Certain nutritional supplements.  Medicines to  control pain, such as:  Acetaminophen.  Nonsteroidal anti-inflammatory drugs (NSAIDs), such as naproxen.  Narcotic or central-acting agents, such as tramadol.  Corticosteroids. These can be given orally or as an injection.  Surgery to reposition the bones and relieve pain (osteotomy) or to remove loose pieces of bone and cartilage. Joint replacement may be needed in advanced states of osteoarthritis. HOME CARE INSTRUCTIONS   Only take over-the-counter or prescription medicines as directed by your health care provider. Take all medicines exactly as instructed.  Maintain a healthy weight. Follow your health care provider's instructions for weight control. This may include dietary instructions.  Exercise as directed. Your health care provider can recommend specific types of exercise. These may include:  Strengthening exercises These are done to strengthen the muscles that support joints affected by arthritis. They can be performed with weights or with exercise bands to add resistance.  Aerobic activities These are exercises, such as brisk walking or low-impact aerobics, that get your heart pumping.  Range-of-motion activities These keep your joints limber.  Balance and agility exercises These help you maintain daily living skills.  Rest your affected joints as directed by your health care provider.  Follow up with your health care provider as directed. SEEK MEDICAL CARE IF:   Your skin turns red.  You develop a rash in addition to your joint pain.  You have worsening joint pain. SEEK IMMEDIATE MEDICAL CARE IF:  You have a significant loss of weight or appetite.  You have a fever along with joint or muscle aches.  You have   night sweats. FOR MORE INFORMATION  National Institute of Arthritis and Musculoskeletal and Skin Diseases: www.niams.nih.gov National Institute on Aging: www.nia.nih.gov American College of Rheumatology: www.rheumatology.org Document Released: 07/24/2005  Document Revised: 05/14/2013 Document Reviewed: 03/31/2013 ExitCare Patient Information 2014 [employer].  

## 2013-08-29 NOTE — Progress Notes (Signed)
Pre visit review using our clinic review tool, if applicable. No additional management support is needed unless otherwise documented below in the visit note. 

## 2013-09-10 ENCOUNTER — Telehealth: Payer: Self-pay | Admitting: Family Medicine

## 2013-09-10 NOTE — Telephone Encounter (Signed)
Relevant patient education mailed to patient.  

## 2013-10-16 ENCOUNTER — Other Ambulatory Visit: Payer: Self-pay | Admitting: Family Medicine

## 2013-11-05 ENCOUNTER — Other Ambulatory Visit: Payer: Self-pay | Admitting: Family Medicine

## 2014-01-29 ENCOUNTER — Ambulatory Visit (INDEPENDENT_AMBULATORY_CARE_PROVIDER_SITE_OTHER): Payer: Medicare (Managed Care) | Admitting: Family Medicine

## 2014-01-29 ENCOUNTER — Encounter: Payer: Self-pay | Admitting: Family Medicine

## 2014-01-29 VITALS — BP 134/74 | HR 63 | Temp 97.8°F

## 2014-01-29 DIAGNOSIS — M170 Bilateral primary osteoarthritis of knee: Secondary | ICD-10-CM

## 2014-01-29 DIAGNOSIS — M1710 Unilateral primary osteoarthritis, unspecified knee: Secondary | ICD-10-CM

## 2014-01-29 DIAGNOSIS — W19XXXA Unspecified fall, initial encounter: Secondary | ICD-10-CM

## 2014-01-29 MED ORDER — METHYLPREDNISOLONE ACETATE 40 MG/ML IJ SUSP
40.0000 mg | Freq: Once | INTRAMUSCULAR | Status: AC
  Administered 2014-01-29: 40 mg via INTRA_ARTICULAR

## 2014-01-29 NOTE — Progress Notes (Signed)
Pre visit review using our clinic review tool, if applicable. No additional management support is needed unless otherwise documented below in the visit note. 

## 2014-01-29 NOTE — Patient Instructions (Signed)
Wear and Tear Disorders of the Knee (Arthritis, Osteoarthritis)  Everyone will experience wear and tear injuries (arthritis, osteoarthritis) of the knee. These are the changes we all get as we age. They come from the joint stress of daily living. The amount of cartilage damage in your knee and your symptoms determine if you need surgery. Mild problems require approximately two months recovery time. More severe problems take several months to recover. With mild problems, your surgeon may find worn and rough cartilage surfaces. With severe changes, your surgeon may find cartilage that has completely worn away and exposed the bone. Loose bodies of bone and cartilage, bone spurs (excess bone growth), and injuries to the menisci (cushions between the large bones of your leg) are also common. All of these problems can cause pain.  For a mild wear and tear problem, rough cartilage may simply need to be shaved and smoothed. For more severe problems with areas of exposed bone, your surgeon may use an instrument for roughing up the bone surfaces to stimulate new cartilage growth. Loose bodies are usually removed. Torn menisci may be trimmed or repaired.  ABOUT THE ARTHROSCOPIC PROCEDURE  Arthroscopy is a surgical technique. It allows your orthopedic surgeon to diagnose and treat your knee injury with accuracy. The surgeon looks into your knee through a small scope. The scope is like a small (pencil-sized) telescope. Arthroscopy is less invasive than open knee surgery. You can expect a more rapid recovery. After the procedure, you will be moved to a recovery area until most of the effects of the medication have worn off. Your caregiver will discuss the test results with you.  RECOVERY  The severity of the arthritis and the type of procedure performed will determine recovery time. Other important factors include age, physical condition, medical conditions, and the type of rehabilitation program. Strengthening your muscles after  arthroscopy helps guarantee a better recovery. Follow your caregiver's instructions. Use crutches, rest, elevate, ice, and do knee exercises as instructed. Your caregivers will help you and instruct you with exercises and other physical therapy required to regain your mobility, muscle strength, and functioning following surgery. Only take over-the-counter or prescription medicines for pain, discomfort, or fever as directed by your caregiver.   SEEK MEDICAL CARE IF:   · There is increased bleeding (more than a small spot) from the wound.  · You notice redness, swelling, or increasing pain in the wound.  · Pus is coming from wound.  · You develop an unexplained oral temperature above 102° F (38.9° C) , or as your caregiver suggests.  · You notice a foul smell coming from the wound or dressing.  · You have severe pain with motion of the knee.  SEEK IMMEDIATE MEDICAL CARE IF:   · You develop a rash.  · You have difficulty breathing.  · You have any allergic problems.  MAKE SURE YOU:   · Understand these instructions.  · Will watch your condition.  · Will get help right away if you are not doing well or get worse.  Document Released: 07/21/2000 Document Revised: 10/16/2011 Document Reviewed: 12/18/2007  ExitCare® Patient Information ©2015 [employer]. This information is not intended to replace advice given to you by your health care provider. Make sure you discuss any questions you have with your health care provider.

## 2014-01-29 NOTE — Progress Notes (Signed)
   Subjective:    Patient ID: [name] [name], female    DOB: 07/18/1928, 80 y.o.   MRN: [id number]  Fall Pertinent negatives include no fever.   Patient had fall yesterday. She has fairly severe osteoarthritis of both knees. We injected her right knee last January and she got several months of improvement. She's had recent increased stiffness and crepitus and pain with ambulation. She fell yesterday and she attributed that to the pain in her right knee. No definite weakness. No back pain. No lower extremity numbness. She denies any hip pain.  She uses a walker to help ambulate. She is still getting physical therapy 3 times per week has been doing some knee strengthening exercises.  Past Medical History  Diagnosis Date  . CAD (coronary artery disease)   . HTN (hypertension)   . Hyperlipidemia   . MI (myocardial infarction) 2002   Past Surgical History  Procedure Laterality Date  . Dilation and curettage of uterus  1960  . Hystrectomy    . Cataract extraction  1998    RIGHT AND LEFT EYE  . Cholecystectomy  2000  . Refractive surgery  2002  . Ptca  2002  . Abdominal hysterectomy    . Compression hip screw  12/16/2011    Procedure: COMPRESSION HIP;  Surgeon: Kerrin ChampagneJames E Nitka, MD;  Location: [facility];  Service: Orthopedics;  Laterality: Right;    reports that she has never smoked. She has never used smokeless tobacco. She reports that she does not drink alcohol or use illicit drugs. family history includes Aneurysm (age of onset: 1262) in her brother; Cancer (age of onset: 7083) in her sister; Hypertension in her father; Pneumonia (age of onset: 286) in her mother. Allergies  Allergen Reactions  . Penicillins     REACTION: swelling      Review of Systems  Constitutional: Negative for fever and chills.  Respiratory: Negative for cough.   Cardiovascular: Negative for chest pain.  Musculoskeletal: Positive for arthralgias.  Neurological: Negative for syncope.       Objective:   Physical Exam  Constitutional: She appears well-developed and well-nourished.  Cardiovascular: Normal rate.   Pulmonary/Chest: Effort normal and breath sounds normal. No respiratory distress. She has no wheezes. She has no rales.  Musculoskeletal:  Right knee reveals crepitus. No ecchymosis. No erythema. No localized point tenderness. Mild medial joint line tenderness.  Neurological: She has normal reflexes.  No focal strength deficits lower extremities. She is able to dorsiflex and plantar flex without difficulty          Assessment & Plan:  Osteoarthritis right knee greater than left. We discussed risk and benefits of corticosteroid injection patient consents. Prepped knee with Betadine. Using 23-gauge one and 1/2 inch needle injected 40 mg Depo-Medrol and 2 cc of plain Xylocaine. Patient tolerated well

## 2014-02-19 ENCOUNTER — Encounter: Payer: Self-pay | Admitting: Family Medicine

## 2014-02-19 ENCOUNTER — Ambulatory Visit (INDEPENDENT_AMBULATORY_CARE_PROVIDER_SITE_OTHER): Payer: Medicare (Managed Care) | Admitting: Family Medicine

## 2014-02-19 VITALS — BP 132/76 | HR 64 | Temp 98.1°F

## 2014-02-19 DIAGNOSIS — G4700 Insomnia, unspecified: Secondary | ICD-10-CM

## 2014-02-19 DIAGNOSIS — M1710 Unilateral primary osteoarthritis, unspecified knee: Secondary | ICD-10-CM

## 2014-02-19 DIAGNOSIS — E785 Hyperlipidemia, unspecified: Secondary | ICD-10-CM

## 2014-02-19 DIAGNOSIS — M170 Bilateral primary osteoarthritis of knee: Secondary | ICD-10-CM

## 2014-02-19 DIAGNOSIS — I10 Essential (primary) hypertension: Secondary | ICD-10-CM

## 2014-02-19 DIAGNOSIS — F5104 Psychophysiologic insomnia: Secondary | ICD-10-CM

## 2014-02-19 LAB — HEPATIC FUNCTION PANEL
ALT: 9 U/L (ref 0–35)
AST: 15 U/L (ref 0–37)
Albumin: 3.8 g/dL (ref 3.5–5.2)
Alkaline Phosphatase: 79 U/L (ref 39–117)
BILIRUBIN DIRECT: 0.2 mg/dL (ref 0.0–0.3)
Total Bilirubin: 0.6 mg/dL (ref 0.2–1.2)
Total Protein: 6.7 g/dL (ref 6.0–8.3)

## 2014-02-19 LAB — LIPID PANEL
CHOLESTEROL: 132 mg/dL (ref 0–200)
HDL: 59.3 mg/dL (ref >39.00)
LDL Cholesterol: 56 mg/dL (ref 0–99)
NONHDL: 72.7
Total CHOL/HDL Ratio: 2
Triglycerides: 86 mg/dL (ref 0.0–149.0)
VLDL: 17.2 mg/dL (ref 0.0–40.0)

## 2014-02-19 LAB — BASIC METABOLIC PANEL
BUN: 14 mg/dL (ref 6–23)
CALCIUM: 10.1 mg/dL (ref 8.4–10.5)
CO2: 24 mEq/L (ref 19–32)
CREATININE: 0.9 mg/dL (ref 0.4–1.2)
Chloride: 97 mEq/L (ref 96–112)
GFR: 64.2 mL/min (ref >60.00)
Glucose, Bld: 95 mg/dL (ref 70–99)
Potassium: 3.7 mEq/L (ref 3.5–5.1)
Sodium: 132 mEq/L — ABNORMAL LOW (ref 135–145)

## 2014-02-19 NOTE — Progress Notes (Signed)
Pre visit review using our clinic review tool, if applicable. No additional management support is needed unless otherwise documented below in the visit note. 

## 2014-02-19 NOTE — Patient Instructions (Signed)
Insomnia Insomnia is frequent trouble falling and/or staying asleep. Insomnia can be a long term problem or a short term problem. Both are common. Insomnia can be a short term problem when the wakefulness is related to a certain stress or worry. Long term insomnia is often related to ongoing stress during waking hours and/or poor sleeping habits. Overtime, sleep deprivation itself can make the problem worse. Every little thing feels more severe because you are overtired and your ability to cope is decreased. CAUSES   Stress, anxiety, and depression.  Poor sleeping habits.  Distractions such as TV in the bedroom.  Naps close to bedtime.  Engaging in emotionally charged conversations before bed.  Technical reading before sleep.  Alcohol and other sedatives. They may make the problem worse. They can hurt normal sleep patterns and normal dream activity.  Stimulants such as caffeine for several hours prior to bedtime.  Pain syndromes and shortness of breath can cause insomnia.  Exercise late at night.  Changing time zones may cause sleeping problems (jet lag). It is sometimes helpful to have someone observe your sleeping patterns. They should look for periods of not breathing during the night (sleep apnea). They should also look to see how long those periods last. If you live alone or observers are uncertain, you can also be observed at a sleep clinic where your sleep patterns will be professionally monitored. Sleep apnea requires a checkup and treatment. Give your caregivers your medical history. Give your caregivers observations your family has made about your sleep.  SYMPTOMS   Not feeling rested in the morning.  Anxiety and restlessness at bedtime.  Difficulty falling and staying asleep. TREATMENT   Your caregiver may prescribe treatment for an underlying medical disorders. Your caregiver can give advice or help if you are using alcohol or other drugs for self-medication. Treatment  of underlying problems will usually eliminate insomnia problems.  Medications can be prescribed for short time use. They are generally not recommended for lengthy use.  Over-the-counter sleep medicines are not recommended for lengthy use. They can be habit forming.  You can promote easier sleeping by making lifestyle changes such as:  Using relaxation techniques that help with breathing and reduce muscle tension.  Exercising earlier in the day.  Changing your diet and the time of your last meal. No night time snacks.  Establish a regular time to go to bed.  Counseling can help with stressful problems and worry.  Soothing music and white noise may be helpful if there are background noises you cannot remove.  Stop tedious detailed work at least one hour before bedtime. HOME CARE INSTRUCTIONS   Keep a diary. Inform your caregiver about your progress. This includes any medication side effects. See your caregiver regularly. Take note of:  Times when you are asleep.  Times when you are awake during the night.  The quality of your sleep.  How you feel the next day. This information will help your caregiver care for you.  Get out of bed if you are still awake after 15 minutes. Read or do some quiet activity. Keep the lights down. Wait until you feel sleepy and go back to bed.  Keep regular sleeping and waking hours. Avoid naps.  Exercise regularly.  Avoid distractions at bedtime. Distractions include watching television or engaging in any intense or detailed activity like attempting to balance the household checkbook.  Develop a bedtime ritual. Keep a familiar routine of bathing, brushing your teeth, climbing into bed at the same   time each night, listening to soothing music. Routines increase the success of falling to sleep faster.  Use relaxation techniques. This can be using breathing and muscle tension release routines. It can also include visualizing peaceful scenes. You can  also help control troubling or intruding thoughts by keeping your mind occupied with boring or repetitive thoughts like the old concept of counting sheep. You can make it more creative like imagining planting one beautiful flower after another in your backyard garden.  During your day, work to eliminate stress. When this is not possible use some of the previous suggestions to help reduce the anxiety that accompanies stressful situations. MAKE SURE YOU:   Understand these instructions.  Will watch your condition.  Will get help right away if you are not doing well or get worse. Document Released: 07/21/2000 Document Revised: 10/16/2011 Document Reviewed: 08/21/2007 ExitCare Patient Information 2015 [employer]. This information is not intended to replace advice given to you by your health care provider. Make sure you discuss any questions you have with your health care provider.  

## 2014-02-19 NOTE — Progress Notes (Signed)
   Subjective:    Patient ID: [name] MoriHelen Guhl, female    DOB: May 16, 1928, 80 y.o.   MRN: [id number]  HPI Patient has chronic problems including osteoarthritis, CAD, hypertension, hyperlipidemia, gout. She had injection of her knee recently secondary to severe arthritis pain and that has significantly improved. She is pleased with the results. Her blood pressures been stable. No orthostasis. Takes atorvastatin for hyperlipidemia. No recent chest pains. Compliant with all meds and no side effects.  Still complaining of difficulty sleeping. Mostly difficulty falling asleep. No caffeine use. No alcohol use. She takes Remeron at night as well as gabapentin but still struggles to fall asleep. Does frequently watch television at night. Does not watch TV in bed. No daytime naps.  Past Medical History  Diagnosis Date  . CAD (coronary artery disease)   . HTN (hypertension)   . Hyperlipidemia   . MI (myocardial infarction) 2002   Past Surgical History  Procedure Laterality Date  . Dilation and curettage of uterus  1960  . Hystrectomy    . Cataract extraction  1998    RIGHT AND LEFT EYE  . Cholecystectomy  2000  . Refractive surgery  2002  . Ptca  2002  . Abdominal hysterectomy    . Compression hip screw  12/16/2011    Procedure: COMPRESSION HIP;  Surgeon: Kerrin ChampagneJames E Nitka, MD;  Location: [facility];  Service: Orthopedics;  Laterality: Right;    reports that she has never smoked. She has never used smokeless tobacco. She reports that she does not drink alcohol or use illicit drugs. family history includes Aneurysm (age of onset: 2662) in her brother; Cancer (age of onset: 3483) in her sister; Hypertension in her father; Pneumonia (age of onset: 1186) in her mother. Allergies  Allergen Reactions  . Penicillins     REACTION: swelling      Review of Systems  Constitutional: Negative for fever, chills and fatigue.  Eyes: Negative for visual disturbance.  Respiratory: Negative for cough, chest tightness,  shortness of breath and wheezing.   Cardiovascular: Negative for chest pain, palpitations and leg swelling.  Endocrine: Negative for polydipsia and polyuria.  Musculoskeletal: Positive for arthralgias.  Neurological: Negative for dizziness, seizures, syncope, weakness, light-headedness and headaches.       Objective:   Physical Exam  Constitutional: She appears well-developed and well-nourished.  Neck: Neck supple. No JVD present.  Cardiovascular: Normal rate and regular rhythm.   Pulmonary/Chest: Effort normal and breath sounds normal. No respiratory distress. She has no wheezes. She has no rales.  Musculoskeletal: She exhibits no edema.  Psychiatric: She has a normal mood and affect. Her behavior is normal.          Assessment & Plan:  #1 hypertension. Well controlled. Check basic metabolic panel #2 hyperlipidemia. Check lipid and hepatic panel. Continue atorvastatin #3 osteoarthritis mostly involving knees. Improved following corticosteroid injection. Exercises as tolerated #4 chronic insomnia. Continue Remeron. Sleep hygiene discussed with handout given. Try to avoid further sedative hypnotics with her age

## 2014-02-20 ENCOUNTER — Ambulatory Visit: Payer: Medicare Other | Admitting: Family Medicine

## 2014-03-23 ENCOUNTER — Telehealth: Payer: Self-pay | Admitting: Family Medicine

## 2014-03-23 ENCOUNTER — Emergency Department (HOSPITAL_COMMUNITY)
Admission: EM | Admit: 2014-03-23 | Discharge: 2014-03-23 | Disposition: A | Discharge status: Home / Self Care | Payer: PRIVATE HEALTH INSURANCE | Attending: Emergency Medicine | Admitting: Emergency Medicine

## 2014-03-23 ENCOUNTER — Encounter (HOSPITAL_COMMUNITY): Payer: Self-pay | Admitting: Emergency Medicine

## 2014-03-23 DIAGNOSIS — Y9389 Activity, other specified: Secondary | ICD-10-CM | POA: Insufficient documentation

## 2014-03-23 DIAGNOSIS — S5010XA Contusion of unspecified forearm, initial encounter: Secondary | ICD-10-CM | POA: Diagnosis not present

## 2014-03-23 DIAGNOSIS — Z7902 Long term (current) use of antithrombotics/antiplatelets: Secondary | ICD-10-CM | POA: Diagnosis not present

## 2014-03-23 DIAGNOSIS — S4980XA Other specified injuries of shoulder and upper arm, unspecified arm, initial encounter: Secondary | ICD-10-CM | POA: Insufficient documentation

## 2014-03-23 DIAGNOSIS — Z79899 Other long term (current) drug therapy: Secondary | ICD-10-CM | POA: Insufficient documentation

## 2014-03-23 DIAGNOSIS — I2510 Atherosclerotic heart disease of native coronary artery without angina pectoris: Secondary | ICD-10-CM | POA: Insufficient documentation

## 2014-03-23 DIAGNOSIS — X500XXA Overexertion from strenuous movement or load, initial encounter: Secondary | ICD-10-CM | POA: Insufficient documentation

## 2014-03-23 DIAGNOSIS — S46909A Unspecified injury of unspecified muscle, fascia and tendon at shoulder and upper arm level, unspecified arm, initial encounter: Secondary | ICD-10-CM | POA: Insufficient documentation

## 2014-03-23 DIAGNOSIS — I252 Old myocardial infarction: Secondary | ICD-10-CM | POA: Insufficient documentation

## 2014-03-23 DIAGNOSIS — Z7982 Long term (current) use of aspirin: Secondary | ICD-10-CM | POA: Diagnosis not present

## 2014-03-23 DIAGNOSIS — I10 Essential (primary) hypertension: Secondary | ICD-10-CM | POA: Diagnosis not present

## 2014-03-23 DIAGNOSIS — Y929 Unspecified place or not applicable: Secondary | ICD-10-CM | POA: Insufficient documentation

## 2014-03-23 DIAGNOSIS — E785 Hyperlipidemia, unspecified: Secondary | ICD-10-CM | POA: Insufficient documentation

## 2014-03-23 DIAGNOSIS — T148XXA Other injury of unspecified body region, initial encounter: Secondary | ICD-10-CM

## 2014-03-23 NOTE — Telephone Encounter (Signed)
Caller: Susan/Patient; Phone: [phone]; Reason for Call: Caregiver is calling back after not getting a call back in 40 minutes after placing her first call.  She ?  If Dr.  Caryl NeverBurchette has any appointments before 1400.  Advised no but Dr.  Pricilla Holmucker has several appointments available this afternoon.  Caller declines these appointments as she has to call pt's neice as she will be the one bringing her in.  Will call back.

## 2014-03-23 NOTE — Telephone Encounter (Signed)
Patient Information:  Caller Name: Darl PikesSusan  Phone: [phone]  Patient: [name] [name], [name] [name]  Gender: Female  DOB: 1928/07/01  Age: 80 Years  PCP: Evelena PeatBurchette, [facility](Family Practice)  Office Follow Up:  Does the office need to follow up with this patient?: Yes  Instructions For The Office: Care giver needs further instructions regarding an appointment.  RN Note:  Care giver reports she feels the patient is not telling the complete truth about what happened to her arm and the current symptoms. Patient refuses to go to the hospital at this time. Offered to schedule an appointment for this afternoon, but the care giver will not be able to bring the patient at that time. Please contact care giver to give further instructions regarding an appointment.  Symptoms  Reason For Call & Symptoms: Right arm pain and bruising from the elbow to the bicep muscle. Slightly warm to the touch.  Reviewed Health History In EMR: Yes  Reviewed Medications In EMR: Yes  Reviewed Allergies In EMR: Yes  Reviewed Surgeries / Procedures: Yes  Date of Onset of Symptoms: 03/22/2014  Guideline(s) Used:  Arm Pain  Disposition Per Guideline:   Go to ED Now  Reason For Disposition Reached:   Age > 40 and no obvious cause for pain, pain still present even when not moving the arm  Advice Given:  N/A  Patient Will Follow Care Advice:  YES

## 2014-03-23 NOTE — ED Notes (Signed)
Pt presents with c/o right arm injury. Pt says that she was pulling herself up on the safety bar on Saturday and she felt a pain in her arm after she was pulling on that arm. Pt denies hitting her right arm on anything. Pt now has a large reddened bruise area to that right upper arm, non-tender to touch.

## 2014-03-23 NOTE — Telephone Encounter (Signed)
Patient Information:  Caller Name: Darl PikesSusan  Phone: [phone]  Patient: Ilda MoriSteffens, [place]  Gender: Female  DOB: 21-Mar-1928  Age: 80 Years  PCP: Evelena PeatBurchette, Bruce (Family Practice)  Office Follow Up:  Does the office need to follow up with this patient?: No  Instructions For The Office: N/A   Symptoms  Reason For Call & Symptoms: Caregiver is very upset and says this is her third time calling; says she is unsure what she should do with the pt; found notes from 1030; ED disposition during triage at that time; says she now has some rt eyelid twitching which is new; says her arm is swollen and purple; informed her that previous note mentioned an ED disp; wants to know what is wrong with her arm; informed her that someone would need to evaluate it to know what was wrong with it; says she was advised 911 by one person; says she plans to call 911 and have her evaluated  Reviewed Health History In EMR: N/A  Reviewed Medications In EMR: N/A  Reviewed Allergies In EMR: N/A  Reviewed Surgeries / Procedures: N/A  Date of Onset of Symptoms: 03/23/2014  Guideline(s) Used:  No Protocol Available - Sick Adult  Disposition Per Guideline:   Go to ED Now  Reason For Disposition Reached:   Nursing judgment  Advice Given:  N/A  Patient Will Follow Care Advice:  YES

## 2014-03-23 NOTE — Telephone Encounter (Signed)
Pt informed that she could come into the office to see Donell BeersMatthew Tucker or ED. Pt wants to go to ED. Dr. Leonard SchwartzB aware

## 2014-03-23 NOTE — Discharge Instructions (Signed)

## 2014-03-23 NOTE — ED Provider Notes (Signed)
CSN: [id number]     Arrival date & time 03/23/14  1259 History   First MD Initiated Contact with Patient 03/23/14 1359     Chief Complaint  Patient presents with  . Arm Injury      HPI  Pt presents with c/o right arm injury. Pt says that she was pulling herself up on the safety bar on Saturday and she felt a pain in her arm after she was pulling on that arm. Pt denies hitting her right arm on anything. Pt now has a large reddened bruise area to that right upper arm, non-tender to touch or movement.       Past Medical History  Diagnosis Date  . CAD (coronary artery disease)   . HTN (hypertension)   . Hyperlipidemia   . MI (myocardial infarction) 2002   Past Surgical History  Procedure Laterality Date  . Dilation and curettage of uterus  1960  . Hystrectomy    . Cataract extraction  1998    RIGHT AND LEFT EYE  . Cholecystectomy  2000  . Refractive surgery  2002  . Ptca  2002  . Abdominal hysterectomy    . Compression hip screw  12/16/2011    Procedure: COMPRESSION HIP;  Surgeon: Kerrin Champagne, MD;  Location: [facility];  Service: Orthopedics;  Laterality: Right;   Family History  Problem Relation Age of Onset  . Hypertension Father     DECEASED  . Cancer Sister 54    BREAST/BONE CA  . Pneumonia Mother 50  . Aneurysm Brother 74    DECEASED   History  Substance Use Topics  . Smoking status: Never Smoker   . Smokeless tobacco: Never Used  . Alcohol Use: No   OB History   Grav Para Term Preterm Abortions TAB SAB Ect Mult Living                 Review of Systems  All other systems reviewed and are negative  Allergies  Penicillins  Home Medications   Prior to Admission medications   Medication Sig Start Date End Date Taking? Authorizing Provider  aspirin 81 MG tablet Take 81 mg by mouth daily.     Yes Historical Provider, MD  atorvastatin (LIPITOR) 20 MG tablet Take 20 mg by mouth daily.   Yes Historical Provider, MD  clopidogrel (PLAVIX) 75 MG tablet Take 75  mg by mouth daily.   Yes Historical Provider, MD  gabapentin (NEURONTIN) 100 MG capsule Take 100 mg by mouth 2 (two) times daily.   Yes Historical Provider, MD  hydrochlorothiazide (HYDRODIURIL) 25 MG tablet Take 25 mg by mouth daily.   Yes Historical Provider, MD  lisinopril (PRINIVIL,ZESTRIL) 20 MG tablet Take 20 mg by mouth daily.   Yes Historical Provider, MD  metoprolol succinate (TOPROL-XL) 50 MG 24 hr tablet TAKE 1 TABLET DAILY 08/29/13  Yes Kristian Covey, MD  mirtazapine (REMERON SOL-TAB) 15 MG disintegrating tablet PLACE 1 TABLET ON TONGUE AT BEDTIME 11/05/13  Yes Kristian Covey, MD  potassium chloride SA (K-DUR,KLOR-CON) 20 MEQ tablet Take 20 mEq by mouth daily.   Yes Historical Provider, MD   BP 139/83  Pulse 65  Temp(Src) 98 F (36.7 C) (Oral)  Resp 18  SpO2 98% Physical Exam  Musculoskeletal: Normal range of motion.       Right shoulder: She exhibits normal range of motion and no tenderness.       Right elbow: She exhibits normal range of motion and no swelling.  No tenderness found.       Arms:  Physical Exam  Nursing note and vitals reviewed. Constitutional: She is oriented to person, place, and time. She appears well-developed and well-nourished. No distress.  HENT:  Head: Normocephalic and atraumatic.  Eyes: Pupils are equal, round, and reactive to light.  Neck: Normal range of motion.  Cardiovascular: Normal rate and intact distal pulses.   Pulmonary/Chest: No respiratory distress.  Abdominal: Normal appearance. She exhibits no distension.  Neurological: She is alert and oriented to person, place, and time. No cranial nerve deficit.  Skin: Skin is warm and dry. No rash noted.  Psychiatric: She has a normal mood and affect. Her behavior is normal.   ED Course  Procedures (including critical care time) Labs Review Labs Reviewed - No data to display  Imaging Review No results found.  I suspect the hematoma is secondary to spontaneous capillary bleed under  the skin.  She has no tenderness to the biceps muscle she has no tenderness to the humerus or elbow.  Basically she has no tenderness to palpation of the hematoma.  She feels comfortable going home and to return she has any further problems.  MDM   Final diagnoses:  Hematoma        Nelia Shiobert L Ramona Ruark, MD 03/23/14 [phone]

## 2014-05-12 ENCOUNTER — Ambulatory Visit (INDEPENDENT_AMBULATORY_CARE_PROVIDER_SITE_OTHER): Payer: PRIVATE HEALTH INSURANCE

## 2014-05-12 DIAGNOSIS — Z23 Encounter for immunization: Secondary | ICD-10-CM

## 2014-06-20 ENCOUNTER — Other Ambulatory Visit: Payer: Self-pay | Admitting: Family Medicine

## 2014-07-10 ENCOUNTER — Other Ambulatory Visit: Payer: Self-pay | Admitting: Family Medicine

## 2014-07-10 ENCOUNTER — Telehealth: Payer: Self-pay | Admitting: *Deleted

## 2014-07-10 MED ORDER — MIRTAZAPINE 15 MG PO TBDP
15.0000 mg | ORAL_TABLET | Freq: Every day | ORAL | Status: DC
Start: 2014-07-10 — End: 2014-07-24

## 2014-07-10 MED ORDER — POTASSIUM CHLORIDE CRYS ER 20 MEQ PO TBCR: 20.0000 meq | EXTENDED_RELEASE_TABLET | Freq: Every day | ORAL | Status: DC

## 2014-07-10 NOTE — Telephone Encounter (Signed)
Ok to refill x1 month since PCP out.

## 2014-07-21 ENCOUNTER — Other Ambulatory Visit: Payer: Self-pay | Admitting: Family Medicine

## 2014-07-24 MED ORDER — POTASSIUM CHLORIDE CRYS ER 20 MEQ PO TBCR: 20.0000 meq | EXTENDED_RELEASE_TABLET | Freq: Every day | ORAL | Status: DC

## 2014-07-24 MED ORDER — MIRTAZAPINE 15 MG PO TBDP: 15.0000 mg | ORAL_TABLET | Freq: Every day | ORAL | Status: DC

## 2014-07-24 NOTE — Addendum Note (Signed)
Addended by: Shelby DubinFLOYD, Osmani Kersten E on: 07/24/2014 01:50 PM   Modules accepted: Orders

## 2014-07-24 NOTE — Telephone Encounter (Signed)
Patient called stating the below requests should have gone to [facility].  She would like for the medication to be sent mail order and cancelled from [facility].

## 2014-07-24 NOTE — Telephone Encounter (Signed)
Rx sent to mail order

## 2014-08-04 ENCOUNTER — Telehealth: Payer: Self-pay

## 2014-08-04 MED ORDER — MIRTAZAPINE 15 MG PO TBDP: 15.0000 mg | ORAL_TABLET | Freq: Every day | ORAL | Status: DC

## 2014-08-04 NOTE — Telephone Encounter (Signed)
Refill request for Mirtazapine 15mg  #90 sent to [facility]

## 2014-08-04 NOTE — Telephone Encounter (Signed)
Rx sent to pharmacy   

## 2014-08-20 ENCOUNTER — Encounter: Payer: Self-pay | Admitting: Family Medicine

## 2014-08-20 ENCOUNTER — Ambulatory Visit (INDEPENDENT_AMBULATORY_CARE_PROVIDER_SITE_OTHER): Payer: Medicare Other | Admitting: Family Medicine

## 2014-08-20 VITALS — BP 128/80 | HR 60 | Temp 97.5°F

## 2014-08-20 DIAGNOSIS — M1711 Unilateral primary osteoarthritis, right knee: Secondary | ICD-10-CM

## 2014-08-20 DIAGNOSIS — E785 Hyperlipidemia, unspecified: Secondary | ICD-10-CM

## 2014-08-20 DIAGNOSIS — R42 Dizziness and giddiness: Secondary | ICD-10-CM

## 2014-08-20 DIAGNOSIS — I10 Essential (primary) hypertension: Secondary | ICD-10-CM

## 2014-08-20 DIAGNOSIS — M179 Osteoarthritis of knee, unspecified: Secondary | ICD-10-CM

## 2014-08-20 MED ORDER — METHYLPREDNISOLONE ACETATE 40 MG/ML IJ SUSP
40.0000 mg | Freq: Once | INTRAMUSCULAR | Status: AC
  Administered 2014-08-20: 40 mg via INTRA_ARTICULAR

## 2014-08-20 NOTE — Progress Notes (Signed)
Pre visit review using our clinic review tool, if applicable. No additional management support is needed unless otherwise documented below in the visit note. 

## 2014-08-20 NOTE — Progress Notes (Signed)
   Subjective:    Patient ID: [name] [name], female    DOB: [birth year], 81 y.o.   MRN: [id number]  HPI   Patient seen for medical follow-up. She has history of osteoarthritis, chronic neuropathy, hyperlipidemia, hypertension, history of CAD. Medications reviewed. Compliant with all. Blood pressure well controlled. She's had some recent dizziness which sounds more like vertigo. No syncope or presyncope. Symptoms are usually very transient and worse with standing and change of position. No recent chest pains.  Labs were checked last summer and stable and at goal. Patient has severe osteoarthritis, especially right knee. She has received great benefit from steroid injections in the past and is requesting the same. Last injection about 6 months ago  Past Medical History  Diagnosis Date  . CAD (coronary artery disease)   . HTN (hypertension)   . Hyperlipidemia   . MI (myocardial infarction) 2002   Past Surgical History  Procedure Laterality Date  . Dilation and curettage of uterus  1960  . Hystrectomy    . Cataract extraction  1998    RIGHT AND LEFT EYE  . Cholecystectomy  2000  . Refractive surgery  2002  . Ptca  2002  . Abdominal hysterectomy    . Compression hip screw  12/16/2011    Procedure: COMPRESSION HIP;  Surgeon: Kerrin ChampagneJames E Nitka, MD;  Location: [facility];  Service: Orthopedics;  Laterality: Right;    reports that she has never smoked. She has never used smokeless tobacco. She reports that she does not drink alcohol or use illicit drugs. family history includes Aneurysm (age of onset: 2562) in her brother; Cancer (age of onset: 6783) in her sister; Hypertension in her father; Pneumonia (age of onset: 2386) in her mother. Allergies  Allergen Reactions  . Penicillins     REACTION: swelling    especially involving right knee. Patient    Review of Systems  Constitutional: Negative for fatigue.  Eyes: Negative for visual disturbance.  Respiratory: Negative for cough, chest tightness,  shortness of breath and wheezing.   Cardiovascular: Negative for chest pain, palpitations and leg swelling.  Musculoskeletal: Positive for arthralgias.  Neurological: Positive for dizziness. Negative for seizures, syncope, weakness, light-headedness and headaches.       Objective:   Physical Exam  Constitutional: She is oriented to person, place, and time. She appears well-developed and well-nourished.  Neck: Neck supple. No JVD present.  Cardiovascular: Normal rate and regular rhythm.   Pulmonary/Chest: Effort normal and breath sounds normal. No respiratory distress. She has no wheezes. She has no rales.  Musculoskeletal: She exhibits no edema.  Right knee significant crepitus with flexion and extension. No warmth or erythema. Medial joint line tenderness  Neurological: She is alert and oriented to person, place, and time.          Assessment & Plan:  #1 osteoarthritis right knee.   requesting steroid injection. She has tolerated these well the past with good benefit.. We discussed risk and benefits of corticosteroid injection right knee. Patient consented. Right knee prepped with Betadine. Using one and 1/2 inch 25-gauge needle injected 40 mg Depo-Medrol and 2 mL of plain Xylocaine using medial approach. Patient tolerated well. No complications #2 hypertension well controlled. Continue current medications #3 hyperlipidemia. Recent lipids revealed good control #4 chronic insomnia. Sleep hygiene discussed. Continue mirtazapine  # 5 transient vertigo. Nonfocal exam. Suspect benign vertigo. Discussed possible vestibular rehabilitation but at this point she wishes to observe

## 2014-09-05 ENCOUNTER — Other Ambulatory Visit: Payer: Self-pay | Admitting: Family Medicine

## 2014-09-18 ENCOUNTER — Encounter: Payer: Self-pay | Admitting: Family Medicine

## 2014-09-18 ENCOUNTER — Ambulatory Visit (INDEPENDENT_AMBULATORY_CARE_PROVIDER_SITE_OTHER): Payer: Medicare Other | Admitting: Family Medicine

## 2014-09-18 VITALS — BP 128/70 | HR 64 | Temp 97.4°F | Wt 138.0 lb

## 2014-09-18 DIAGNOSIS — S80212A Abrasion, left knee, initial encounter: Secondary | ICD-10-CM

## 2014-09-18 NOTE — Progress Notes (Signed)
Pre visit review using our clinic review tool, if applicable. No additional management support is needed unless otherwise documented below in the visit note. 

## 2014-09-18 NOTE — Progress Notes (Signed)
   Subjective:    Patient ID: [name] [name], female    DOB: Jan 03, 1928, 81 y.o.   MRN: [id number]  HPI  Patient is seen with several small pinpoint type abrasions left anterior knee. Just notices morning. A couple more bleeding. She's on Plavix chronically. She denies any fall or injury. No pain. She's not had any other diffuse bruising or bleeding issues. They clean this with soap and water but she had some bleeding for a couple these which prompted them coming in.  Past Medical History  Diagnosis Date  . CAD (coronary artery disease)   . HTN (hypertension)   . Hyperlipidemia   . MI (myocardial infarction) 2002   Past Surgical History  Procedure Laterality Date  . Dilation and curettage of uterus  1960  . Hystrectomy    . Cataract extraction  1998    RIGHT AND LEFT EYE  . Cholecystectomy  2000  . Refractive surgery  2002  . Ptca  2002  . Abdominal hysterectomy    . Compression hip screw  12/16/2011    Procedure: COMPRESSION HIP;  Surgeon: Kerrin ChampagneJames E Nitka, MD;  Location: [facility];  Service: Orthopedics;  Laterality: Right;    reports that she has never smoked. She has never used smokeless tobacco. She reports that she does not drink alcohol or use illicit drugs. family history includes Aneurysm (age of onset: 6062) in her brother; Cancer (age of onset: 7583) in her sister; Hypertension in her father; Pneumonia (age of onset: 3586) in her mother. Allergies  Allergen Reactions  . Penicillins     REACTION: swelling     Review of Systems  Constitutional: Negative for fever and chills.       Objective:   Physical Exam  Constitutional: She appears well-developed and well-nourished.  Cardiovascular: Normal rate and regular rhythm.   Pulmonary/Chest: Effort normal and breath sounds normal. No respiratory distress. She has no wheezes. She has no rales.  Skin:  Left anterior knee reveals approximately 7 or 8 pinpoint type abrasions or puncture wounds. A couple these are minimally bleeding.  There is no bruising. No cellulitis changes. Nontender. Left knee reveals no effusion No foreign body noted          Assessment & Plan:  Patient has several pinpoint tight puncture type lesions/?abrasions left anterior knee. She does not give any history of trauma. No signs of secondary infection. We used silver nitrate to to the lesions that were bleeding slightly. This controlled her bleeding completely. Wound was clean with normal saline and topical antibiotic and dressing applied. Wound care instruction given.

## 2014-09-18 NOTE — Patient Instructions (Signed)
Keep clean with soap and water Topical antibiotic daily Follow up for any redness, increased swelling, or fever.

## 2015-01-03 ENCOUNTER — Other Ambulatory Visit: Payer: Self-pay | Admitting: Family Medicine

## 2015-01-15 ENCOUNTER — Other Ambulatory Visit: Payer: Self-pay | Admitting: Family Medicine

## 2015-02-03 ENCOUNTER — Ambulatory Visit (INDEPENDENT_AMBULATORY_CARE_PROVIDER_SITE_OTHER): Payer: Medicare Other | Admitting: Podiatry

## 2015-02-03 DIAGNOSIS — M79675 Pain in left toe(s): Secondary | ICD-10-CM

## 2015-02-03 DIAGNOSIS — M79674 Pain in right toe(s): Secondary | ICD-10-CM

## 2015-02-03 DIAGNOSIS — B351 Tinea unguium: Secondary | ICD-10-CM

## 2015-02-03 DIAGNOSIS — M79676 Pain in unspecified toe(s): Secondary | ICD-10-CM | POA: Diagnosis not present

## 2015-02-03 NOTE — Progress Notes (Signed)
Subjective:     Patient ID: Ilda MoriHelen Laiche, female   DOB: 1928-07-21, 81 y.o.   MRN: [id number]  HPIThis patient presents for her preventive foot care services. She has redeveloped callus under big toe joint right foot.     Review of Systems     Objective:   Physical Exam @BARCODE2D (Error - No data available.)@Objective : Review of past medical history, medications, social history and allergies were performed.  Vascular: Dorsalis pedis and posterior tibial pulses were palpable B/L, capillary refill was  WNL B/L, temperature gradient was WNL B/L   Skin:  Porokeratosis right foot sub 1st MPJ  Nails:Thick disfigured discolored nails hallux B/L  Sensory: Semmes Weinstein monifilament WNL   Orthopedic: Orthopedic evaluation demonstrates all joints distal t ankle have full ROM without crepitus, muscle power WNL B/L    Assessment:     Onychomycosis     Plan:     Debridement of Nails

## 2015-02-03 NOTE — Progress Notes (Signed)
Patient ID: Ilda MoriHelen Wolford, female   DOB: 07/14/1928, 81 y.o.   MRN: [id number] I need my toe nails trimmed

## 2015-02-18 ENCOUNTER — Ambulatory Visit (INDEPENDENT_AMBULATORY_CARE_PROVIDER_SITE_OTHER): Payer: Medicare Other | Admitting: Family Medicine

## 2015-02-18 ENCOUNTER — Encounter: Payer: Self-pay | Admitting: Family Medicine

## 2015-02-18 VITALS — BP 128/80 | HR 63 | Temp 97.8°F | Ht 63.0 in | Wt 137.5 lb

## 2015-02-18 DIAGNOSIS — I10 Essential (primary) hypertension: Secondary | ICD-10-CM | POA: Diagnosis not present

## 2015-02-18 DIAGNOSIS — E785 Hyperlipidemia, unspecified: Secondary | ICD-10-CM | POA: Diagnosis not present

## 2015-02-18 DIAGNOSIS — M1711 Unilateral primary osteoarthritis, right knee: Secondary | ICD-10-CM

## 2015-02-18 LAB — HEPATIC FUNCTION PANEL
ALBUMIN: 4 g/dL (ref 3.5–5.2)
ALT: 6 U/L (ref 0–35)
AST: 14 U/L (ref 0–37)
Alkaline Phosphatase: 73 U/L (ref 39–117)
Bilirubin, Direct: 0.1 mg/dL (ref 0.0–0.3)
Total Bilirubin: 0.6 mg/dL (ref 0.2–1.2)
Total Protein: 6.7 g/dL (ref 6.0–8.3)

## 2015-02-18 LAB — BASIC METABOLIC PANEL
BUN: 12 mg/dL (ref 6–23)
CHLORIDE: 98 meq/L (ref 96–112)
CO2: 30 mEq/L (ref 19–32)
Calcium: 10.2 mg/dL (ref 8.4–10.5)
Creatinine, Ser: 0.89 mg/dL (ref 0.40–1.20)
GFR: 64.05 mL/min (ref >60.00)
GLUCOSE: 95 mg/dL (ref 70–99)
Potassium: 3.9 mEq/L (ref 3.5–5.1)
Sodium: 133 mEq/L — ABNORMAL LOW (ref 135–145)

## 2015-02-18 LAB — LIPID PANEL
Cholesterol: 133 mg/dL (ref 0–200)
HDL: 53.3 mg/dL (ref >39.00)
LDL Cholesterol: 63 mg/dL (ref 0–99)
NonHDL: 79.7
Total CHOL/HDL Ratio: 2
Triglycerides: 84 mg/dL (ref 0.0–149.0)
VLDL: 16.8 mg/dL (ref 0.0–40.0)

## 2015-02-18 MED ORDER — METHYLPREDNISOLONE ACETATE 80 MG/ML IJ SUSP
80.0000 mg | Freq: Once | INTRAMUSCULAR | Status: AC
  Administered 2015-02-18: 80 mg via INTRA_ARTICULAR

## 2015-02-18 NOTE — Progress Notes (Signed)
Pre visit review using our clinic review tool, if applicable. No additional management support is needed unless otherwise documented below in the visit note. 

## 2015-02-18 NOTE — Progress Notes (Signed)
   Subjective:    Patient ID: [name] [name], female    DOB: 1927/08/11, 81 y.o.   MRN: [id number]  HPI Here for follow-up multiple medical problems. She has history of CAD, hyperlipidemia, hypertension, osteoarthritis. She is requesting repeat steroid injection right knee. Her right knee is worse than left. She has obtained significant benefit in the past with injections. Her medications are reviewed and compliant with all. Denies any side effects. No recent chest pains. No dizziness. No recent falls.  On lipitor and no recent myalgias     She continues to work with physical therapy several times per week.   Does not ambulate much.    Past Medical History  Diagnosis Date  . CAD (coronary artery disease)   . HTN (hypertension)   . Hyperlipidemia   . MI (myocardial infarction) 2002   Past Surgical History  Procedure Laterality Date  . Dilation and curettage of uterus  1960  . Hystrectomy    . Cataract extraction  1998    RIGHT AND LEFT EYE  . Cholecystectomy  2000  . Refractive surgery  2002  . Ptca  2002  . Abdominal hysterectomy    . Compression hip screw  12/16/2011    Procedure: COMPRESSION HIP;  Surgeon: Kerrin ChampagneJames E Nitka, MD;  Location: [facility];  Service: Orthopedics;  Laterality: Right;    reports that she has never smoked. She has never used smokeless tobacco. She reports that she does not drink alcohol or use illicit drugs. family history includes Aneurysm (age of onset: 2662) in her brother; Cancer (age of onset: 6583) in her sister; Hypertension in her father; Pneumonia (age of onset: 7986) in her mother. Allergies  Allergen Reactions  . Penicillins     REACTION: swelling      Review of Systems  Constitutional: Negative for fatigue.  Eyes: Negative for visual disturbance.  Respiratory: Negative for cough, chest tightness, shortness of breath and wheezing.   Cardiovascular: Negative for chest pain, palpitations and leg swelling.  Musculoskeletal: Positive for arthralgias.    Neurological: Negative for dizziness, seizures, syncope, weakness, light-headedness and headaches.       Objective:   Physical Exam  Constitutional: She appears well-developed and well-nourished.  Neck: Neck supple. No JVD present. No thyromegaly present.  Cardiovascular: Normal rate and regular rhythm.   Pulmonary/Chest: Effort normal and breath sounds normal. No respiratory distress. She has no wheezes. She has no rales.  Musculoskeletal: She exhibits no edema.  He has crepitus with flexion-extension her right knee. No effusion. No warmth. No erythema. Medial joint line tenderness          Assessment & Plan:  #1 hypertension. Stable and at goal. Continue current medications. Check basic metabolic panel #2 hyperlipidemia. Continue Lipitor. Check lipid and hepatic panel  #3 primary osteoarthritis right knee. Patient requesting steroid injection and she has received several months of benefit in the past. We discussed risk and benefits. Knee prepped with Betadine. Using sterile technique injected 1 mL of Depo-Medrol 2 mL plain Xylocaine using 25-gauge one and 1/2 inch needle using medial and inferior approach right knee.  Pt tolerated well.

## 2015-02-18 NOTE — Patient Instructions (Signed)
Knee Injection Joint injections are shots. Your caregiver will place a needle into your knee joint. The needle is used to put medicine into the joint. These shots can be used to help treat different painful knee conditions such as osteoarthritis, bursitis, local flare-ups of rheumatoid arthritis, and pseudogout. Anti-inflammatory medicines such as corticosteroids and anesthetics are the most common medicines used for joint and soft tissue injections.  PROCEDURE  The skin over the kneecap will be cleaned with an antiseptic solution.  Your caregiver will inject a small amount of a local anesthetic (a medicine like Novocaine) just under the skin in the area that was cleaned.  After the area becomes numb, a second injection is done. This second injection usually includes an anesthetic and an anti-inflammatory medicine called a steroid or cortisone. The needle is carefully placed in between the kneecap and the knee, and the medicine is injected into the joint space.  After the injection is done, the needle is removed. Your caregiver may place a bandage over the injection site. The whole procedure takes no more than a couple of minutes. BEFORE THE PROCEDURE  Wash all of the skin around the entire knee area. Try to remove any loose, scaling skin. There is no other specific preparation necessary unless advised otherwise by your caregiver. LET YOUR CAREGIVER KNOW ABOUT:   Allergies.  Medications taken including herbs, eye drops, over the counter medications, and creams.  Use of steroids (by mouth or creams).  Possible pregnancy, if applicable.  Previous problems with anesthetics or Novocaine.  History of blood clots (thrombophlebitis).  History of bleeding or blood problems.  Previous surgery.  Other health problems. RISKS AND COMPLICATIONS Side effects from cortisone shots are rare. They include:   Slight bruising of the skin.  Shrinkage of the normal fatty tissue under the skin where  the shot was given.  Increase in pain after the shot.  Infection.  Weakening of tendons or tendon rupture.  Allergic reaction to the medicine.  Diabetics may have a temporary increase in their blood sugar after a shot.  Cortisone can temporarily weaken the immune system. While receiving these shots, you should not get certain vaccines. Also, avoid contact with anyone who has chickenpox or measles. Especially if you have never had these diseases or have not been previously immunized. Your immune system may not be strong enough to fight off the infection while the cortisone is in your system. AFTER THE PROCEDURE   You can go home after the procedure.  You may need to put ice on the joint 15-20 minutes every 3 or 4 hours until the pain goes away.  You may need to put an elastic bandage on the joint. HOME CARE INSTRUCTIONS   Only take over-the-counter or prescription medicines for pain, discomfort, or fever as directed by your caregiver.  You should avoid stressing the joint. Unless advised otherwise, avoid activities that put a lot of pressure on a knee joint, such as:  Jogging.  Bicycling.  Recreational climbing.  Hiking.  Laying down and elevating the leg/knee above the level of your heart can help to minimize swelling. SEEK MEDICAL CARE IF:   You have repeated or worsening swelling.  There is drainage from the puncture area.  You develop red streaking that extends above or below the site where the needle was inserted. SEEK IMMEDIATE MEDICAL CARE IF:   You develop a fever.  You have pain that gets worse even though you are taking pain medicine.  The area is   red and warm, and you have trouble moving the joint. MAKE SURE YOU:   Understand these instructions.  Will watch your condition.  Will get help right away if you are not doing well or get worse. Document Released: 10/15/2006 Document Revised: 10/16/2011 Document Reviewed: 07/12/2007 [employer] Patient  Information 2015 [employer]. This information is not intended to replace advice given to you by your health care provider. Make sure you discuss any questions you have with your health care provider.  

## 2015-02-24 ENCOUNTER — Other Ambulatory Visit: Payer: Self-pay | Admitting: Family Medicine

## 2015-03-28 ENCOUNTER — Other Ambulatory Visit: Payer: Self-pay | Admitting: Family Medicine

## 2015-04-14 ENCOUNTER — Other Ambulatory Visit: Payer: Self-pay | Admitting: Family Medicine

## 2015-04-28 ENCOUNTER — Ambulatory Visit: Payer: Medicare Other | Admitting: Podiatry

## 2015-04-30 ENCOUNTER — Ambulatory Visit (INDEPENDENT_AMBULATORY_CARE_PROVIDER_SITE_OTHER): Payer: Medicare Other | Admitting: Podiatry

## 2015-04-30 ENCOUNTER — Encounter: Payer: Self-pay | Admitting: Podiatry

## 2015-04-30 DIAGNOSIS — M79674 Pain in right toe(s): Secondary | ICD-10-CM

## 2015-04-30 DIAGNOSIS — M79676 Pain in unspecified toe(s): Secondary | ICD-10-CM | POA: Diagnosis not present

## 2015-04-30 DIAGNOSIS — B351 Tinea unguium: Secondary | ICD-10-CM | POA: Diagnosis not present

## 2015-04-30 DIAGNOSIS — M79675 Pain in left toe(s): Secondary | ICD-10-CM | POA: Diagnosis not present

## 2015-04-30 NOTE — Progress Notes (Signed)
Patient ID: [name] [name], female   DOB: 1927-10-16, 81 y.o.   MRN: [id number] Complaint:  Visit Type: Patient returns to my office for continued preventative foot care services. Complaint: Patient states" my nails have grown long and thick and become painful to walk and wear shoes" . The patient presents for preventative foot care services. No changes to ROS  Podiatric Exam: Vascular: dorsalis pedis and posterior tibial pulses are palpable bilateral. Capillary return is immediate. Temperature gradient is WNL. Skin turgor WNL  Sensorium: Normal Semmes Weinstein monofilament test. Normal tactile sensation bilaterally. Nail Exam: Pt has thick disfigured discolored nails with subungual debris noted bilateral entire nail hallux  Ulcer Exam: There is no evidence of ulcer or pre-ulcerative changes or infection. Orthopedic Exam: Muscle tone and strength are WNL. No limitations in general ROM. No crepitus or effusions noted. Foot type and digits show no abnormalities. Bony prominences are unremarkable. Tophus second toe right foot. Skin: No Porokeratosis. No infection or ulcers  Diagnosis:  Onychomycosis, , Pain in right toe, pain in left toes  Treatment & Plan Procedures and Treatment: Consent by patient was obtained for treatment procedures. The patient understood the discussion of treatment and procedures well. All questions were answered thoroughly reviewed. Debridement of mycotic and hypertrophic toenails, 1 through 5 bilateral and clearing of subungual debris. No ulceration, no infection noted.  Return Visit-Office Procedure: Patient instructed to return to the office for a follow up visit 3 months for continued evaluation and treatment.

## 2015-05-23 ENCOUNTER — Other Ambulatory Visit: Payer: Self-pay | Admitting: Family Medicine

## 2015-06-08 ENCOUNTER — Ambulatory Visit (INDEPENDENT_AMBULATORY_CARE_PROVIDER_SITE_OTHER): Payer: Medicare Other

## 2015-06-08 DIAGNOSIS — Z23 Encounter for immunization: Secondary | ICD-10-CM

## 2015-07-03 ENCOUNTER — Other Ambulatory Visit: Payer: Self-pay | Admitting: Family Medicine

## 2015-07-22 ENCOUNTER — Ambulatory Visit: Payer: Medicare Other | Admitting: Podiatry

## 2015-08-19 ENCOUNTER — Encounter: Payer: Self-pay | Admitting: Family Medicine

## 2015-08-19 ENCOUNTER — Ambulatory Visit (INDEPENDENT_AMBULATORY_CARE_PROVIDER_SITE_OTHER): Payer: Medicare Other | Admitting: Family Medicine

## 2015-08-19 ENCOUNTER — Ambulatory Visit: Payer: Medicare Other | Admitting: Podiatry

## 2015-08-19 VITALS — BP 116/80 | HR 55 | Temp 97.8°F | Ht 63.0 in | Wt 137.0 lb

## 2015-08-19 DIAGNOSIS — E785 Hyperlipidemia, unspecified: Secondary | ICD-10-CM | POA: Diagnosis not present

## 2015-08-19 DIAGNOSIS — I2510 Atherosclerotic heart disease of native coronary artery without angina pectoris: Secondary | ICD-10-CM

## 2015-08-19 DIAGNOSIS — I10 Essential (primary) hypertension: Secondary | ICD-10-CM

## 2015-08-19 DIAGNOSIS — Z23 Encounter for immunization: Secondary | ICD-10-CM | POA: Diagnosis not present

## 2015-08-19 DIAGNOSIS — M1711 Unilateral primary osteoarthritis, right knee: Secondary | ICD-10-CM

## 2015-08-19 MED ORDER — METHYLPREDNISOLONE ACETATE 80 MG/ML IJ SUSP
80.0000 mg | Freq: Once | INTRAMUSCULAR | Status: AC
  Administered 2015-08-19: 80 mg via INTRA_ARTICULAR

## 2015-08-19 NOTE — Progress Notes (Signed)
Pre visit review using our clinic review tool, if applicable. No additional management support is needed unless otherwise documented below in the visit note. 

## 2015-08-19 NOTE — Progress Notes (Signed)
Subjective:    Patient ID: [name] [name], female    DOB: Oct 19, 1927, 82 y.o.   MRN: [id number]  HPI Patient here for medical follow-up  Chronic problems include history of CAD, hypertension, hyperlipidemia, osteoarthritis, gout. Denies any recent gout issues. No recent chest pains. Medications reviewed. Compliant with all.  She had lipids and basic chemistries last summer which were stable.  Ambulates with a walker at home. Becoming more and more restricted with ambulation. She is currently getting physical therapy 3 times a week. Recurrent osteoarthritis especially of the right knee. She has seen Great Benefit from Steroid Injection the past. Most Recent Injection July 2016. She Had Several Months of Improvement but by December Had Some Recurrent Pains and Requesting Repeat Injection.  Denies any recent falls. Had flu vaccine already. No history of Prevnar 13.  Past Medical History  Diagnosis Date  . CAD (coronary artery disease)   . HTN (hypertension)   . Hyperlipidemia   . MI (myocardial infarction) (HCC) 2002   Past Surgical History  Procedure Laterality Date  . Dilation and curettage of uterus  1960  . Hystrectomy    . Cataract extraction  1998    RIGHT AND LEFT EYE  . Cholecystectomy  2000  . Refractive surgery  2002  . Ptca  2002  . Abdominal hysterectomy    . Compression hip screw  12/16/2011    Procedure: COMPRESSION HIP;  Surgeon: Kerrin ChampagneJames E Nitka, MD;  Location: [facility];  Service: Orthopedics;  Laterality: Right;    reports that she has never smoked. She has never used smokeless tobacco. She reports that she does not drink alcohol or use illicit drugs. family history includes Aneurysm (age of onset: 8162) in her brother; Cancer (age of onset: 6583) in her sister; Hypertension in her father; Pneumonia (age of onset: 2886) in her mother. Allergies  Allergen Reactions  . Penicillins     REACTION: swelling      Review of Systems  Constitutional: Negative for fatigue and  unexpected weight change.  Eyes: Negative for visual disturbance.  Respiratory: Negative for cough, chest tightness, shortness of breath and wheezing.   Cardiovascular: Negative for chest pain, palpitations and leg swelling.  Gastrointestinal: Negative for abdominal pain.  Endocrine: Negative for polydipsia and polyuria.  Musculoskeletal: Positive for arthralgias.  Neurological: Negative for dizziness, seizures, syncope, weakness, light-headedness and headaches.       Objective:   Physical Exam  Constitutional: She is oriented to person, place, and time. She appears well-developed and well-nourished.  Neck: Neck supple. No JVD present.  Cardiovascular: Normal rate and regular rhythm.   Pulmonary/Chest: Effort normal and breath sounds normal. No respiratory distress. She has no wheezes. She has no rales.  Musculoskeletal:  Knees reveal stiffness and crepitus with flexion-extension right greater than left. No effusion. No warmth. No erythema. No ecchymosis.  Neurological: She is alert and oriented to person, place, and time.          Assessment & Plan:  #1 hypertension stable.  Continue current medications  #2 hyperlipidemia. Continue Lipitor and recheck lipids at follow-up in 6 months   #3 history of CAD. No recent chest pains or worrisome symptoms.  #4 health maintenance. Prevnar 13 recommended and patient consents. Continue yearly flu vaccine  #5 primary osteoarthritis right knee. We discussed risk and benefits of corticosteroid injection and patient consents. We discussed risk of bruising, bleeding, infection. She has tolerated these well in the past. Right knee prepped with Betadine. Using 1-1/2 inch  25-gauge needle injected 1 mL of Depo-Medrol and 2 mL of plain Xylocaine using anterior/medial approach. Patient tolerated well. Continue regular exercises

## 2015-08-26 ENCOUNTER — Ambulatory Visit (INDEPENDENT_AMBULATORY_CARE_PROVIDER_SITE_OTHER): Payer: Medicare Other | Admitting: Podiatry

## 2015-08-26 ENCOUNTER — Encounter: Payer: Self-pay | Admitting: Podiatry

## 2015-08-26 DIAGNOSIS — M79675 Pain in left toe(s): Secondary | ICD-10-CM

## 2015-08-26 DIAGNOSIS — Q828 Other specified congenital malformations of skin: Secondary | ICD-10-CM

## 2015-08-26 DIAGNOSIS — M79676 Pain in unspecified toe(s): Secondary | ICD-10-CM

## 2015-08-26 DIAGNOSIS — B351 Tinea unguium: Secondary | ICD-10-CM | POA: Diagnosis not present

## 2015-08-26 NOTE — Progress Notes (Signed)
Patient ID: [name] [name], female   DOB: [birth year], 82 y.o.   MRN: [id number] Complaint:  Visit Type: Patient returns to my office for continued preventative foot care services. Complaint: Patient states" my nails have grown long and thick and become painful to walk and wear shoes" . The patient presents for preventative foot care services. No changes to ROS  Podiatric Exam: Vascular: dorsalis pedis and posterior tibial pulses are palpable bilateral. Capillary return is immediate. Temperature gradient is WNL. Skin turgor WNL  Sensorium: Normal Semmes Weinstein monofilament test. Normal tactile sensation bilaterally. Nail Exam: Pt has thick disfigured discolored nails with subungual debris noted bilateral entire nail hallux  Ulcer Exam: There is no evidence of ulcer or pre-ulcerative changes or infection. Orthopedic Exam: Muscle tone and strength are WNL. No limitations in general ROM. No crepitus or effusions noted. Foot type and digits show no abnormalities. Bony prominences are unremarkable. Tophus second toe right foot. Skin:  Porokeratosis sub 1 right foot No infection or ulcers  Diagnosis:  Onychomycosis, , Pain in right toe, pain in left toes,  Porokeratosis right foot  Treatment & Plan Procedures and Treatment: Consent by patient was obtained for treatment procedures. The patient understood the discussion of treatment and procedures well. All questions were answered thoroughly reviewed. Debridement of mycotic and hypertrophic toenails, 1 through 5 bilateral and clearing of subungual debris. No ulceration, no infection noted.Debride porokeratosis  Return Visit-Office Procedure: Patient instructed to return to the office for a follow up visit 3 months for continued evaluation and treatment.   Helane Gunther DPM

## 2015-08-31 ENCOUNTER — Ambulatory Visit (INDEPENDENT_AMBULATORY_CARE_PROVIDER_SITE_OTHER): Payer: Medicare Other | Admitting: Family Medicine

## 2015-08-31 ENCOUNTER — Encounter: Payer: Self-pay | Admitting: Family Medicine

## 2015-08-31 ENCOUNTER — Telehealth: Payer: Self-pay | Admitting: Family Medicine

## 2015-08-31 VITALS — BP 140/72 | HR 64 | Temp 97.5°F | Ht 63.0 in

## 2015-08-31 DIAGNOSIS — J069 Acute upper respiratory infection, unspecified: Secondary | ICD-10-CM

## 2015-08-31 MED ORDER — BENZONATATE 100 MG PO CAPS: 100.0000 mg | ORAL_CAPSULE | Freq: Three times a day (TID) | ORAL | Status: DC | PRN

## 2015-08-31 NOTE — Patient Instructions (Signed)
INSTRUCTIONS FOR UPPER RESPIRATORY INFECTION:  -plenty of rest and fluids  -nasal saline wash 2-3 times daily (use prepackaged nasal saline or bottled/distilled water if making your own)   -can use AFRIN nasal spray for drainage and nasal congestion - but do NOT use longer then 3-4 days  -can use tylenol (in no history of liver disease) as directed for aches and sorethroat  -in the winter time, using a humidifier at night is helpful (please follow cleaning instructions)  -if you are taking a cough medication - use only as directed, may also try a teaspoon of honey to coat the throat and throat lozenges. If given a cough medication with codeine or hydrocodone or other narcotic please be advised that this contains a strong and  potentially addicting medication. Please follow instructions carefully, take as little as possible and only use AS NEEDED for severe cough. Discuss potential side effects with your pharmacy. Please do not drive or operate machinery while taking these types of medications. Please do not take other sedating medications, drugs or alcohol while taking this medication without discussing with your doctor.  -for sore throat, salt water gargles can help  -follow up if you have fevers, facial pain, tooth pain, difficulty breathing or are worsening or symptoms persist longer then expected  Upper Respiratory Infection, Adult An upper respiratory infection (URI) is also known as the common cold. It is often caused by a type of germ (virus). Colds are easily spread (contagious). You can pass it to others by kissing, coughing, sneezing, or drinking out of the same glass. Usually, you get better in 1 to 2 weeks.  However, the cough can last for even longer. HOME CARE   Only take medicine as told by your doctor. Follow instructions provided above.  Drink enough water and fluids to keep your pee (urine) clear or pale yellow.  Get plenty of rest.  Return to work when your temperature  is < 100 for 24 hours or as told by your doctor. You may use a face mask and wash your hands to stop your cold from spreading. GET HELP RIGHT AWAY IF:   After the first few days, you feel you are getting worse.  You have questions about your medicine.  You have chills, shortness of breath, or red spit (mucus).  You have pain in the face for more then 1-2 days, especially when you bend forward.  You have a fever, puffy (swollen) neck, pain when you swallow, or white spots in the back of your throat.  You have a bad headache, ear pain, sinus pain, or chest pain.  You have a high-pitched whistling sound when you breathe in and out (wheezing).  You cough up blood.  You have sore muscles or a stiff neck. MAKE SURE YOU:   Understand these instructions.  Will watch your condition.  Will get help right away if you are not doing well or get worse. Document Released: 01/10/2008 Document Revised: 10/16/2011 Document Reviewed: 10/29/2013 [facility] Patient Information 2015 [place], [place]. This information is not intended to replace advice given to you by your health care provider. Make sure you discuss any questions you have with your health care provider.

## 2015-08-31 NOTE — Telephone Encounter (Signed)
Rx sent to [facility]

## 2015-08-31 NOTE — Progress Notes (Signed)
Pre visit review using our clinic review tool, if applicable. No additional management support is needed unless otherwise documented below in the visit note. 

## 2015-08-31 NOTE — Telephone Encounter (Signed)
Pt saw dr Selena Batten this am Pharm [facility] called because they have not received rx benzonatate (TESSALON) 100 MG capsule  Can you resend?  [facility]

## 2015-08-31 NOTE — Progress Notes (Signed)
HPI:  [name] [name] is a very pleasant 82 year old here for an acute visit for  Sinus congestion -started: 4 days ago -symptoms:nasal congestion, sore throat, PND, cough, body aches -denies:fever, SOB, NVD, tooth pain, sinus pain -has tried: OTC cold medication with tylenol -sick contacts/travel/risks: denies flu exposure  ROS: See pertinent positives and negatives per HPI.  Past Medical History  Diagnosis Date  . CAD (coronary artery disease)   . HTN (hypertension)   . Hyperlipidemia   . MI (myocardial infarction) (HCC) 2002    Past Surgical History  Procedure Laterality Date  . Dilation and curettage of uterus  1960  . Hystrectomy    . Cataract extraction  1998    RIGHT AND LEFT EYE  . Cholecystectomy  2000  . Refractive surgery  2002  . Ptca  2002  . Abdominal hysterectomy    . Compression hip screw  12/16/2011    Procedure: COMPRESSION HIP;  Surgeon: Kerrin Champagne, MD;  Location: [facility];  Service: Orthopedics;  Laterality: Right;    Family History  Problem Relation Age of Onset  . Hypertension Father     DECEASED  . Cancer Sister 80    BREAST/BONE CA  . Pneumonia Mother 34  . Aneurysm Brother 66    DECEASED    Social History   Social History  . Marital Status: Single    Spouse Name: N/A  . Number of Children: N/A  . Years of Education: N/A   Social History Main Topics  . Smoking status: Never Smoker   . Smokeless tobacco: Never Used  . Alcohol Use: No  . Drug Use: No  . Sexual Activity: Not Asked   Other Topics Concern  . None   Social History Narrative     Current outpatient prescriptions:  .  aspirin 81 MG tablet, Take 81 mg by mouth daily.  , Disp: , Rfl:  .  atorvastatin (LIPITOR) 20 MG tablet, TAKE 1 TABLET DAILY, Disp: 90 tablet, Rfl: 1 .  clopidogrel (PLAVIX) 75 MG tablet, TAKE 1 TABLET DAILY, Disp: 90 tablet, Rfl: 1 .  gabapentin (NEURONTIN) 100 MG capsule, TAKE 1 CAPSULE TWICE A DAY, Disp: 180 capsule, Rfl: 2 .   hydrochlorothiazide (HYDRODIURIL) 25 MG tablet, TAKE 1 TABLET DAILY, Disp: 90 tablet, Rfl: 1 .  lisinopril (PRINIVIL,ZESTRIL) 20 MG tablet, TAKE 1 TABLET DAILY, Disp: 90 tablet, Rfl: 1 .  metoprolol succinate (TOPROL-XL) 50 MG 24 hr tablet, TAKE 1 TABLET DAILY, Disp: 90 tablet, Rfl: 2 .  mirtazapine (REMERON SOL-TAB) 15 MG disintegrating tablet, PLACE 1 TABLET ON TONGUE AT BEDTIME, Disp: 90 tablet, Rfl: 1 .  potassium chloride SA (K-DUR,KLOR-CON) 20 MEQ tablet, TAKE 1 TABLET DAILY, Disp: 90 tablet, Rfl: 3 .  benzonatate (TESSALON) 100 MG capsule, Take 1 capsule (100 mg total) by mouth 3 (three) times daily as needed for cough., Disp: 20 capsule, Rfl: 0  EXAM:  Filed Vitals:   08/31/15 1109  BP: 140/72  Pulse: 64  Temp: 97.5 F (36.4 C)    There is no weight on file to calculate BMI.  GENERAL: vitals reviewed and listed above, alert, oriented, appears well hydrated and in no acute distress  HEENT: atraumatic, conjunttiva clear, no obvious abnormalities on inspection of external nose and ears, normal appearance of ear canals and TMs, clear nasal congestion, mild post oropharyngeal erythema with PND, no tonsillar edema or exudate, no sinus TTP  NECK: no obvious masses on inspection  LUNGS: clear to auscultation bilaterally,  no wheezes, rales or rhonchi, good air movement  CV: HRRR, no peripheral edema  MS: moves all extremities without noticeable abnormality  PSYCH: pleasant and cooperative, no obvious depression or anxiety  ASSESSMENT AND PLAN:  Discussed the following assessment and plan:  Common cold - Acute upper respiratory infection   -given HPI and exam findings today, a serious infection or illness is unlikely. We discussed potential etiologies, with VURI being most likely, and advised supportive care and monitoring. We discussed treatment side effects, likely course, antibiotic misuse, transmission, and signs of developing a serious illness. -of course, we advised to  return or notify a doctor immediately if symptoms worsen or persist or new concerns arise.    Patient Instructions  INSTRUCTIONS FOR UPPER RESPIRATORY INFECTION:  -plenty of rest and fluids  -nasal saline wash 2-3 times daily (use prepackaged nasal saline or bottled/distilled water if making your own)   -can use AFRIN nasal spray for drainage and nasal congestion - but do NOT use longer then 3-4 days  -can use tylenol (in no history of liver disease) as directed for aches and sorethroat  -in the winter time, using a humidifier at night is helpful (please follow cleaning instructions)  -if you are taking a cough medication - use only as directed, may also try a teaspoon of honey to coat the throat and throat lozenges. If given a cough medication with codeine or hydrocodone or other narcotic please be advised that this contains a strong and  potentially addicting medication. Please follow instructions carefully, take as little as possible and only use AS NEEDED for severe cough. Discuss potential side effects with your pharmacy. Please do not drive or operate machinery while taking these types of medications. Please do not take other sedating medications, drugs or alcohol while taking this medication without discussing with your doctor.  -for sore throat, salt water gargles can help  -follow up if you have fevers, facial pain, tooth pain, difficulty breathing or are worsening or symptoms persist longer then expected  Upper Respiratory Infection, Adult An upper respiratory infection (URI) is also known as the common cold. It is often caused by a type of germ (virus). Colds are easily spread (contagious). You can pass it to others by kissing, coughing, sneezing, or drinking out of the same glass. Usually, you get better in 1 to 2 weeks.  However, the cough can last for even longer. HOME CARE   Only take medicine as told by your doctor. Follow instructions provided above.  Drink enough water  and fluids to keep your pee (urine) clear or pale yellow.  Get plenty of rest.  Return to work when your temperature is < 100 for 24 hours or as told by your doctor. You may use a face mask and wash your hands to stop your cold from spreading. GET HELP RIGHT AWAY IF:   After the first few days, you feel you are getting worse.  You have questions about your medicine.  You have chills, shortness of breath, or red spit (mucus).  You have pain in the face for more then 1-2 days, especially when you bend forward.  You have a fever, puffy (swollen) neck, pain when you swallow, or white spots in the back of your throat.  You have a bad headache, ear pain, sinus pain, or chest pain.  You have a high-pitched whistling sound when you breathe in and out (wheezing).  You cough up blood.  You have sore muscles or a stiff neck.  MAKE SURE YOU:   Understand these instructions.  Will watch your condition.  Will get help right away if you are not doing well or get worse. Document Released: 01/10/2008 Document Revised: 10/16/2011 Document Reviewed: 10/29/2013 [facility] Patient Information 2015 [place], [place]. This information is not intended to replace advice given to you by your health care provider. Make sure you discuss any questions you have with your health care provider.      Kriste Basque R.

## 2015-09-08 HISTORY — PX: TRANSTHORACIC ECHOCARDIOGRAM: SHX275

## 2015-09-20 ENCOUNTER — Ambulatory Visit (INDEPENDENT_AMBULATORY_CARE_PROVIDER_SITE_OTHER): Payer: Medicare Other | Admitting: Family Medicine

## 2015-09-20 VITALS — BP 170/98 | HR 57 | Temp 97.6°F

## 2015-09-20 DIAGNOSIS — I10 Essential (primary) hypertension: Secondary | ICD-10-CM | POA: Diagnosis not present

## 2015-09-20 DIAGNOSIS — M545 Low back pain, unspecified: Secondary | ICD-10-CM

## 2015-09-20 NOTE — Patient Instructions (Signed)
Take 2 Gabapentin in morning and take two at night for pain. If no improvement, you may increase dose to 3 Gabapentin twice daily. . Sciatica Sciatica is pain, weakness, numbness, or tingling along your sciatic nerve. The nerve starts in the lower back and runs down the back of each leg. Nerve damage or certain conditions pinch or put pressure on the sciatic nerve. This causes the pain, weakness, and other discomforts of sciatica. HOME CARE   Only take medicine as told by your doctor.  Apply ice to the affected area for 20 minutes. Do this 3-4 times a day for the first 48-72 hours. Then try heat in the same way.  Exercise, stretch, or do your usual activities if these do not make your pain worse.  Go to physical therapy as told by your doctor.  Keep all doctor visits as told.  Do not wear high heels or shoes that are not supportive.  Get a firm mattress if your mattress is too soft to lessen pain and discomfort. GET HELP RIGHT AWAY IF:   You cannot control when you poop (bowel movement) or pee (urinate).  You have more weakness in your lower back, lower belly (pelvis), butt (buttocks), or legs.  You have redness or puffiness (swelling) of your back.  You have a burning feeling when you pee.  You have pain that gets worse when you lie down.  You have pain that wakes you from your sleep.  Your pain is worse than past pain.  Your pain lasts longer than 4 weeks.  You are suddenly losing weight without reason. MAKE SURE YOU:   Understand these instructions.  Will watch this condition.  Will get help right away if you are not doing well or get worse.   This information is not intended to replace advice given to you by your health care provider. Make sure you discuss any questions you have with your health care provider.   Document Released: 05/02/2008 Document Revised: 04/14/2015 Document Reviewed: 12/03/2011 Elsevier Interactive Patient Education [employer]

## 2015-09-20 NOTE — Progress Notes (Signed)
   Subjective:    Patient ID: [name] [name], female    DOB: 05-09-1928, 82 y.o.   MRN: [id number]  HPI  Acute Visit Right lumbar back Pain  Patient seen today with a complaint of right low back pain times 5 days. Past medical history: hypertension, osteoarthritis, hip replacement,and hyperlipidemia.   Patient reports experiencing a sharp pain in her right buttocks which radiate right side of sacrum and mid-level of right thigh.   She has taken 650 mg acetaminophen with minimal relief. Rates pain 10/10 and sharp.  Pain is dull/achy with sitting.  Denies fall or injury.  Worse with movement.    Hypertension Patient reports stable blood pressure when she checks her pressures at home.  She states her blood pressure is elevated today related to 10/10 pain.  Review of Systems  Constitutional: Positive for appetite change.       Reports a decrease in appetite due to pain.  HENT: Negative.   Eyes: Negative.   Respiratory: Negative.   Endocrine: Negative.   Genitourinary: Negative.   Musculoskeletal: Positive for myalgias and back pain.       See HPI  Skin: Negative.   Allergic/Immunologic: Negative.   Neurological: Negative.   Hematological: Negative.   Psychiatric/Behavioral: Negative.        Objective:   Physical Exam  Constitutional: She is oriented to person, place, and time. She appears well-developed and well-nourished.  HENT:  Head: Normocephalic.  Eyes: Conjunctivae and EOM are normal. Pupils are equal, round, and reactive to light.  Neck: Normal range of motion. Neck supple.  Cardiovascular: Normal rate, regular rhythm, normal heart sounds and intact distal pulses.   Musculoskeletal: She exhibits tenderness.  Right lower gluteal region: Pain with deep palpation. Leg strength equal bilaterally.   Neurological: She is alert and oriented to person, place, and time. She displays abnormal reflex.  Right patellar reflex diminished. Left patellar reflex present. Right and  Left dorsiflex and plantar flexion present.  Skin: Skin is dry.  Psychiatric: She has a normal mood and affect. Her behavior is normal. Judgment and thought content normal.          Assessment & Plan:  1. Right low back pain: Patient describes sharp pain that radiates to sacrum and mid-level of her right thigh that is not minimized or relieved with Tylenol suggests neuropathic pain. Patient has a history of sciatica.  No focal strength deficits.  Plan: Increase Gabapentin to 200 mg twice daily. If no relief, patient may increase dose to 300 mg twice daily-after a few days.  If pain worsens or is not relieve with medication, patient will contact me for possible consideration for MRI.  2. Hypertension:  Patient's blood pressure historically have been stable.  She checks blood pressure regularly at home and readings are less than 130 systolic.  Plan: Continue on current antihypertensive medications.

## 2015-09-22 ENCOUNTER — Observation Stay (HOSPITAL_COMMUNITY)
Admission: EM | Admit: 2015-09-22 | Discharge: 2015-09-24 | Disposition: A | Discharge status: Skilled Nursing Facility | Payer: Medicare Other | Attending: Internal Medicine | Admitting: Internal Medicine

## 2015-09-22 ENCOUNTER — Inpatient Hospital Stay (HOSPITAL_COMMUNITY): Payer: Medicare Other

## 2015-09-22 ENCOUNTER — Emergency Department (HOSPITAL_COMMUNITY): Payer: Medicare Other

## 2015-09-22 ENCOUNTER — Encounter (HOSPITAL_COMMUNITY): Payer: Self-pay | Admitting: Emergency Medicine

## 2015-09-22 DIAGNOSIS — M545 Low back pain, unspecified: Secondary | ICD-10-CM

## 2015-09-22 DIAGNOSIS — I272 Other secondary pulmonary hypertension: Secondary | ICD-10-CM | POA: Insufficient documentation

## 2015-09-22 DIAGNOSIS — Z7902 Long term (current) use of antithrombotics/antiplatelets: Secondary | ICD-10-CM | POA: Diagnosis not present

## 2015-09-22 DIAGNOSIS — W19XXXA Unspecified fall, initial encounter: Secondary | ICD-10-CM

## 2015-09-22 DIAGNOSIS — Z9861 Coronary angioplasty status: Secondary | ICD-10-CM

## 2015-09-22 DIAGNOSIS — M1611 Unilateral primary osteoarthritis, right hip: Secondary | ICD-10-CM | POA: Diagnosis not present

## 2015-09-22 DIAGNOSIS — R946 Abnormal results of thyroid function studies: Secondary | ICD-10-CM | POA: Insufficient documentation

## 2015-09-22 DIAGNOSIS — I252 Old myocardial infarction: Secondary | ICD-10-CM | POA: Insufficient documentation

## 2015-09-22 DIAGNOSIS — M879 Osteonecrosis, unspecified: Secondary | ICD-10-CM | POA: Diagnosis not present

## 2015-09-22 DIAGNOSIS — Z79899 Other long term (current) drug therapy: Secondary | ICD-10-CM | POA: Diagnosis not present

## 2015-09-22 DIAGNOSIS — M1711 Unilateral primary osteoarthritis, right knee: Secondary | ICD-10-CM | POA: Insufficient documentation

## 2015-09-22 DIAGNOSIS — I10 Essential (primary) hypertension: Secondary | ICD-10-CM | POA: Diagnosis not present

## 2015-09-22 DIAGNOSIS — E876 Hypokalemia: Secondary | ICD-10-CM

## 2015-09-22 DIAGNOSIS — M8700 Idiopathic aseptic necrosis of unspecified bone: Secondary | ICD-10-CM

## 2015-09-22 DIAGNOSIS — G629 Polyneuropathy, unspecified: Secondary | ICD-10-CM | POA: Diagnosis not present

## 2015-09-22 DIAGNOSIS — E785 Hyperlipidemia, unspecified: Secondary | ICD-10-CM | POA: Diagnosis present

## 2015-09-22 DIAGNOSIS — I2510 Atherosclerotic heart disease of native coronary artery without angina pectoris: Secondary | ICD-10-CM | POA: Diagnosis not present

## 2015-09-22 DIAGNOSIS — W06XXXA Fall from bed, initial encounter: Secondary | ICD-10-CM | POA: Diagnosis not present

## 2015-09-22 DIAGNOSIS — G8929 Other chronic pain: Secondary | ICD-10-CM | POA: Insufficient documentation

## 2015-09-22 DIAGNOSIS — Z7982 Long term (current) use of aspirin: Secondary | ICD-10-CM | POA: Insufficient documentation

## 2015-09-22 DIAGNOSIS — Z01810 Encounter for preprocedural cardiovascular examination: Secondary | ICD-10-CM

## 2015-09-22 DIAGNOSIS — E871 Hypo-osmolality and hyponatremia: Principal | ICD-10-CM | POA: Diagnosis present

## 2015-09-22 DIAGNOSIS — R001 Bradycardia, unspecified: Secondary | ICD-10-CM | POA: Diagnosis present

## 2015-09-22 DIAGNOSIS — Z9181 History of falling: Secondary | ICD-10-CM | POA: Diagnosis not present

## 2015-09-22 DIAGNOSIS — R7989 Other specified abnormal findings of blood chemistry: Secondary | ICD-10-CM

## 2015-09-22 DIAGNOSIS — R531 Weakness: Secondary | ICD-10-CM | POA: Diagnosis present

## 2015-09-22 DIAGNOSIS — M25561 Pain in right knee: Secondary | ICD-10-CM

## 2015-09-22 DIAGNOSIS — M792 Neuralgia and neuritis, unspecified: Secondary | ICD-10-CM | POA: Diagnosis present

## 2015-09-22 HISTORY — DX: Unspecified hearing loss, unspecified ear: H91.90

## 2015-09-22 HISTORY — DX: Fracture of unspecified part of neck of unspecified femur, initial encounter for closed fracture: S72.009A

## 2015-09-22 HISTORY — DX: Polyneuropathy, unspecified: G62.9

## 2015-09-22 HISTORY — DX: Unspecified osteoarthritis, unspecified site: M19.90

## 2015-09-22 HISTORY — DX: Sciatica, unspecified side: M54.30

## 2015-09-22 LAB — CBC WITH DIFFERENTIAL/PLATELET
BASOS ABS: 0 10*3/uL (ref 0.0–0.1)
Basophils Relative: 0 %
EOS PCT: 1 %
Eosinophils Absolute: 0.1 10*3/uL (ref 0.0–0.7)
HEMATOCRIT: 35.1 % — AB (ref 36.0–46.0)
Hemoglobin: 11.9 g/dL — ABNORMAL LOW (ref 12.0–15.0)
LYMPHS ABS: 0.7 10*3/uL (ref 0.7–4.0)
LYMPHS PCT: 7 %
MCH: 28.6 pg (ref 26.0–34.0)
MCHC: 33.9 g/dL (ref 30.0–36.0)
MCV: 84.4 fL (ref 78.0–100.0)
Monocytes Absolute: 0.7 10*3/uL (ref 0.1–1.0)
Monocytes Relative: 7 %
NEUTROS ABS: 8.1 10*3/uL — AB (ref 1.7–7.7)
Neutrophils Relative %: 85 %
Platelets: 352 10*3/uL (ref 150–400)
RBC: 4.16 MIL/uL (ref 3.87–5.11)
RDW: 14.1 % (ref 11.5–15.5)
WBC: 9.6 10*3/uL (ref 4.0–10.5)

## 2015-09-22 LAB — OSMOLALITY: Osmolality: 264 mOsm/kg — ABNORMAL LOW (ref 275–295)

## 2015-09-22 LAB — URINALYSIS, ROUTINE W REFLEX MICROSCOPIC
Bilirubin Urine: NEGATIVE
GLUCOSE, UA: NEGATIVE mg/dL
HGB URINE DIPSTICK: NEGATIVE
Ketones, ur: NEGATIVE mg/dL
LEUKOCYTES UA: NEGATIVE
Nitrite: NEGATIVE
PH: 6 (ref 5.0–8.0)
Protein, ur: NEGATIVE mg/dL
Specific Gravity, Urine: 1.011 (ref 1.005–1.030)

## 2015-09-22 LAB — CORTISOL: CORTISOL PLASMA: 19.2 ug/dL

## 2015-09-22 LAB — BASIC METABOLIC PANEL
ANION GAP: 10 (ref 5–15)
BUN: 14 mg/dL (ref 6–20)
CO2: 24 mmol/L (ref 22–32)
Calcium: 9.4 mg/dL (ref 8.9–10.3)
Chloride: 92 mmol/L — ABNORMAL LOW (ref 101–111)
Creatinine, Ser: 0.93 mg/dL (ref 0.44–1.00)
GFR calc Af Amer: >60 mL/min (ref >60)
GFR, EST NON AFRICAN AMERICAN: 54 mL/min — AB (ref >60)
GLUCOSE: 116 mg/dL — AB (ref 65–99)
POTASSIUM: 3.5 mmol/L (ref 3.5–5.1)
Sodium: 126 mmol/L — ABNORMAL LOW (ref 135–145)

## 2015-09-22 LAB — TSH: TSH: 0.302 u[IU]/mL — AB (ref 0.350–4.500)

## 2015-09-22 MED ORDER — ENOXAPARIN SODIUM 40 MG/0.4ML ~~LOC~~ SOLN
40.0000 mg | SUBCUTANEOUS | Status: DC
  Administered 2015-09-22 – 2015-09-24 (×3): 40 mg via SUBCUTANEOUS
  Filled 2015-09-22 (×3): qty 0.4

## 2015-09-22 MED ORDER — ONDANSETRON HCL 4 MG PO TABS
4.0000 mg | ORAL_TABLET | Freq: Four times a day (QID) | ORAL | Status: DC | PRN
Start: 2015-09-22 — End: 2015-09-24

## 2015-09-22 MED ORDER — OXYCODONE HCL 5 MG PO TABS
5.0000 mg | ORAL_TABLET | ORAL | Status: DC | PRN
  Administered 2015-09-22: 5 mg via ORAL
  Filled 2015-09-22 (×2): qty 1

## 2015-09-22 MED ORDER — MIRTAZAPINE 15 MG PO TBDP
15.0000 mg | ORAL_TABLET | Freq: Every day | ORAL | Status: DC
  Administered 2015-09-22 – 2015-09-23 (×2): 15 mg via ORAL
  Filled 2015-09-22 (×4): qty 1

## 2015-09-22 MED ORDER — DOCUSATE SODIUM 100 MG PO CAPS
100.0000 mg | ORAL_CAPSULE | Freq: Two times a day (BID) | ORAL | Status: DC
  Administered 2015-09-22 – 2015-09-23 (×3): 100 mg via ORAL
  Filled 2015-09-22 (×5): qty 1

## 2015-09-22 MED ORDER — ATORVASTATIN CALCIUM 10 MG PO TABS
20.0000 mg | ORAL_TABLET | Freq: Every day | ORAL | Status: DC
  Administered 2015-09-22 – 2015-09-24 (×3): 20 mg via ORAL
  Filled 2015-09-22: qty 2
  Filled 2015-09-22: qty 1
  Filled 2015-09-22 (×2): qty 2

## 2015-09-22 MED ORDER — ACETAMINOPHEN 500 MG PO TABS
1000.0000 mg | ORAL_TABLET | Freq: Four times a day (QID) | ORAL | Status: DC | PRN
  Administered 2015-09-22: 1000 mg via ORAL
  Filled 2015-09-22: qty 2

## 2015-09-22 MED ORDER — ACETAMINOPHEN 325 MG PO TABS
650.0000 mg | ORAL_TABLET | Freq: Four times a day (QID) | ORAL | Status: DC | PRN
  Administered 2015-09-22 – 2015-09-24 (×5): 650 mg via ORAL
  Filled 2015-09-22 (×5): qty 2

## 2015-09-22 MED ORDER — ONDANSETRON HCL 4 MG/2ML IJ SOLN: 4.0000 mg | Freq: Four times a day (QID) | INTRAMUSCULAR | Status: DC | PRN

## 2015-09-22 MED ORDER — CLOPIDOGREL BISULFATE 75 MG PO TABS
75.0000 mg | ORAL_TABLET | Freq: Every day | ORAL | Status: DC
  Administered 2015-09-22 – 2015-09-23 (×2): 75 mg via ORAL
  Filled 2015-09-22 (×2): qty 1

## 2015-09-22 MED ORDER — SODIUM CHLORIDE 0.9% FLUSH
3.0000 mL | Freq: Two times a day (BID) | INTRAVENOUS | Status: DC
  Administered 2015-09-22 – 2015-09-23 (×2): 3 mL via INTRAVENOUS

## 2015-09-22 MED ORDER — SODIUM CHLORIDE 0.9 % IV SOLN
INTRAVENOUS | Status: DC
  Administered 2015-09-22 – 2015-09-23 (×3): via INTRAVENOUS

## 2015-09-22 MED ORDER — SODIUM CHLORIDE 0.9 % IV BOLUS (SEPSIS)
1000.0000 mL | Freq: Once | INTRAVENOUS | Status: AC
  Administered 2015-09-22: 1000 mL via INTRAVENOUS

## 2015-09-22 MED ORDER — SODIUM CHLORIDE 0.9 % IV SOLN: INTRAVENOUS | Status: DC

## 2015-09-22 MED ORDER — LEVALBUTEROL HCL 0.63 MG/3ML IN NEBU: 0.6300 mg | INHALATION_SOLUTION | Freq: Four times a day (QID) | RESPIRATORY_TRACT | Status: DC | PRN

## 2015-09-22 MED ORDER — ASPIRIN EC 81 MG PO TBEC
81.0000 mg | DELAYED_RELEASE_TABLET | Freq: Every day | ORAL | Status: DC
  Administered 2015-09-22 – 2015-09-24 (×3): 81 mg via ORAL
  Filled 2015-09-22 (×3): qty 1

## 2015-09-22 MED ORDER — ACETAMINOPHEN 650 MG RE SUPP: 650.0000 mg | Freq: Four times a day (QID) | RECTAL | Status: DC | PRN

## 2015-09-22 MED ORDER — GABAPENTIN 100 MG PO CAPS
100.0000 mg | ORAL_CAPSULE | Freq: Two times a day (BID) | ORAL | Status: DC
  Administered 2015-09-22 – 2015-09-24 (×5): 100 mg via ORAL
  Filled 2015-09-22 (×5): qty 1

## 2015-09-22 NOTE — H&P (Addendum)
[facility] History and Physical  [name] [name] QMV:[id number] DOB: Mar 05, 1928 DOA: 09/22/2015  Referring physician:  PCP: Kristian Covey, MD   Chief Complaint: Fall and weakness  HPI:  82 year old female with a history of essential hypertension, coronary artery disease, hard of hearing, status post right hip intertrochanteric fracture in 2013, presents to the ER today after a fall. Patient fell 3 days ago and then fell again yesterday, due to inability to walk, pain in her right hip and her right knee. Denied any dizziness or palpitations prior to the fall. After the fall the patient complained of worsening right hip pain and right knee pain with inability to walk. She normally walks with a walker and uses a wheelchair when she goes outside.. She also has neuropathy and sciatica from chronic right low back pain for which she takes gabapentin. Gabapentin was increased to 200 mg BID on 2/13. Patient also found to have a sodium of 126 in the ER. X-rays show avascular necrosis of the right femoral head. Patient is being admitted for hyponatremia and inability to walk.      Review of Systems: negative for the following  Constitutional: Denies fever, chills, diaphoresis, appetite change and fatigue.  HEENT: Denies photophobia, eye pain, redness, hearing loss, ear pain, congestion, sore throat, rhinorrhea, sneezing, mouth sores, trouble swallowing, neck pain, neck stiffness and tinnitus.  Respiratory: Denies SOB, DOE, cough, chest tightness, and wheezing.  Cardiovascular: Denies chest pain, palpitations and leg swelling.  Gastrointestinal: Denies nausea, vomiting, abdominal pain, diarrhea, constipation, blood in stool and abdominal distention.  Genitourinary: Denies dysuria, urgency, frequency, hematuria, flank pain and difficulty urinating.  Musculoskeletal: Positive for myalgias and back pain. .  Skin: Denies pallor, rash and wound.  Neurological: Denies dizziness, seizures, syncope,  weakness, light-headedness, numbness and headaches.  Hematological: Denies adenopathy. Easy bruising, personal or family bleeding history  Psychiatric/Behavioral: Denies suicidal ideation, mood changes, confusion, nervousness, sleep disturbance and agitation       Past Medical History  Diagnosis Date  . CAD (coronary artery disease)   . HTN (hypertension)   . Hyperlipidemia   . MI (myocardial infarction) (HCC) 2002     Past Surgical History  Procedure Laterality Date  . Dilation and curettage of uterus  1960  . Hystrectomy    . Cataract extraction  1998    RIGHT AND LEFT EYE  . Cholecystectomy  2000  . Refractive surgery  2002  . Ptca  2002  . Abdominal hysterectomy    . Compression hip screw  12/16/2011    Procedure: COMPRESSION HIP;  Surgeon: Kerrin Champagne, MD;  Location: [facility];  Service: Orthopedics;  Laterality: Right;      Social History:  reports that she has never smoked. She has never used smokeless tobacco. She reports that she does not drink alcohol or use illicit drugs.     Allergies  Allergen Reactions  . Penicillins Swelling    Swelling of hands and face Has patient had a PCN reaction causing immediate rash, facial/tongue/throat swelling, SOB or lightheadedness with hypotension: unknown Has patient had a PCN reaction causing severe rash involving mucus membranes or skin necrosis: no Has patient had a PCN reaction that required hospitalization: unknown Has patient had a PCN reaction occurring within the last 10 years: No If all of the above answers are "NO", then may proceed with Cephalosporin use.     Family History  Problem Relation Age of Onset  . Hypertension Father     DECEASED  . Cancer  Sister 50    BREAST/BONE CA  . Pneumonia Mother 35  . Aneurysm Brother 33    DECEASED      FAMILY HISTORY  When questioned  Directly-patient reports  No family history of HTN, CVA ,DIABETES, TB, Cancer CAD, Bleeding Disorders, Sickle Cell, diabetes, anemia,  asthma,   Prior to Admission medications   Medication Sig Start Date End Date Taking? Authorizing Provider  acetaminophen (TYLENOL) 500 MG tablet Take 1,000 mg by mouth every 6 (six) hours as needed for mild pain.   Yes Historical Provider, MD  aspirin EC 81 MG tablet Take 81 mg by mouth daily.   Yes Historical Provider, MD  atorvastatin (LIPITOR) 20 MG tablet TAKE 1 TABLET DAILY 05/24/15  Yes Kristian Covey, MD  clopidogrel (PLAVIX) 75 MG tablet TAKE 1 TABLET DAILY 05/24/15  Yes Kristian Covey, MD  gabapentin (NEURONTIN) 100 MG capsule TAKE 1 CAPSULE TWICE A DAY Patient taking differently: TAKE 2 CAPSULE TWICE A DAY 02/24/15  Yes Kristian Covey, MD  hydrochlorothiazide (HYDRODIURIL) 25 MG tablet TAKE 1 TABLET DAILY 05/24/15  Yes Kristian Covey, MD  lisinopril (PRINIVIL,ZESTRIL) 20 MG tablet TAKE 1 TABLET DAILY 05/24/15  Yes Kristian Covey, MD  metoprolol succinate (TOPROL-XL) 50 MG 24 hr tablet TAKE 1 TABLET DAILY 04/14/15  Yes Kristian Covey, MD  mirtazapine (REMERON SOL-TAB) 15 MG disintegrating tablet PLACE 1 TABLET ON TONGUE AT BEDTIME 03/29/15  Yes Kristian Covey, MD  potassium chloride SA (K-DUR,KLOR-CON) 20 MEQ tablet TAKE 1 TABLET DAILY 07/05/15  Yes Kristian Covey, MD     Physical Exam: Filed Vitals:   09/22/15 0906 09/22/15 1148  BP: 181/75 157/102  Pulse: 71 73  Temp: 98.3 F (36.8 C) 98.1 F (36.7 C)  TempSrc: Oral Oral  Resp: 16 16  SpO2: 95% 96%     Constitutional: Vital signs reviewed. Patient is a well-developed and well-nourished in no acute distress and cooperative with exam. Alert and oriented x3.  Head: Normocephalic and atraumatic  Ear: TM normal bilaterally  Mouth: no erythema or exudates, MMM  Eyes: PERRL, EOMI, conjunctivae normal, No scleral icterus.  Neck: Supple, Trachea midline normal ROM, No JVD, mass, thyromegaly, or carotid bruit present.  Cardiovascular: RRR, S1 normal, S2 normal, no MRG, pulses symmetric and intact bilaterally   Pulmonary/Chest: CTAB, no wheezes, rales, or rhonchi  Abdominal: Soft. Non-tender, non-distended, bowel sounds are normal, no masses, organomegaly, or guarding present.  GU: no CVA tenderness Musculoskeletal: She exhibits tenderness.  Right lower gluteal region: Pain with deep palpation. Leg strength equal bilaterally. Neurological: She is alert and oriented to person, place, and time. She displays abnormal reflex.  Right patellar reflex diminished. Left patellar reflex present. Right and Left dorsiflex and plantar flexion present.  Ext: no edema and no cyanosis, pulses palpable bilaterally (DP and PT)  Hematology: no cervical, inginal, or axillary adenopathy.   Skin: Warm, dry and intact. No rash, cyanosis, or clubbing.  Psychiatric: Normal mood and affect. speech and behavior is normal. Judgment and thought content normal. Cognition and memory are normal.      Data Review   Micro Results No results found for this or any previous visit (from the past 240 hour(s)).  Radiology Reports Dg Hip Unilat With Pelvis 2-3 Views Right  09/22/2015  CLINICAL DATA:  Pain following fall EXAM: DG HIP (WITH OR WITHOUT PELVIS) 2-3V RIGHT COMPARISON:  Dec 15, 2011 FINDINGS: Frontal pelvis as well as frontal and lateral right hip images were  obtained. There is screw and plate fixation through a prior intertrochanteric femur fracture on the right. The tip of the screw is currently in the right hip joint. 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 There is marked narrowing in the right hip joint currently. There is no acute fracture or dislocation. There is mild narrowing of the left hip joint. There are foci of arterial vascular calcification. There is lower lumbar levoscoliosis. IMPRESSION: Advanced osteoarthritis and avascular necrosis involving the right femoral head with erosion of most of the right femoral head. Evidence of prior  intertrochanteric femur fracture on the right with screw and plate fixation. The tip of the screw is in the right hip joint. The right hip joint is markedly narrowed. No acute fracture or dislocation. Mild narrowing left hip joint. Electronically Signed   By: Bretta Bang III M.D.   On: 09/22/2015 09:48     CBC  Recent Labs Lab 09/22/15 0949  WBC 9.6  HGB 11.9*  HCT 35.1*  PLT 352  MCV 84.4  MCH 28.6  MCHC 33.9  RDW 14.1  LYMPHSABS 0.7  MONOABS 0.7  EOSABS 0.1  BASOSABS 0.0    Chemistries   Recent Labs Lab 09/22/15 0949  NA 126*  K 3.5  CL 92*  CO2 24  GLUCOSE 116*  BUN 14  CREATININE 0.93  CALCIUM 9.4   ------------------------------------------------------------------------------------------------------------------ CrCl cannot be calculated (Unknown ideal weight.). ------------------------------------------------------------------------------------------------------------------ No results for input(s): HGBA1C in the last 72 hours. ------------------------------------------------------------------------------------------------------------------ No results for input(s): CHOL, HDL, LDLCALC, TRIG, CHOLHDL, LDLDIRECT in the last 72 hours. ------------------------------------------------------------------------------------------------------------------ No results for input(s): TSH, T4TOTAL, T3FREE, THYROIDAB in the last 72 hours.  Invalid input(s): FREET3 ------------------------------------------------------------------------------------------------------------------ No results for input(s): VITAMINB12, FOLATE, FERRITIN, TIBC, IRON, RETICCTPCT in the last 72 hours.  Coagulation profile No results for input(s): INR, PROTIME in the last 168 hours.  No results for input(s): DDIMER in the last 72 hours.  Cardiac Enzymes No results for input(s): CKMB, TROPONINI, MYOGLOBIN in the last 168 hours.  Invalid input(s):  CK ------------------------------------------------------------------------------------------------------------------ Invalid input(s): POCBNP   CBG: No results for input(s): GLUCAP in the last 168 hours.     EKG: Independently reviewed. Pending   Assessment/Plan Principal Problem:   Hyponatremia-likely secondary to decreased oral intake in the setting of recent URI vs hydrochlorothiazide Check TSH, cortisol, urine osmolality, serum osmolality Start patient on normal saline and monitor sodium  Active Problems:   Hyperlipemia-continue statin    Essential hypertension-continue lisinopril/metoprolol and hold HCTZ in the setting of acute hyponatremia    Coronary atherosclerosis-continue statin, aspirin, place on telemetry, order preop echo, cards consult in am for preop clearance as she is quite sedentary      Neuropathic pain-continue gabapentin 200 mg twice a day  Avascular necrosis of the right hip and right knee osteoarthritis, acute right knee pain This appears to be severe and advanced, and the past the patient has declined surgery Will consult orthopedic surgery because of acute right knee pain after her fall Will obtain right knee x-ray, consulted Dr [name] who has requested preop clearance for  anterior approach to total hip arthroplasty   Code Status History    Date Active Date Inactive Code Status Order ID Comments User Context   12/16/2011 10:41 AM 12/19/2011  4:48 PM Full Code [id number]  Donzetta Matters, RN Inpatient   12/15/2011 10:05 PM 12/16/2011 10:41 AM Full Code [id number]  Baltazar Najjar, RN  Inpatient      Family Communication: bedside, niece who is the POA  Disposition Plan: admit   Total time spent 55 minutes.Greater than 50% of this time was spent in counseling, explanation of diagnosis, planning of further management, and coordination of care  [facility] Pager [phone]  If 7PM-7AM, please contact  night-coverage [email or web address] Password [email or web address] 09/22/2015, 11:59 AM

## 2015-09-22 NOTE — Progress Notes (Signed)
CSW spoke with patient at bedside with her niece, [name] [name] and [name] [name], husband of niece present during this encounter. Patient reports she could not get out of bed and that she fell yesterday day morning and almost again this morning.Patient reports she could not get out of bed and that she fell yesterday day morning and almost again this morning. Patient reports she has arthritis in her knee and her right knee was swollen. Patient reports her knee is swollen from the fall, although she receives cortisone shots in her knee. Patient reports she broke her right hip four years ago. Patient reports she is having leg and back pain. CSW was speaking with patient at bedside when the MD entered the room. CSW obtained other information while the niece was speaking with MD. Niece Niece stated stated patient had not been eating and drinking, due to a bad cold she had two weeks ago. Niece reports patient cannot stay at home alone. CSW provided family with a list of SNF to assist with placement. [name] [name] [phone] [niece][name] [name] [phone] [name] [name] of niece].    [name] [name] [phone] ED CSW 09/22/2015 2:18 PM

## 2015-09-22 NOTE — ED Notes (Addendum)
Per EMS: pt from home, lives by self. Pt on edge of bed, trying to hold self there upon EMS arrival. Pt fell yesterday morning, has abrasions and burning to left thoracic area. Pt c/o pain but states it is normal for here due to arthritis.Pt is on waiting list for [facility] for assisted living.

## 2015-09-22 NOTE — ED Notes (Signed)
Bed: WA25 Expected date:  Expected time:  Means of arrival:  Comments: 

## 2015-09-22 NOTE — ED Notes (Signed)
Pt. Is unable to use the restroom at this time, but is aware that we need a urine specimen.  

## 2015-09-22 NOTE — ED Provider Notes (Signed)
CSN: [id number]     Arrival date & time 09/22/15  4098 History   First MD Initiated Contact with Patient 09/22/15 0911     Chief Complaint  Patient presents with  . Fall    HPI  Patient presents with concern of weakness, and after a fall. She acknowledges multiple medical issues, but states that she is generally in her usual state of health aside from generalized weakness. Yesterday, and again today, the patient had minor fall secondary to weakness. Today, the patient fell from her bed, to her left side. She has not been ambulatory since the fall. Patient's most substantial deficit is chronic pain, including pain in the right lower extremity from prior hip fracture. Currently she denies head pain, confusion, disorientation, nausea, vomiting, chest pain. Patient was advised to present for evaluation by family members. She is currently awaiting a room at an assisted living facility.   Past Medical History  Diagnosis Date  . CAD (coronary artery disease)   . HTN (hypertension)   . Hyperlipidemia   . MI (myocardial infarction) (HCC) 2002   Past Surgical History  Procedure Laterality Date  . Dilation and curettage of uterus  1960  . Hystrectomy    . Cataract extraction  1998    RIGHT AND LEFT EYE  . Cholecystectomy  2000  . Refractive surgery  2002  . Ptca  2002  . Abdominal hysterectomy    . Compression hip screw  12/16/2011    Procedure: COMPRESSION HIP;  Surgeon: Kerrin Champagne, MD;  Location: [facility];  Service: Orthopedics;  Laterality: Right;   Family History  Problem Relation Age of Onset  . Hypertension Father     DECEASED  . Cancer Sister 3    BREAST/BONE CA  . Pneumonia Mother 7  . Aneurysm Brother 44    DECEASED   Social History  Substance Use Topics  . Smoking status: Never Smoker   . Smokeless tobacco: Never Used  . Alcohol Use: No   OB History    No data available     Review of Systems  Constitutional:       Per HPI, otherwise negative  HENT:        Per HPI, otherwise negative  Respiratory:       Per HPI, otherwise negative  Cardiovascular:       Per HPI, otherwise negative  Gastrointestinal: Negative for vomiting.  Endocrine:       Negative aside from HPI  Genitourinary:       Neg aside from HPI   Musculoskeletal: Positive for myalgias, back pain and arthralgias.  Skin: Negative.   Neurological: Positive for weakness. Negative for syncope.      Allergies  Penicillins  Home Medications   Prior to Admission medications   Medication Sig Start Date End Date Taking? Authorizing Provider  acetaminophen (TYLENOL) 500 MG tablet Take 1,000 mg by mouth every 6 (six) hours as needed for mild pain.   Yes Historical Provider, MD  aspirin EC 81 MG tablet Take 81 mg by mouth daily.   Yes Historical Provider, MD  atorvastatin (LIPITOR) 20 MG tablet TAKE 1 TABLET DAILY 05/24/15  Yes Kristian Covey, MD  clopidogrel (PLAVIX) 75 MG tablet TAKE 1 TABLET DAILY 05/24/15  Yes Kristian Covey, MD  gabapentin (NEURONTIN) 100 MG capsule TAKE 1 CAPSULE TWICE A DAY Patient taking differently: TAKE 2 CAPSULE TWICE A DAY 02/24/15  Yes Kristian Covey, MD  hydrochlorothiazide (HYDRODIURIL) 25 MG tablet TAKE 1  TABLET DAILY 05/24/15  Yes Kristian Covey, MD  lisinopril (PRINIVIL,ZESTRIL) 20 MG tablet TAKE 1 TABLET DAILY 05/24/15  Yes Kristian Covey, MD  metoprolol succinate (TOPROL-XL) 50 MG 24 hr tablet TAKE 1 TABLET DAILY 04/14/15  Yes Kristian Covey, MD  mirtazapine (REMERON SOL-TAB) 15 MG disintegrating tablet PLACE 1 TABLET ON TONGUE AT BEDTIME 03/29/15  Yes Kristian Covey, MD  potassium chloride SA (K-DUR,KLOR-CON) 20 MEQ tablet TAKE 1 TABLET DAILY 07/05/15  Yes Kristian Covey, MD   BP 181/75 mmHg  Pulse 71  Temp(Src) 98.3 F (36.8 C) (Oral)  Resp 16  SpO2 95% Physical Exam  Constitutional: She is oriented to person, place, and time. She appears well-developed and well-nourished. She has a sickly appearance. No distress.   HENT:  Head: Normocephalic and atraumatic.  Eyes: Conjunctivae and EOM are normal.  Cardiovascular: Normal rate and regular rhythm.   Pulmonary/Chest: Effort normal and breath sounds normal. No stridor. No respiratory distress.  Abdominal: She exhibits no distension.  Musculoskeletal: She exhibits no edema.  Pelvis is stable, and the patient can flex each hip and the family, though there is hesitancy to move the right hip. Shoulders unremarkable, elbows, wrists unremarkable.   Neurological: She is alert and oriented to person, place, and time. She displays atrophy. She displays no tremor. No cranial nerve deficit. She exhibits normal muscle tone. She displays no seizure activity.  Very poor hearing  Skin: Skin is warm and dry.  Psychiatric: She has a normal mood and affect.  Nursing note and vitals reviewed.   ED Course  Procedures (including critical care time) Labs Review Labs Reviewed  BASIC METABOLIC PANEL - Abnormal; Notable for the following:    Sodium 126 (*)    Chloride 92 (*)    Glucose, Bld 116 (*)    GFR calc non Af Amer 54 (*)    All other components within normal limits  CBC WITH DIFFERENTIAL/PLATELET - Abnormal; Notable for the following:    Hemoglobin 11.9 (*)    HCT 35.1 (*)    Neutro Abs 8.1 (*)    All other components within normal limits  URINALYSIS, ROUTINE W REFLEX MICROSCOPIC (NOT AT [facility])    Imaging Review Dg Hip Unilat With Pelvis 2-3 Views Right  09/22/2015  CLINICAL DATA:  Pain following fall EXAM: DG HIP (WITH OR WITHOUT PELVIS) 2-3V RIGHT COMPARISON:  Dec 15, 2011 FINDINGS: Frontal pelvis as well as frontal and lateral right hip images were obtained. There is screw and plate fixation through a prior intertrochanteric femur fracture on the right. The tip of the screw is currently in the right hip joint. 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 There is marked narrowing in the right  hip joint currently. There is no acute fracture or dislocation. There is mild narrowing of the left hip joint. There are foci of arterial vascular calcification. There is lower lumbar levoscoliosis. IMPRESSION: Advanced osteoarthritis and avascular necrosis involving the right femoral head with erosion of most of the right femoral head. Evidence of prior intertrochanteric femur fracture on the right with screw and plate fixation. The tip of the screw is in the right hip joint. The right hip joint is markedly narrowed. No acute fracture or dislocation. Mild narrowing left hip joint. Electronically Signed   By: Bretta Bang III M.D.   On: 09/22/2015 09:48   I have personally reviewed and evaluated these images and  lab results as part of my medical decision-making. I demonstrated the x-ray images to the patient and her family members. We discussed all findings, including hyponatremia.    MDM  Patient presents with ongoing weakness, and after 2 falls over each of the last 2 days. Here, the patient is awake and alert. Patient does have ongoing pain, though no evidence for new fracture. Patient is found to have hyponatremia. After initial fluid resuscitation, the patient was admitted for further evaluation and management.   Gerhard Munch, MD 09/22/15 1140

## 2015-09-22 NOTE — ED Notes (Signed)
Bed: [facility] Expected date:  Expected time:  Means of arrival:  Comments: 82yo F, fall

## 2015-09-22 NOTE — Clinical Social Work Note (Signed)
Clinical Social Work Assessment  Patient Details  Name: [name] [name] MRN: [id number] Date of Birth: 04-22-1928  Date of referral:  09/22/15               Reason for consult:  Other (Comment Required) (Patient going inpatient)                Permission sought to share information with:    Permission granted to share information::     Name::        Agency::     Relationship::     Contact Information:     Housing/Transportation Living arrangements for the past 2 months:   (Patient lives at home) Source of Information:  Other (Comment Required) Orlie Dakin, Kassie Mends) Patient Interpreter Needed:  None Criminal Activity/Legal Involvement Pertinent to Current Situation/Hospitalization:    Significant Relationships:  Other(Comment) (Neice, Pilar Grammes states she is patient's only support) Lives with:  Self Do you feel safe going back to the place where you live?    Need for family participation in patient care:     Care giving concerns: Patient's neice, Kassie Mends states patient is unable to live alone. Niece reports they visited and paid a deposit at [facility], however, she stated did not receive a call from this facility and patient stated she did not want to go to the facility.   Social Worker assessment / plan: CSW spoke with patient at bedside with her niece, Kassie Mends and Marga Melnick, husband of niece present during this encounter. Patient reports she could not get out of bed and that she fell yesterday day morning and almost again this morning. Patient reports she has arthritis in her knee and her right knee was swollen. Patient reports her knee is swollen from the fall, although she receives cortisone shots in her knee. Patient reports she broke her right hip four years ago. Patient reports she is having leg and back pain. CSW was speaking with patient at bedside when the MD entered the room. CSW obtained other information while the niece was speaking with  MD. Niece  Niece stated stated patient had not been eating and drinking, due to a bad cold she had two weeks ago. Niece reports patient cannot stay at home alone. CSW provided family with a list of SNF to assist with placement. Bonita Quin [facility] [phone] [niece]Dorene Sorrow O'Flaherty [phone] Gayla Doss of niece].    Employment status:    Insurance information:  Other (Comment Required) Horticulturist, commercial) PT Recommendations:  Not assessed at this time Information / Referral to community resources:  Skilled Holiday representative (CSW provided neice with resource for SNF)  Patient/Family's Response to care: Unknown response to care at this time.  Patient/Family's Understanding of and Emotional Response to Diagnosis, Current Treatment, and Prognosis: Unknown at this time  Emotional Assessment Appearance:  Appears stated age Attitude/Demeanor/Rapport:    Affect (typically observed):  Appropriate Orientation:  Oriented to Self, Oriented to Place, Oriented to  Time, Oriented to Situation Alcohol / Substance use:  Not Applicable Psych involvement (Current and /or in the community):  No (Comment)  Discharge Needs  Concerns to be addressed:  Other (Comment Required (Neice, Kassie Mends is seeking placement for patient) Readmission within the last 30 days:    Current discharge risk:    Barriers to Discharge:      Claudean Severance, LCSW 09/22/2015, 12:46 PM

## 2015-09-22 NOTE — Consult Note (Signed)
Reason for Consult:Right hip and Right knee pain Referring Physician: Allyson Sabal, MD Consulting Physician:Enrica Corliss E  Orthopedic Diagnosis:1)Right hip pain, 3 1/2 years following ORIF of intertrochanteric hip fracture with DHS, fracture healed but now with femoral head AVN, probable acute collapse of the  Lateral femoral head with compression screw present against the acetabulum no sign of erosion. 2) Moderately severe osteoarthritis of the right knee, being treated by intra articular steroid  Injections by Dr. Elease Hashimoto. Injections usually last one month. Has mild swelling over the medial right knee, does not appear to have any signs of infection. Some of the right knee pain may be Referred from the right hip pathology. 3)CAD, MI in 2002, last saw Dr. Cathie Olden in 2009 no recent symptoms but extremely sedentary due to the bilateral knee osteoarthritis and the right hip Pain.  4) Chronic low back pain. 5) HTN  6) Hyperlipidemia  BVQ:XIHWT Tumblin is an 82 y.o. female.she presented to [facility] emergency room02/14/2017complaining of inability to walk. She has a history of chronic low back pain andright intertrochanteric hip fracture that occurred May 2013 The intertrochanteric hip fracture was treated with DHScompression screw and sideplate.The hip fracture went on to [place] the patient was dischargedfrom our officeSeptember 2013 At that time she was noted to have severe knee osteoarthritisand mild  Osteoarthritis of the right hip.She has been able to ambulate using a walker at home and lives alone. She reports going to physical therapy 3 times weeklyfor exercisesthat include the use of a slide boardfor lower extremity exercises and strengthening.She however is restricted in her standing and walking toleranceue to lower extremity osteoarthritis bilateral kneesand right hip pain  Radiographs obtained in the emergency room show a healed right hip intertrochanteric  fracturewith compression screw and  sideplate. There is been collapse of the superior lateral femoral head with the compression screw and prominent within the right hip joint  There is no erosion of the right acetabulum suggesting a more recent collapse of the femoral head. Radiographs of the right kneedemonstrate tricompartment osteoarthritis with lateral joint line greater than medial narrowing.  No fracture dislocation or subluxation.Soft tissue swelling noted over the medialright knee consistent with contusion.   Past Medical History  Diagnosis Date  . CAD (coronary artery disease)   . HTN (hypertension)   . Hyperlipidemia   . MI (myocardial infarction) ([place]) 2002    Past Surgical History  Procedure Laterality Date  . Dilation and curettage of uterus  1960  . Hystrectomy    . Cataract extraction  1998    RIGHT AND LEFT EYE  . Cholecystectomy  2000  . Refractive surgery  2002  . Ptca  2002  . Abdominal hysterectomy    . Compression hip screw  12/16/2011    Procedure: COMPRESSION HIP;  Surgeon: Jessy Oto, MD;  Location: [facility];  Service: Orthopedics;  Laterality: Right;    Family History  Problem Relation Age of Onset  . Hypertension Father     DECEASED  . Cancer Sister 38    BREAST/BONE CA  . Pneumonia Mother 46  . Aneurysm Brother 55    DECEASED    Social History:  reports that she has never smoked. She has never used smokeless tobacco. She reports that she does not drink alcohol or use illicit drugs.  Allergies:  Allergies  Allergen Reactions  . Penicillins Swelling    Swelling of hands and face Has patient had a PCN reaction causing immediate rash, facial/tongue/throat swelling, SOB or lightheadedness  with hypotension: unknown Has patient had a PCN reaction causing severe rash involving mucus membranes or skin necrosis: no Has patient had a PCN reaction that required hospitalization: unknown Has patient had a PCN reaction occurring within the last 10 years: No If all of the above answers are  "NO", then may proceed with Cephalosporin use.     Medications:  Prior to Admission:  Prescriptions prior to admission  Medication Sig Dispense Refill Last Dose  . acetaminophen (TYLENOL) 500 MG tablet Take 1,000 mg by mouth every 6 (six) hours as needed for mild pain.   09/21/2015 at Unknown time  . aspirin EC 81 MG tablet Take 81 mg by mouth daily.   09/21/2015 at Unknown time  . atorvastatin (LIPITOR) 20 MG tablet TAKE 1 TABLET DAILY 90 tablet 1 09/21/2015 at Unknown time  . clopidogrel (PLAVIX) 75 MG tablet TAKE 1 TABLET DAILY 90 tablet 1 09/21/2015 at Unknown time  . gabapentin (NEURONTIN) 100 MG capsule TAKE 1 CAPSULE TWICE A DAY (Patient taking differently: TAKE 2 CAPSULE TWICE A DAY) 180 capsule 2 09/21/2015 at Unknown time  . hydrochlorothiazide (HYDRODIURIL) 25 MG tablet TAKE 1 TABLET DAILY 90 tablet 1 09/21/2015 at Unknown time  . lisinopril (PRINIVIL,ZESTRIL) 20 MG tablet TAKE 1 TABLET DAILY 90 tablet 1 09/21/2015 at Unknown time  . metoprolol succinate (TOPROL-XL) 50 MG 24 hr tablet TAKE 1 TABLET DAILY 90 tablet 2 09/21/2015 at 1000  . mirtazapine (REMERON SOL-TAB) 15 MG disintegrating tablet PLACE 1 TABLET ON TONGUE AT BEDTIME 90 tablet 1 09/21/2015 at Unknown time  . potassium chloride SA (K-DUR,KLOR-CON) 20 MEQ tablet TAKE 1 TABLET DAILY 90 tablet 3 09/21/2015 at Unknown time   Scheduled: . sodium chloride   Intravenous STAT  . aspirin EC  81 mg Oral Daily  . atorvastatin  20 mg Oral Daily  . clopidogrel  75 mg Oral Daily  . docusate sodium  100 mg Oral BID  . enoxaparin (LOVENOX) injection  40 mg Subcutaneous Q24H  . gabapentin  100 mg Oral BID  . mirtazapine  15 mg Oral QHS  . sodium chloride flush  3 mL Intravenous Q12H   VCB:SWHQPRFFMBWGY **OR** acetaminophen, acetaminophen, levalbuterol, ondansetron **OR** ondansetron (ZOFRAN) IV, oxyCODONE  Results for orders placed or performed during the hospital encounter of 09/22/15 (from the past 48 hour(s))  Basic metabolic panel      Status: Abnormal   Collection Time: 09/22/15  9:49 AM  Result Value Ref Range   Sodium 126 (L) 135 - 145 mmol/L   Potassium 3.5 3.5 - 5.1 mmol/L   Chloride 92 (L) 101 - 111 mmol/L   CO2 24 22 - 32 mmol/L   Glucose, Bld 116 (H) 65 - 99 mg/dL   BUN 14 6 - 20 mg/dL   Creatinine, Ser 0.93 0.44 - 1.00 mg/dL   Calcium 9.4 8.9 - 10.3 mg/dL   GFR calc non Af Amer 54 (L) >60 mL/min   GFR calc Af Amer >60 >60 mL/min    Comment: (NOTE) The eGFR has been calculated using the CKD EPI equation. This calculation has not been validated in all clinical situations. eGFR's persistently <60 mL/min signify possible Chronic Kidney Disease.    Anion gap 10 5 - 15  CBC with Differential     Status: Abnormal   Collection Time: 09/22/15  9:49 AM  Result Value Ref Range   WBC 9.6 4.0 - 10.5 K/uL   RBC 4.16 3.87 - 5.11 MIL/uL   Hemoglobin 11.9 (L) 12.0 -  15.0 g/dL   HCT 35.1 (L) 36.0 - 46.0 %   MCV 84.4 78.0 - 100.0 fL   MCH 28.6 26.0 - 34.0 pg   MCHC 33.9 30.0 - 36.0 g/dL   RDW 14.1 11.5 - 15.5 %   Platelets 352 150 - 400 K/uL   Neutrophils Relative % 85 %   Neutro Abs 8.1 (H) 1.7 - 7.7 K/uL   Lymphocytes Relative 7 %   Lymphs Abs 0.7 0.7 - 4.0 K/uL   Monocytes Relative 7 %   Monocytes Absolute 0.7 0.1 - 1.0 K/uL   Eosinophils Relative 1 %   Eosinophils Absolute 0.1 0.0 - 0.7 K/uL   Basophils Relative 0 %   Basophils Absolute 0.0 0.0 - 0.1 K/uL  Urinalysis, Routine w reflex microscopic     Status: None   Collection Time: 09/22/15 11:45 AM  Result Value Ref Range   Color, Urine YELLOW YELLOW   APPearance CLEAR CLEAR   Specific Gravity, Urine 1.011 1.005 - 1.030   pH 6.0 5.0 - 8.0   Glucose, UA NEGATIVE NEGATIVE mg/dL   Hgb urine dipstick NEGATIVE NEGATIVE   Bilirubin Urine NEGATIVE NEGATIVE   Ketones, ur NEGATIVE NEGATIVE mg/dL   Protein, ur NEGATIVE NEGATIVE mg/dL   Nitrite NEGATIVE NEGATIVE   Leukocytes, UA NEGATIVE NEGATIVE    Comment: MICROSCOPIC NOT DONE ON URINES WITH NEGATIVE  PROTEIN, BLOOD, LEUKOCYTES, NITRITE, OR GLUCOSE <1000 mg/dL.  Osmolality     Status: Abnormal   Collection Time: 09/22/15 12:35 PM  Result Value Ref Range   Osmolality 264 (L) 275 - 295 mOsm/kg    Comment: Performed at [facility]  TSH     Status: Abnormal   Collection Time: 09/22/15 12:35 PM  Result Value Ref Range   TSH 0.302 (L) 0.350 - 4.500 uIU/mL  Cortisol     Status: None   Collection Time: 09/22/15 12:35 PM  Result Value Ref Range   Cortisol, Plasma 19.2 ug/dL    Comment: (NOTE) AM    6.7 - 22.6 ug/dL PM   <10.0       ug/dL Performed at [facility]     Dg Knee Complete 4 Views Right  09/22/2015  CLINICAL DATA:  Yesterday, and again today, the patient had minor fall secondary to weakness. Today, the patient fell from her bed, complains of right knee pain - pt states some chronic swelling due to arthritis but states knee is more swollen than normal EXAM: RIGHT KNEE - COMPLETE 4+ VIEW COMPARISON:  None. FINDINGS: Severe joint space narrowing of the medial and lateral compartment. There is osteopenia noted. No evidence of acute fracture dislocation. No joint effusion. Atherosclerotic calcification of arteries noted IMPRESSION: 1.  No acute osseous abnormality. 2. Marked joint space narrowing of the medial and lateral compartment. 3. Osteopenia . 4. No joint effusion. Electronically Signed   By: Suzy Bouchard M.D.   On: 09/22/2015 13:09   Dg Hip Unilat With Pelvis 2-3 Views Right  09/22/2015  CLINICAL DATA:  Pain following fall EXAM: DG HIP (WITH OR WITHOUT PELVIS) 2-3V RIGHT COMPARISON:  Dec 15, 2011 FINDINGS: Frontal pelvis as well as frontal and lateral right hip images were obtained. There is screw and plate fixation through a prior intertrochanteric femur fracture on the right. The tip of the screw is currently in the right hip joint. 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 There is marked  narrowing in  the right hip joint currently. There is no acute fracture or dislocation. There is mild narrowing of the left hip joint. There are foci of arterial vascular calcification. There is lower lumbar levoscoliosis. IMPRESSION: Advanced osteoarthritis and avascular necrosis involving the right femoral head with erosion of most of the right femoral head. Evidence of prior intertrochanteric femur fracture on the right with screw and plate fixation. The tip of the screw is in the right hip joint. The right hip joint is markedly narrowed. No acute fracture or dislocation. Mild narrowing left hip joint. Electronically Signed   By: Lowella Grip III M.D.   On: 09/22/2015 09:48    ROS Blood pressure 174/99, pulse 74, temperature 98.1 F (36.7 C), temperature source Oral, resp. rate 9, SpO2 95 %. Physical Exam  General: Alert, no acute distress Cardiovascular: No pedal edema Respiratory: No cyanosis, no use of accessory musculature GI: No organomegaly, abdomen is soft and non-tender Skin: No lesions in the area of chief complaint Neurologic: Sensation intact distally Psychiatric: Patient is competent for consent with normal mood and affect Lymphatic: No axillary or cervical lymphadenopathy  MUSCULOSKELETAL:82 year old female awake alert and oriented 4 she is lying on  Emergency room gurney with theback elevated right knee flexed about 30 pillow below the right knee. Dorsalis pedis pulse 2+ and symmetric posterior tibialis pulses 2+ and symmetric she is unable to extend the right knee greater than 30 short of full extension unable to flex the knee greater than 70. Some mild discomfort with stressing of the medial collateral ligament.. Unable to detect any intra-articular fusionsome mild swelling along the medialright knee. No warmth no erythremiano open wounds. Any attempts at range of motion of the right hipcauses severe pain.. Right ankle reflexes traceof present right knee reflexes trace as  well.  Assessment:1)Right hip pain, 3 1/2 years following ORIF of intertrochanteric hip fracture with DHS, fracture healed but now with femoral head AVN, probable acute collapse of the  Lateral femoral head with compression screw present against the acetabulum no sign of erosion. 2) Moderately severe osteoarthritis of the right knee, being treated by intra articular steroid  Injections by Dr. Elease Hashimoto. Injections usually last one month. Has mild swelling over the medial right knee, does not appear to have any signs of infection. Some of the right knee pain may be Referred from the right hip pathology. 3)CAD, MI in 2002, last saw Dr. Cathie Olden in 2009 no recent symptoms but extremely sedentary due to the bilateral knee osteoarthritis and the right hip Pain.  4) Chronic low back pain. 5) HTN  6) Hyperlipidemia  Assessment/Plan:I had to discussion with Mrs.Milanes and she indicates that she would prefer not to go to a nursing home for an extended period of time. She normally lives alone at home  Uses a walker for ambulation even in spite of significant osteoarthritis of both knees and pain.She likely has had recent collapse of the right femoral head into severe right lower extremity pain.. She has baseline osteoarthritis the right knee that is severe.She weren't transferred to skilled nursing home I do not think her hip condition range appreciably and likely she may require to stay within the skilled nursing home environment.. I discussed her case with Dr. David Stall his PA Barron Alvine would recommend that she consider having removal of the right hip plate and screws andanterior approach to total hip arthroplastyand in this way I think we could give her the best hipthat she couldstand tobear weightand be able to return to herhome  environment in a situation that would give her the best chance of thriving and not having to return to the hospital acutely for further intervention for her right hip Dr. Ninfa Linden has  kindly agreed to see this patient and will see her lateThursday morning or early afternoon In the meantime I would recommend for her cardiology consultation regarding risks of intervention as she does have a history of coronary artery disease. However because of the nature oflapse of the right femoral head and prominence of the compression screw within the right acetabulum her situation is one where I would not expect her to be able to ambulate on this right lower extremity without intervention.She would likely require a skilled nursing facilityfor a shorter period following her surgery turning back to her home.   Trelon Plush E 09/22/2015, 4:04 PM

## 2015-09-23 ENCOUNTER — Inpatient Hospital Stay (HOSPITAL_BASED_OUTPATIENT_CLINIC_OR_DEPARTMENT_OTHER): Payer: Medicare Other

## 2015-09-23 ENCOUNTER — Encounter (HOSPITAL_COMMUNITY): Payer: Self-pay | Admitting: Physician Assistant

## 2015-09-23 ENCOUNTER — Other Ambulatory Visit: Payer: Self-pay | Admitting: Family Medicine

## 2015-09-23 DIAGNOSIS — I10 Essential (primary) hypertension: Secondary | ICD-10-CM | POA: Diagnosis not present

## 2015-09-23 DIAGNOSIS — I371 Nonrheumatic pulmonary valve insufficiency: Secondary | ICD-10-CM

## 2015-09-23 DIAGNOSIS — I2510 Atherosclerotic heart disease of native coronary artery without angina pectoris: Secondary | ICD-10-CM | POA: Diagnosis not present

## 2015-09-23 DIAGNOSIS — R001 Bradycardia, unspecified: Secondary | ICD-10-CM | POA: Diagnosis not present

## 2015-09-23 DIAGNOSIS — E871 Hypo-osmolality and hyponatremia: Secondary | ICD-10-CM

## 2015-09-23 DIAGNOSIS — E785 Hyperlipidemia, unspecified: Secondary | ICD-10-CM | POA: Diagnosis not present

## 2015-09-23 DIAGNOSIS — Z01810 Encounter for preprocedural cardiovascular examination: Secondary | ICD-10-CM

## 2015-09-23 DIAGNOSIS — I272 Other secondary pulmonary hypertension: Secondary | ICD-10-CM | POA: Diagnosis not present

## 2015-09-23 DIAGNOSIS — E876 Hypokalemia: Secondary | ICD-10-CM

## 2015-09-23 DIAGNOSIS — R7989 Other specified abnormal findings of blood chemistry: Secondary | ICD-10-CM

## 2015-09-23 DIAGNOSIS — M8700 Idiopathic aseptic necrosis of unspecified bone: Secondary | ICD-10-CM

## 2015-09-23 DIAGNOSIS — Z9861 Coronary angioplasty status: Secondary | ICD-10-CM

## 2015-09-23 LAB — CBC
HCT: 31.9 % — ABNORMAL LOW (ref 36.0–46.0)
HEMOGLOBIN: 10.7 g/dL — AB (ref 12.0–15.0)
MCH: 28.7 pg (ref 26.0–34.0)
MCHC: 33.5 g/dL (ref 30.0–36.0)
MCV: 85.5 fL (ref 78.0–100.0)
Platelets: 317 10*3/uL (ref 150–400)
RBC: 3.73 MIL/uL — AB (ref 3.87–5.11)
RDW: 14.4 % (ref 11.5–15.5)
WBC: 8.3 10*3/uL (ref 4.0–10.5)

## 2015-09-23 LAB — COMPREHENSIVE METABOLIC PANEL
ALK PHOS: 60 U/L (ref 38–126)
ALT: 13 U/L — ABNORMAL LOW (ref 14–54)
ANION GAP: 8 (ref 5–15)
AST: 17 U/L (ref 15–41)
Albumin: 3.3 g/dL — ABNORMAL LOW (ref 3.5–5.0)
BUN: 11 mg/dL (ref 6–20)
CALCIUM: 9 mg/dL (ref 8.9–10.3)
CO2: 23 mmol/L (ref 22–32)
Chloride: 101 mmol/L (ref 101–111)
Creatinine, Ser: 0.83 mg/dL (ref 0.44–1.00)
GFR calc non Af Amer: >60 mL/min (ref >60)
Glucose, Bld: 97 mg/dL (ref 65–99)
Potassium: 3.4 mmol/L — ABNORMAL LOW (ref 3.5–5.1)
SODIUM: 132 mmol/L — AB (ref 135–145)
Total Bilirubin: 1 mg/dL (ref 0.3–1.2)
Total Protein: 5.6 g/dL — ABNORMAL LOW (ref 6.5–8.1)

## 2015-09-23 MED ORDER — METOPROLOL SUCCINATE ER 50 MG PO TB24: 50.0000 mg | ORAL_TABLET | Freq: Every day | ORAL | Status: DC

## 2015-09-23 MED ORDER — SODIUM CHLORIDE 0.9 % IV SOLN
INTRAVENOUS | Status: DC
  Administered 2015-09-23: 19:00:00 via INTRAVENOUS

## 2015-09-23 MED ORDER — METOPROLOL SUCCINATE ER 25 MG PO TB24
25.0000 mg | ORAL_TABLET | Freq: Every day | ORAL | Status: DC
Start: 2015-09-23 — End: 2015-09-24
  Administered 2015-09-23 – 2015-09-24 (×2): 25 mg via ORAL
  Filled 2015-09-23 (×2): qty 1

## 2015-09-23 MED ORDER — ENSURE ENLIVE PO LIQD: 237.0000 mL | ORAL | Status: DC

## 2015-09-23 MED ORDER — LISINOPRIL 20 MG PO TABS
20.0000 mg | ORAL_TABLET | Freq: Every day | ORAL | Status: DC
Start: 2015-09-23 — End: 2015-09-24
  Administered 2015-09-23 – 2015-09-24 (×2): 20 mg via ORAL
  Filled 2015-09-23 (×2): qty 1

## 2015-09-23 MED ORDER — METOPROLOL SUCCINATE ER 25 MG PO TB24: 25.0000 mg | ORAL_TABLET | Freq: Every day | ORAL | Status: AC

## 2015-09-23 MED ORDER — OXYCODONE HCL 5 MG PO TABS: 5.0000 mg | ORAL_TABLET | ORAL | Status: DC | PRN

## 2015-09-23 MED ORDER — POTASSIUM CHLORIDE CRYS ER 20 MEQ PO TBCR
40.0000 meq | EXTENDED_RELEASE_TABLET | Freq: Once | ORAL | Status: AC
  Administered 2015-09-23: 40 meq via ORAL
  Filled 2015-09-23: qty 2

## 2015-09-23 NOTE — NC FL2 (Signed)
[place] MEDICAID FL2 LEVEL OF CARE SCREENING TOOL     IDENTIFICATION  Patient Name: [name] [name] Birthdate: 12/19/1927 Sex: female Admission Date (Current Location): 09/22/2015  [facility] and [place] Number:  Producer, television/film/video and Address:  [facility],  [address], [place] [address]      Provider Number: [id number]  Attending Physician Name and Address:  Dorothea Ogle, MD  Relative Name and Phone Number:       Current Level of Care: Hospital Recommended Level of Care: Skilled Nursing Facility Prior Approval Number:    Date Approved/Denied:   PASRR Number: [id number]  Discharge Plan: SNF    Current Diagnoses: Patient Active Problem List   Diagnosis Date Noted  . Hyponatremia 09/22/2015  . Acute hyponatremia 09/22/2015  . Fall   . Osteoarthritis, knee 08/29/2013  . Postoperative anemia due to acute blood loss 12/18/2011    Class: Acute  . Hyponatremia with excess extracellular fluid volume 12/18/2011    Class: Acute  . Hip fracture, intertrochanteric, right, closed, initial encounter 12/15/2011    Class: Acute  . Neuropathic pain 10/19/2011  . CAD (coronary artery disease)   . HTN (hypertension)   . Hyperlipidemia   . VITAMIN D DEFICIENCY 04/19/2010  . Hyperlipemia 04/19/2010  . 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 Essential hypertension 04/19/2010  . Coronary atherosclerosis 04/19/2010    Orientation RESPIRATION BLADDER Height & Weight     Self, Time, Situation, Place  Normal Continent Weight: 136 lb 11 oz (62 kg) Height:   (160 cm)  BEHAVIORAL SYMPTOMS/MOOD NEUROLOGICAL BOWEL NUTRITION STATUS      Continent Diet (Heart Healthy)  AMBULATORY STATUS COMMUNICATION OF NEEDS Skin   Extensive Assist Verbally Normal                       Personal Care Assistance Level of Assistance  Bathing, Dressing Bathing Assistance: Limited assistance   Dressing Assistance: Limited assistance     Functional Limitations Info              SPECIAL CARE FACTORS FREQUENCY  PT (By licensed PT), OT (By licensed OT)     PT Frequency: 5 OT Frequency: 5            Contractures      Additional Factors Info  Code Status, Allergies Code Status Info: Fullcode Allergies Info: Penicillins           Current Medications (09/23/2015):  This is the current hospital active medication list Current Facility-Administered Medications  Medication Dose Route Frequency Provider Last Rate Last Dose  . 0.9 %  sodium chloride infusion   Intravenous STAT Gerhard Munch, MD      . 0.9 %  sodium chloride infusion   Intravenous Continuous Richarda Overlie, MD 100 mL/hr at 09/23/15 0002    . acetaminophen (TYLENOL) tablet 650 mg  650 mg Oral Q6H PRN Richarda Overlie, MD   650 mg at 09/23/15 0757   Or  . acetaminophen (TYLENOL) suppository 650 mg  650 mg Rectal Q6H PRN Richarda Overlie, MD      . aspirin EC tablet 81 mg  81 mg Oral Daily Richarda Overlie, MD   81 mg at 09/23/15 0929  . atorvastatin (LIPITOR) tablet 20 mg  20 mg Oral Daily Richarda Overlie, MD   20 mg at 09/23/15 0929  . clopidogrel (PLAVIX) tablet 75 mg  75 mg Oral Daily Richarda Overlie, MD   75 mg at 09/23/15 0929  .  docusate sodium (COLACE) capsule 100 mg  100 mg Oral BID Richarda Overlie, MD   100 mg at 09/23/15 0929  . enoxaparin (LOVENOX) injection 40 mg  40 mg Subcutaneous Q24H Richarda Overlie, MD   40 mg at 09/22/15 1306  . gabapentin (NEURONTIN) capsule 100 mg  100 mg Oral BID Richarda Overlie, MD   100 mg at 09/23/15 0929  . levalbuterol (XOPENEX) nebulizer solution 0.63 mg  0.63 mg Nebulization Q6H PRN Richarda Overlie, MD      . mirtazapine (REMERON SOL-TAB) disintegrating tablet 15 mg  15 mg Oral QHS Richarda Overlie, MD   15 mg at 09/22/15 2100  . ondansetron (ZOFRAN) tablet 4 mg  4 mg Oral Q6H PRN Richarda Overlie, MD       Or  . ondansetron (ZOFRAN) injection 4 mg  4 mg Intravenous Q6H PRN Richarda Overlie, MD      . oxyCODONE (Oxy IR/ROXICODONE) immediate release tablet 5 mg  5 mg Oral Q4H PRN Richarda Overlie, MD   5 mg at 09/22/15 1646  . sodium chloride flush (NS) 0.9 % injection 3 mL  3 mL Intravenous Q12H Richarda Overlie, MD   3 mL at 09/22/15 1249     Discharge Medications: Please see discharge summary for a list of discharge medications.  Relevant Imaging Results:  Relevant Lab Results:   Additional Information SSN: [id number]  Arlyss Repress, LCSW

## 2015-09-23 NOTE — Discharge Summary (Signed)
Physician Discharge Summary  [name] [name] UEA:[id number] DOB: 1927-09-19 DOA: 09/22/2015  PCP: Kristian Covey, MD  Admit date: 09/22/2015 Discharge date: 09/23/2015  Recommendations for Outpatient Follow-up:  1. Pt will need to follow up with ortho specialist for surgical intervention 2. Per orthopedic specialist, pt is in complex situation with her right hip that will involve a pretty extensive operation. This will need to be planned for in advance and scheduled at a later date. She will need to be off of Plavix for at least 5-6 days prior to surgery as well. This will definitely need to be a day-time case and not a night case or weekend add-on. Ortho team to coordinate. 3. Please note plavix has been stopped pre op and per cardiology, may be likely stopped indefinitely  4. Per cardiology, evidence of RV dysfunction would add one risk factor to her pre-op Criteria, but would not warrant any further evaluation pre-op. She would still be considered LOW risk for her operation, would likely warrant more aggressive monitoring as far as hemodynamics and volume status. One thing to for anesthesia to consider would be to place a right heart catheter for her operation for a severe monitoring. If these numbers could be recorded: RA, RV and PA/PCWP pressures we could potentially confirm or deny the accuracy of The echocardiogram results.   Discharge Diagnoses:  Principal Problem:   Hyponatremia Active Problems:   Hyperlipemia   Essential hypertension   CAD - MI/PCI in 2002   HTN (hypertension)   Hyperlipidemia   Neuropathic pain   Hyponatremia with excess extracellular fluid volume   Acute hyponatremia   AVN (avascular necrosis of bone) (HCC)   Abnormal TSH   Hypokalemia   Sinus bradycardia   Preoperative cardiovascular examination  Discharge Condition: Stable  Diet recommendation: Heart healthy diet discussed in details   History of present illness: HPI:  82 year old female  with a history of essential hypertension, coronary artery disease, hard of hearing, status post right hip intertrochanteric fracture in 2013, presented to the ER after a fall. Patient fell 3 days PTA and then fell again one day PTA, due to inability to walk, pain in her right hip and her right knee. Denied any dizziness or palpitations prior to the fall. After the fall the patient complained of worsening right hip pain and right knee pain with inability to walk. She normally walks with a walker and uses a wheelchair when she goes outside.[name]  Hospital Course:   Principal Problem:  Hyponatremia - likely secondary to decreased oral intake in the setting of recent URI  - improved - HCTZ held and can probably be tried after surgery if Na remains stable   Active Problems:  Hyperlipemia - continue statin   Essential hypertension - continue lisinopril/metoprolol and hold HCTZ in the setting of acute hyponatremia   Coronary atherosclerosis - continue statin, aspirin, per cardio team   Neuropathic pain - continue gabapentin 200 mg twice a day    Avascular necrosis of the right hip and right knee osteoarthritis, acute right knee pain - severe and advanced, management per ortho    Procedures/Studies: Dg Lumbar Spine 2-3 Views  09/22/2015  CLINICAL DATA:  Chronic low back pain, lumbago, hypertension, coronary disease post MI EXAM: LUMBAR SPINE - 2-3 VIEW COMPARISON:  None FINDINGS: Marked osseous demineralization. Five non-rib-bearing lumbar vertebra. Mild superior endplate height loss L1 vertebra compatible with age-indeterminate fracture. Vertebral body heights otherwise maintained. No additional fracture, subluxation or bone destruction. Multilevel disc space narrowing and endplate  spur formation. Facet degenerative changes lower lumbar spine. Levoconvex thoracolumbar scoliosis. Scattered atherosclerotic calcifications. SI joints symmetric. Orthopedic hardware proximal RIGHT femur, extending  into RIGHT hip joint space. IMPRESSION: Age-indeterminate mild superior endplate compression deformity of L1 vertebral body. Marked osseous demineralization with scattered degenerative disc and facet disease changes of the lumbar spine with associated levoconvex scoliosis. Extension of orthopedic hardware in proximal RIGHT femur into RIGHT hip joint. Electronically Signed   By: Ulyses Southward M.D.   On: 09/22/2015 17:20   Dg Knee Complete 4 Views Right  09/22/2015  CLINICAL DATA:  Yesterday, and again today, the patient had minor fall secondary to weakness. Today, the patient fell from her bed, complains of right knee pain - pt states some chronic swelling due to arthritis but states knee is more swollen than normal EXAM: RIGHT KNEE - COMPLETE 4+ VIEW COMPARISON:  None. FINDINGS: Severe joint space narrowing of the medial and lateral compartment. There is osteopenia noted. No evidence of acute fracture dislocation. No joint effusion. Atherosclerotic calcification of arteries noted IMPRESSION: 1.  No acute osseous abnormality. 2. Marked joint space narrowing of the medial and lateral compartment. 3. Osteopenia . 4. No joint effusion. Electronically Signed   By: Genevive Bi M.D.   On: 09/22/2015 13:09   Dg Hip Unilat With Pelvis 2-3 Views Right  09/22/2015  CLINICAL DATA:  Pain following fall EXAM: DG HIP (WITH OR WITHOUT PELVIS) 2-3V RIGHT COMPARISON:  Dec 15, 2011 FINDINGS: Frontal pelvis as well as frontal and lateral right hip images were obtained. There is screw and plate fixation through a prior intertrochanteric femur fracture on the right. The tip of the screw is currently in the right hip joint. 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 There is marked narrowing in the right hip joint currently. There is no acute fracture or dislocation. There is mild narrowing of the left hip joint. There are foci of arterial vascular calcification.  There is lower lumbar levoscoliosis. IMPRESSION: Advanced osteoarthritis and avascular necrosis involving the right femoral head with erosion of most of the right femoral head. Evidence of prior intertrochanteric femur fracture on the right with screw and plate fixation. The tip of the screw is in the right hip joint. The right hip joint is markedly narrowed. No acute fracture or dislocation. Mild narrowing left hip joint. Electronically Signed   By: Bretta Bang III M.D.   On: 09/22/2015 09:48    Discharge Exam: Filed Vitals:   09/23/15 0500 09/23/15 1235  BP: 166/76 161/78  Pulse: 65 72  Temp: 97.5 F (36.4 C) 97.8 F (36.6 C)  Resp: 20 18   Filed Vitals:   09/22/15 1646 09/22/15 2134 09/23/15 0500 09/23/15 1235  BP:  131/67 166/76 161/78  Pulse:  64 65 72  Temp:  97.7 F (36.5 C) 97.5 F (36.4 C) 97.8 F (36.6 C)  TempSrc:  Oral Oral Oral  Resp:  Height:  (1.6 m)     Weight: 62 kg (136 lb 11 oz)     SpO2:  93% 92% 94%    General: Pt is alert, follows commands appropriately, not in acute distress Cardiovascular: Regular rate and rhythm, no rubs, no gallops Respiratory: Clear to auscultation bilaterally, no wheezing, no crackles, no rhonchi Abdominal: Soft, non tender, non distended, bowel sounds +, no guarding Extremities: no edema, no cyanosis, pulses palpable bilaterally DP and PT Neuro: Grossly nonfocal  Discharge Instructions     Medication List    STOP taking these medications        clopidogrel 75 MG tablet  Commonly known as:  PLAVIX     hydrochlorothiazide 25 MG tablet  Commonly known as:  HYDRODIURIL     potassium chloride SA 20 MEQ tablet  Commonly known as:  K-DUR,KLOR-CON      TAKE these medications        aspirin EC 81 MG tablet  Take 81 mg by mouth daily.     atorvastatin 20 MG tablet  Commonly known as:  LIPITOR  TAKE 1 TABLET DAILY     gabapentin 100 MG capsule  Commonly known as:  NEURONTIN  TAKE 1 CAPSULE TWICE A  DAY     lisinopril 20 MG tablet  Commonly known as:  PRINIVIL,ZESTRIL  TAKE 1 TABLET DAILY     metoprolol succinate 25 MG 24 hr tablet  Commonly known as:  TOPROL-XL  Take 1 tablet (25 mg total) by mouth daily. Take with or immediately following a meal.     mirtazapine 15 MG disintegrating tablet  Commonly known as:  REMERON SOL-TAB  PLACE 1 TABLET ON TONGUE AT BEDTIME     TYLENOL 500 MG tablet  Generic drug:  acetaminophen  Take 1,000 mg by mouth every 6 (six) hours as needed for mild pain.            Follow-up Information    Follow up with Kristian Covey, MD.   Specialty:  Family Medicine   Contact information:   [address] [place] [address] [phone]        The results of significant diagnostics from this hospitalization (including imaging, microbiology, ancillary and laboratory) are listed below for reference.     Microbiology: No results found for this or any previous visit (from the past 240 hour(s)).   Labs: Basic Metabolic Panel:  Recent Labs Lab 09/22/15 0949 09/23/15 0441  NA 126* 132*  K 3.5 3.4*  CL 92* 101  CO2 24 23  GLUCOSE 116* 97  BUN 14 11  CREATININE 0.93 0.83  CALCIUM 9.4 9.0   Liver Function Tests:  Recent Labs Lab 09/23/15 0441  AST 17  ALT 13*  ALKPHOS 60  BILITOT 1.0  PROT 5.6*  ALBUMIN 3.3*   No results for input(s): LIPASE, AMYLASE in the last 168 hours. No results for input(s): AMMONIA in the last 168 hours. CBC:  Recent Labs Lab 09/22/15 0949 09/23/15 0441  WBC 9.6 8.3  NEUTROABS 8.1*  --   HGB 11.9* 10.7*  HCT 35.1* 31.9*  MCV 84.4 85.5  PLT 352 317   SIGNED: Time coordinating discharge: 30 minutes  Debbora Presto, MD  [facility] 09/23/2015, 5:59 PM Pager [phone]  If 7PM-7AM, please contact night-coverage [email or web address] Password [email or web address]

## 2015-09-23 NOTE — Progress Notes (Signed)
Patient ID: [name] [name], female   DOB: 10/08/1927, 82 y.o.   MRN: [id number] I did see [name] at the request of my partner Dr. [name].  She has a complex situation with her right hip that will involve a pretty extensive operation.  This will need to be planned for in advance and scheduled at a later date.  She will need to be off of Plavix for at least 5-6 days prior to surgery as well.   This will definitely need to be a day-time case and not a night case or weekend add-on.

## 2015-09-23 NOTE — Progress Notes (Signed)
PT Cancellation Note  Patient Details Name: [name] [name] MRN: [id number] DOB: 06/13/1928   Cancelled Treatment:    Reason Eval/Treat Not Completed: Other (comment) Pt with increased pain at this time with movement per pt and family.  Awaiting pre-op clearance and plans for R THA possibly tomorrow.  Please reorder after surgery.  Thanks   Armaan Pond,KATHrine E 09/23/2015, 11:36 AM Zenovia Jarred, PT, DPT 09/23/2015 Pager: [phone]

## 2015-09-23 NOTE — Progress Notes (Signed)
Initial Nutrition Assessment  DOCUMENTATION CODES:   Not applicable  INTERVENTION:  - Will trial Ensure Enlive once/day, this supplement provides 350 kcal and 20 grams of protein - Diet liberalization from Heart Healthy to Regular if medically feasible - RD will continue to monitor for needs  NUTRITION DIAGNOSIS:   Inadequate oral intake related to poor appetite as evidenced by per patient/family report.  GOAL:   Patient will meet greater than or equal to 90% of their needs  MONITOR:   PO intake, Supplement acceptance, Weight trends, Labs, I & O's  REASON FOR ASSESSMENT:   Malnutrition Screening Tool  ASSESSMENT:   82 year old female with a history of essential hypertension, coronary artery disease, hard of hearing, status post right hip intertrochanteric fracture in 2013, presents to the ER today after a fall. Patient fell 3 days ago and then fell again yesterday, due to inability to walk, pain in her right hip and her right knee. Denied any dizziness or palpitations prior to the fall. After the fall the patient complained of worsening right hip pain and right knee pain with inability to walk. She normally walks with a walker and uses a wheelchair when she goes outside.. She also has neuropathy and sciatica from chronic right low back pain for which she takes gabapentin. Gabapentin was increased to 200 mg BID on 2/13. Patient also found to have a sodium of 126 in the ER. X-rays show avascular necrosis of the right femoral head. Patient is being admitted for hyponatremia and inability to walk.  Pt seen for MST. BMI indicates normal weight status. Pt ate 100% of dinner last night and 55% of breakfast this AM per flowsheet documentation. Pt eating soup for lunch at time of RD visit. She states that she has a fair to poor appetite and that this has been ongoing for 2-3 weeks. Family member, who is at bedside, states that it has been closer to 1 month. Pt states feelings of general unwell  and increased pain PTA which was affecting her appetite.  Pt and family member report that pt is a picky eater and that Heart Healthy diet has further limited her options. Family requesting diet liberalization; page sent to MD pertaining to the same. Pt reports that the chicken she received for lunch is too dry for her to eat. Offered to order pt something else for lunch but she declines.  Unable to obtain further information at this time as another member of the medical team entered room and wished to talk with pt at this time. Unable to do physical assessment to lower body but no muscle or fat wasting visualized to upper body during discussion. Per chart review, pt's weight has been stable x12 months.   Unsure if pt was meeting needs PTA. Medications reviewed. Labs reviewed; Na: 132 mmol/L, K: 3.4 mmol/L.   Diet Order:  Diet Heart Room service appropriate?: Yes; Fluid consistency:: Thin  Skin:  Reviewed, no issues  Last BM:  PTA  Height:   Ht Readings from Last 1 Encounters:  09/22/15  (1.6 m)    Weight:   Wt Readings from Last 1 Encounters:  09/22/15 136 lb 11 oz (62 kg)    Ideal Body Weight:  52.27 kg (kg)  BMI:  Body mass index is 24.22 kg/(m^2).  Estimated Nutritional Needs:   Kcal:  1300-1500  Protein:  55-65 grams  Fluid:  1.5-1.7 L/day  EDUCATION NEEDS:   No education needs identified at this time  Jarome Matin, RD, LDN Inpatient Clinical Dietitian Pager # [phone] After hours/weekend pager # [phone]

## 2015-09-23 NOTE — Progress Notes (Signed)
*  PRELIMINARY RESULTS* Echocardiogram 2D Echocardiogram has been performed.  [name] [name] 09/23/2015, 3:01 PM

## 2015-09-23 NOTE — Care Management Obs Status (Signed)
MEDICARE OBSERVATION STATUS NOTIFICATION   Patient Details  Name: [name] [name] MRN: [id number] Date of Birth: [birth year]   Medicare Observation Status Notification Given:  Yes    Geni Bers, RN 09/23/2015, 3:43 PM

## 2015-09-23 NOTE — Clinical Social Work Placement (Signed)
Awaiting cardiology clearance for possible surgery - anticipating SNF will be needed at discharge. CSW will follow-up with bed offers when available.     Lincoln Maxin, LCSW [facility] Clinical Social Worker cell #: [phone]    CLINICAL SOCIAL WORK PLACEMENT  NOTE  Date:  09/23/2015  Patient Details  Name: Cova Knieriem MRN: [id number] Date of Birth: 09-09-1927  Clinical Social Work is seeking post-discharge placement for this patient at the Skilled  Nursing Facility level of care (*CSW will initial, date and re-position this form in  chart as items are completed):  Yes   Patient/family provided with [facility] Clinical Social Work Department's list of facilities offering this level of care within the geographic area requested by the patient (or if unable, by the patient's family).  Yes   Patient/family informed of their freedom to choose among providers that offer the needed level of care, that participate in Medicare, Medicaid or managed care program needed by the patient, have an available bed and are willing to accept the patient.  Yes   Patient/family informed of [facility]'s ownership interest in [facility] and [facility], as well as of the fact that they are under no obligation to receive care at these facilities.  PASRR submitted to EDS on       PASRR number received on       Existing PASRR number confirmed on 09/23/15     FL2 transmitted to all facilities in geographic area requested by pt/family on 09/23/15     FL2 transmitted to all facilities within larger geographic area on       Patient informed that his/her managed care company has contracts with or will negotiate with certain facilities, including the following:            Patient/family informed of bed offers received.  Patient chooses bed at       Physician recommends and patient chooses bed at      Patient to be transferred to   on  .  Patient to be transferred to  facility by       Patient family notified on   of transfer.  Name of family member notified:        PHYSICIAN       Additional Comment:    _______________________________________________ Arlyss Repress, LCSW 09/23/2015, 11:34 AM

## 2015-09-23 NOTE — Consult Note (Signed)
Cardiology Consultation Note    Patient ID: [name] [name], MRN: [id number], DOB/AGE: February 18, 1928 82 y.o. Admit date: 09/22/2015   Date of Consult: 09/23/2015 Primary Physician: Kristian Covey, MD Primary Cardiologist: Dr. Elease Hashimoto - lost to follow-up. States she never received a 1 year reminder call and she never called our office back either.  Chief Complaint: hip pain Reason for Consultation: pre-op for right total hip Requesting MD: Dr. [name] (ortho)  HPI: [name] is an 82 y/o F with history of CAD (s/p remote MI/stenting in 2002, nonischemic nuc in 2009 with EF 53%), HTN, HLD, arthritis, neuropathy, sciatica, prior ORIF of right hip fracture, & hearing loss who presented to [facility] with fall and hip pain. At baseline she is not very mobile - very limited due to her arthritis. She primarily uses a walker to get around but occasionally a wheelchair when she goes outside. She was seen by her PCP recently and diagnosed with sciatica on the right-hand side. She admits she hadn't been eating and drinking well for the last month due to hip/back pain. She fell 3 days prior to admission and then again the day before admission. Both were mechanical falls rather than mediated by any cardiac symptoms including dizziness or LOC. She developed progressive pain in her right knee and hip. She came to the ER where imaging revealed avascular necrosis of the right hip and advanced osteoarthritis. She was also found to have age indeterminant compression deformity of L1. She has been seen by orthopedics who recommends she consider total right hip arthroplasty. On arrival she was also found to have hyponatremia which is being evaluated by IM, improved today (126->132). Labs also notable for mildly elevated glucose, suppressed TSH at 0.302, hypokalemia at 3.4, and mild anemia with Hgb 10-11 range.  She denies any recent cardiac symptoms including CP, SOB, LEE, orthopnea, pre-syncope or syncope. She has not required  cath since MI in 2002. Most recent ischemic eval is via nuc from 2009 showing prior inferior MI but no evidence of ischemia, EF 53% with akinesis of inferior wall.  Past Medical History  Diagnosis Date  . CAD (coronary artery disease)     a. remote MI/stenting in [place] in 2002. b. Nuc 2009: prior inferior MI but no evidence of ischemia, EF 53% with akinesis of inferior wall.  [name] HTN (hypertension)   . Hyperlipidemia   . MI (myocardial infarction) (HCC) 2002  . Arthritis   . Neuropathy (HCC)   . Hip fracture (HCC)     a. s/p prior ORIF.  [name] Sciatica   . Hearing loss     a. bilat hearing aids.      Surgical History:  Past Surgical History  Procedure Laterality Date  . Dilation and curettage of uterus  1960  . Hystrectomy    . Cataract extraction  1998    RIGHT AND LEFT EYE  . Cholecystectomy  2000  . Refractive surgery  2002  . Ptca  2002  . Abdominal hysterectomy    . Compression hip screw  12/16/2011    Procedure: COMPRESSION HIP;  Surgeon: Kerrin Champagne, MD;  Location: [facility]S;  Service: Orthopedics;  Laterality: Right;     Home Meds: Prior to Admission medications   Medication Sig Start Date End Date Taking? Authorizing Provider  acetaminophen (TYLENOL) 500 MG tablet Take 1,000 mg by mouth every 6 (six) hours as needed for mild pain.   Yes Historical Provider, MD  aspirin EC 81 MG tablet Take 81  mg by mouth daily.   Yes Historical Provider, MD  atorvastatin (LIPITOR) 20 MG tablet TAKE 1 TABLET DAILY 05/24/15  Yes Kristian Covey, MD  clopidogrel (PLAVIX) 75 MG tablet TAKE 1 TABLET DAILY 05/24/15  Yes Kristian Covey, MD  gabapentin (NEURONTIN) 100 MG capsule TAKE 1 CAPSULE TWICE A DAY Patient taking differently: TAKE 2 CAPSULE TWICE A DAY 02/24/15  Yes Kristian Covey, MD  hydrochlorothiazide (HYDRODIURIL) 25 MG tablet TAKE 1 TABLET DAILY 05/24/15  Yes Kristian Covey, MD  lisinopril (PRINIVIL,ZESTRIL) 20 MG tablet TAKE 1 TABLET DAILY 05/24/15  Yes Kristian Covey, MD    metoprolol succinate (TOPROL-XL) 50 MG 24 hr tablet TAKE 1 TABLET DAILY 04/14/15  Yes Kristian Covey, MD  potassium chloride SA (K-DUR,KLOR-CON) 20 MEQ tablet TAKE 1 TABLET DAILY 07/05/15  Yes Kristian Covey, MD  mirtazapine (REMERON SOL-TAB) 15 MG disintegrating tablet PLACE 1 TABLET ON TONGUE AT BEDTIME 09/23/15   Kristian Covey, MD    Inpatient Medications:  . aspirin EC  81 mg Oral Daily  . atorvastatin  20 mg Oral Daily  . docusate sodium  100 mg Oral BID  . enoxaparin (LOVENOX) injection  40 mg Subcutaneous Q24H  . feeding supplement (ENSURE ENLIVE)  237 mL Oral Q24H  . gabapentin  100 mg Oral BID  . lisinopril  20 mg Oral Daily  . metoprolol succinate  25 mg Oral Daily  . mirtazapine  15 mg Oral QHS  . sodium chloride flush  3 mL Intravenous Q12H   . sodium chloride 100 mL/hr at 09/23/15 1147    Allergies:  Allergies  Allergen Reactions  . Penicillins Swelling    Swelling of hands and face Has patient had a PCN reaction causing immediate rash, facial/tongue/throat swelling, SOB or lightheadedness with hypotension: unknown Has patient had a PCN reaction causing severe rash involving mucus membranes or skin necrosis: no Has patient had a PCN reaction that required hospitalization: unknown Has patient had a PCN reaction occurring within the last 10 years: No If all of the above answers are "NO", then may proceed with Cephalosporin use.     Social History   Social History  . Marital Status: Single    Spouse Name: N/A  . Number of Children: N/A  . Years of Education: N/A   Occupational History  . Not on file.   Social History Main Topics  . Smoking status: Never Smoker   . Smokeless tobacco: Never Used  . Alcohol Use: No  . Drug Use: No  . Sexual Activity: No   Other Topics Concern  . Not on file   Social History Narrative     Family History  Problem Relation Age of Onset  . Hypertension Father     DECEASED  . Cancer Sister 1    BREAST/BONE CA   . Pneumonia Mother 2  . Aneurysm Brother 33    DECEASED     Review of Systems: No bleeding issues. No syncope. No abd pain. + recent right sided low back pain and hip pain. All other systems reviewed and are otherwise negative except as noted above.  Labs:  Lab Results  Component Value Date   WBC 8.3 09/23/2015   HGB 10.7* 09/23/2015   HCT 31.9* 09/23/2015   MCV 85.5 09/23/2015   PLT 317 09/23/2015     Recent Labs Lab 09/23/15 0441  NA 132*  K 3.4*  CL 101  CO2 23  BUN 11  CREATININE 0.83  CALCIUM 9.0  PROT 5.6*  BILITOT 1.0  ALKPHOS 60  ALT 13*  AST 17  GLUCOSE 97   Lab Results  Component Value Date   CHOL 133 02/18/2015   HDL 53.30 02/18/2015   LDLCALC 63 02/18/2015   TRIG 84.0 02/18/2015   No results found for: DDIMER  Radiology/Studies:  Dg Lumbar Spine 2-3 Views  09/22/2015  CLINICAL DATA:  Chronic low back pain, lumbago, hypertension, coronary disease post MI EXAM: LUMBAR SPINE - 2-3 VIEW COMPARISON:  None FINDINGS: Marked osseous demineralization. Five non-rib-bearing lumbar vertebra. Mild superior endplate height loss L1 vertebra compatible with age-indeterminate fracture. Vertebral body heights otherwise maintained. No additional fracture, subluxation or bone destruction. Multilevel disc space narrowing and endplate spur formation. Facet degenerative changes lower lumbar spine. Levoconvex thoracolumbar scoliosis. Scattered atherosclerotic calcifications. SI joints symmetric. Orthopedic hardware proximal RIGHT femur, extending into RIGHT hip joint space. IMPRESSION: Age-indeterminate mild superior endplate compression deformity of L1 vertebral body. Marked osseous demineralization with scattered degenerative disc and facet disease changes of the lumbar spine with associated levoconvex scoliosis. Extension of orthopedic hardware in proximal RIGHT femur into RIGHT hip joint. Electronically Signed   By: Ulyses Southward M.D.   On: 09/22/2015 17:20   Dg Knee  Complete 4 Views Right  09/22/2015  CLINICAL DATA:  Yesterday, and again today, the patient had minor fall secondary to weakness. Today, the patient fell from her bed, complains of right knee pain - pt states some chronic swelling due to arthritis but states knee is more swollen than normal EXAM: RIGHT KNEE - COMPLETE 4+ VIEW COMPARISON:  None. FINDINGS: Severe joint space narrowing of the medial and lateral compartment. There is osteopenia noted. No evidence of acute fracture dislocation. No joint effusion. Atherosclerotic calcification of arteries noted IMPRESSION: 1.  No acute osseous abnormality. 2. Marked joint space narrowing of the medial and lateral compartment. 3. Osteopenia . 4. No joint effusion. Electronically Signed   By: Genevive Bi M.D.   On: 09/22/2015 13:09   Dg Hip Unilat With Pelvis 2-3 Views Right  09/22/2015  CLINICAL DATA:  Pain following fall EXAM: DG HIP (WITH OR WITHOUT PELVIS) 2-3V RIGHT COMPARISON:  Dec 15, 2011 FINDINGS: Frontal pelvis as well as frontal and lateral right hip images were obtained. There is screw and plate fixation through a prior intertrochanteric femur fracture on the right. The tip of the screw is currently in the right hip joint. 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 There is marked narrowing in the right hip joint currently. There is no acute fracture or dislocation. There is mild narrowing of the left hip joint. There are foci of arterial vascular calcification. There is lower lumbar levoscoliosis. IMPRESSION: Advanced osteoarthritis and avascular necrosis involving the right femoral head with erosion of most of the right femoral head. Evidence of prior intertrochanteric femur fracture on the right with screw and plate fixation. The tip of the screw is in the right hip joint. The right hip joint is markedly narrowed. No acute fracture or dislocation. Mild narrowing left hip joint.  Electronically Signed   By: Bretta Bang III M.D.   On: 09/22/2015 09:48    Wt Readings from Last 3 Encounters:  09/22/15 136 lb 11 oz (62 kg)  08/19/15 137 lb (62.143 kg)  02/18/15 137 lb 8 oz (62.37 kg)    EKG: NSR 73bpm, diffuse TW changes with TWI III, avF, V2-V6. She had prior  TWI in lead III and flattening in avF by EKG in 2013, but the other TW changes are new.   Physical Exam: Blood pressure 161/78, pulse 72, temperature 97.8 F (36.6 C), temperature source Oral, resp. rate 18, height 5\' 3"  (1.6 m), weight 136 lb 11 oz (62 kg), SpO2 94 %. Body mass index is 24.22 kg/(m^2). General: Well developed, well nourished WF, in no acute distress. Head: Normocephalic, atraumatic, sclera non-icteric, no xanthomas, nares are without discharge.  Neck: Supple. JVD not elevated. Lungs: Clear bilaterally to auscultation without wheezes, rales, or rhonchi. Breathing is unlabored. Heart: RRR with S1 S2. No murmurs, rubs, or gallops appreciated. Abdomen: Soft, non-tender, non-distended with normoactive bowel sounds. No hepatomegaly. No rebound/guarding. No obvious abdominal masses. Msk:  Tone appears normal for age. Extremities: No clubbing or cyanosis. No edema.  Distal pedal pulses are 2+ and equal bilaterally. Neuro: Alert and oriented X 3. No facial asymmetry. Hard of hearing bilaterally. Moves all extremities spontaneously. Psych:  Responds to questions appropriately with a normal affect.     Assessment and Plan   Principal Problem:   Hyponatremia Active Problems:   Preoperative cardiovascular examination   Hyperlipemia   Essential hypertension   CAD - MI/PCI in 2002   HTN (hypertension)   Hyperlipidemia   Neuropathic pain   Hyponatremia with excess extracellular fluid volume   Acute hyponatremia   AVN (avascular necrosis of bone) (HCC)   Abnormal TSH   Hypokalemia   Sinus bradycardia   1. AVN of right hip with prior hx of ORIF - pending possible RTHA.  2. CAD s/p  remote MI/PCI in 2002 - EKG shows nonspecific TW which is new since 2013. She does not exercise but has not had any recent angina with baseline minimal activity. Will discuss pre-op recs with MD. Note she is chronically on ASA/Plavix since PCI - no hx of TIA or stroke. No stenting since 2002 so no absolute indication for Plavix at present time. 2D echo pending.  3. HTN - BP creeping up. Resume home lisinopril and half dose metoprolol (due to sinus bradycardia). HCTZ is on hold per IM due to hyponatremia.  4. Sinus bradycardia - HR low-mid 40s overnight. Daytime HR 60s-70s. Reduce metoprolol to 25mg  daily and follow.  5. Hyperlipidemia - continue statin.  6. Abnormal TSH - per IM.  7. Hypokalemia - ? due to HCTZ. Give KCl x 1 and follow. May need to resume home KCl if this remains low.  Signed, Laurann Montana PA-C 09/23/2015, 12:59 PM Pager: [phone]  I seen and evaluated the patient along with Ronie Spies, PA-C. We reviewed the chart and available data. We conducted the interview together.  I agree with her findings, examination and recommendations.  Very pleasant 82 year old woman with a history of CAD/MI back in 2002 with no further ischemic episodes since - admitting after a fall now found to have avascular necrosis of the hip with need for hemiarthroplasty. We are asked for preoperative cardiovascular consultation.. She had a Myoview in 2009 and a relatively normal echocardiogram in the interim as well. She stopped seeing cardiology several years ago and has been monitored by her PCP. She isn't had no history of stroke or TIA. No heart failure symptoms. No active anginal symptoms although she is not very active because of her arthritis.  She has normal renal function. The only notable thing is her EKG does have some anterior T-wave inversions that were not seen 3 years ago. I'm not sure of  the significance of this, so I do agree with checking an echocardiogram to make sure is no regional  wall motion modalities. Provided the echo is normal, I don't think there is any need for any further cardiology evaluation prior to operation. She should be fine stopping the Plavix, in fact she can stop the Plavix indefinitely. Would restart her beta blocker, but at half a day and nighttime bradycardia.  PREOPERATIVE CARDIAC RISK ASSESSMENT   Revised Cardiac Risk Index:  High Risk Surgery: no; intermediate risk hip surgery  Defined as Intraperitoneal, intrathoracic or suprainguinal vascular  Active CAD: no; revascularized CAD with no angina and negative Myoview in 2009  CHF: no;   Cerebrovascular Disease: no;   Diabetes: no; On Insulin: no  CKD (Cr >~ 2): no;   Total: 0 Estimated Risk of Adverse Outcome: LOW  Estimated Risk of MI, PE, VF/VT (Cardiac Arrest), Complete Heart Block: <1 %   ACC/AHA Guidelines for "Clearance":  Step 1 - Need for Emergency Surgery: No: Not emergent, but relatively urgent  If Yes - go straight to OR with perioperative surveillance  Step 2 - Active Cardiac Conditions (Unstable Angina, Decompensated HF, Significant  Arrhytmias - Complete HB, Mobitz II, Symptomatic VT or SVT, Severe Aortic Stenosis - mean gradient > 40 mmHg, Valve area < 1.0 cm2):   No:   If Yes - Evaluate & Treat per ACC/AHA Guidelines  Step 3 -  Low Risk Surgery: No: Intermediate Risk  If Yes --> proceed to OR  If No --> Step 4  Step 4 - Functional Capacity >= 4 METS without symptoms: No: Limited by OA.  If Yes --> proceed to OR  If No --> Step 5  Step 5 --  Clinical Risk Factors (CRF)  - see above  1-2 or more CRFs: No:   If Yes -- assess Surgical Risk, --   (High Risk Non-cardiac), Intraabdominal or thoracic vascular surgery --> Proceed to OR, or consider testing if it will change management.  Intermediate Risk: Proceed to OR with HR control, or consider testing if it will change management  No CRFs: Yes - as indicated above  If Yes --> Proceed to OR   We  will follow-up the echocardiogram to ensure that this is normal. Pending results of the echocardiogram, we can likely sign off. We have discontinued Plavix.   Marykay Lex, M.D., M.S. Interventional Cardiologist   Pager # [phone] Phone # [phone] [address] [place], [place] [address]

## 2015-09-23 NOTE — Discharge Instructions (Signed)
Clopidogrel tablets °What is this medicine? °CLOPIDOGREL (kloh PID oh grel) helps to prevent blood clots. This medicine is used to prevent heart attack, stroke, or other vascular events in people who are at high risk. °This medicine may be used for other purposes; ask your health care provider or pharmacist if you have questions. °What should I tell my health care provider before I take this medicine? °They need to know if you have any of the following conditions: °-bleeding disorder °-bleeding in the brain °-planned surgery °-stomach or intestinal ulcers °-stroke or transient ischemic attack °-an unusual or allergic reaction to clopidogrel, other medicines, foods, dyes, or preservatives °-pregnant or trying to get pregnant °-breast-feeding °How should I use this medicine? °Take this medicine by mouth with a drink of water. Follow the directions on the prescription label. You may take this medicine with or without food. Take your medicine at regular intervals. Do not take your medicine more often than directed. °Talk to your pediatrician regarding the use of this medicine in children. Special care may be needed. °Overdosage: If you think you have taken too much of this medicine contact a poison control center or emergency room at once. °NOTE: This medicine is only for you. Do not share this medicine with others. °What if I miss a dose? °If you miss a dose, take it as soon as you can. If it is almost time for your next dose, take only that dose. Do not take double or extra doses. °What may interact with this medicine? °-aspirin °-blood thinners like cilostazol, enoxaparin, ticlopidine, and warfarin °-certain medicines for depression like citalopram, fluoxetine, and fluvoxamine °-certain medicines for fungal infections like ketoconazole, fluconazole, and voriconazole °-certain medicines for HIV infection like delavirdine, efavirenz, and etravirine °-certain medicines for seizures like felbamate, oxcarbazepine, and  phenytoin °-chloramphenicol °-fluvastatin °-isoniazid, INH °-medicines for inflammation like ibuprofen and naproxen °-modafinil °-nicardipine °-over-the counter supplements like echinacea, feverfew, fish oil, garlic, ginger, ginkgo, green tea, horse chestnut °-quinine °-stomach acid blockers like cimetidine, omeprazole, and esomeprazole °-tamoxifen °-tolbutamide °-topiramate °-torsemide °This list may not describe all possible interactions. Give your health care provider a list of all the medicines, herbs, non-prescription drugs, or dietary supplements you use. Also tell them if you smoke, drink alcohol, or use illegal drugs. Some items may interact with your medicine. °What should I watch for while using this medicine? °Visit your doctor or health care professional for regular check ups. Do not stop taking your medicine unless your doctor tells you to. °Notify your doctor or health care professional and seek emergency treatment if you develop breathing problems; changes in vision; chest pain; severe, sudden headache; pain, swelling, warmth in the leg; trouble speaking; sudden numbness or weakness of the face, arm or leg. These can be signs that your condition has gotten worse. °If you are going to have surgery or dental work, tell your doctor or health care professional that you are taking this medicine. °Certain genetic factors may reduce the effect of this medicine. Your doctor may use genetic tests to determine treatment. °What side effects may I notice from receiving this medicine? °Side effects that you should report to your doctor or health care professional as soon as possible: °-allergic reactions like skin rash, itching or hives, swelling of the face, lips, or tongue °-breathing problems °-changes in vision °-fever °-signs and symptoms of bleeding such as bloody or black, tarry stools; red or dark-brown urine; spitting up blood or brown material that looks like coffee grounds; red spots on   the skin;  unusual bruising or bleeding from the eye, gums, or nose °-sudden weakness °-unusual bleeding or bruising °Side effects that usually do not require medical attention (report to your doctor or health care professional if they continue or are bothersome): °-constipation or diarrhea °-headache °-pain in back or joints °-stomach upset °This list may not describe all possible side effects. Call your doctor for medical advice about side effects. You may report side effects to FDA at 1-800-FDA-1088. °Where should I keep my medicine? °Keep out of the reach of children. °Store at room temperature of 59 to 86 degrees F (15 to 30 degrees C). Throw away any unused medicine after the expiration date. °NOTE: This sheet is a summary. It may not cover all possible information. If you have questions about this medicine, talk to your doctor, pharmacist, or health care provider. °  °© 2016, [employer]. (2012-11-19 16:34:37) ° °

## 2015-09-24 DIAGNOSIS — I272 Other secondary pulmonary hypertension: Secondary | ICD-10-CM

## 2015-09-24 DIAGNOSIS — Z01810 Encounter for preprocedural cardiovascular examination: Secondary | ICD-10-CM | POA: Diagnosis not present

## 2015-09-24 DIAGNOSIS — I2510 Atherosclerotic heart disease of native coronary artery without angina pectoris: Secondary | ICD-10-CM | POA: Diagnosis not present

## 2015-09-24 DIAGNOSIS — I10 Essential (primary) hypertension: Secondary | ICD-10-CM | POA: Diagnosis not present

## 2015-09-24 LAB — BASIC METABOLIC PANEL
ANION GAP: 9 (ref 5–15)
BUN: 10 mg/dL (ref 6–20)
CALCIUM: 9.1 mg/dL (ref 8.9–10.3)
CO2: 22 mmol/L (ref 22–32)
CREATININE: 0.71 mg/dL (ref 0.44–1.00)
Chloride: 103 mmol/L (ref 101–111)
GFR calc Af Amer: >60 mL/min (ref >60)
GFR calc non Af Amer: >60 mL/min (ref >60)
GLUCOSE: 96 mg/dL (ref 65–99)
Potassium: 3.7 mmol/L (ref 3.5–5.1)
Sodium: 134 mmol/L — ABNORMAL LOW (ref 135–145)

## 2015-09-24 LAB — CBC
HCT: 33.4 % — ABNORMAL LOW (ref 36.0–46.0)
HEMOGLOBIN: 11.5 g/dL — AB (ref 12.0–15.0)
MCH: 29.3 pg (ref 26.0–34.0)
MCHC: 34.4 g/dL (ref 30.0–36.0)
MCV: 85.2 fL (ref 78.0–100.0)
Platelets: 337 10*3/uL (ref 150–400)
RBC: 3.92 MIL/uL (ref 3.87–5.11)
RDW: 14.5 % (ref 11.5–15.5)
WBC: 11.7 10*3/uL — ABNORMAL HIGH (ref 4.0–10.5)

## 2015-09-24 LAB — OSMOLALITY, URINE
OSMOLALITY UR: 281 mosm/kg — AB (ref 300–900)
OSMOLALITY UR: 508 mosm/kg (ref 300–900)

## 2015-09-24 MED ORDER — HYDRALAZINE HCL 20 MG/ML IJ SOLN
5.0000 mg | INTRAMUSCULAR | Status: DC | PRN
Start: 2015-09-24 — End: 2015-09-24
  Administered 2015-09-24: 5 mg via INTRAVENOUS
  Filled 2015-09-24: qty 1

## 2015-09-24 NOTE — Progress Notes (Signed)
See d/c summary 09/23/2015. NO changes needed, pt stable for d/c  Debbora Presto, MD  [facility] Pager [phone]  If 7PM-7AM, please contact night-coverage [email or web address] Password [email or web address]

## 2015-09-24 NOTE — Progress Notes (Signed)
Called report to [employer]. Maeola Harman

## 2015-09-24 NOTE — Care Management Note (Signed)
Case Management Note  Patient Details  Name: Kymia Simi MRN: [id number] Date of Birth: 1928/04/08  Subjective/Objective:    Hponatremia, fall fx hip                Action/Plan: plan to [facility] to [facility]   Expected Discharge Date:   (UNKNOWN)               Expected Discharge Plan:  Skilled Nursing Facility  In-House Referral:  Clinical Social Work  Discharge planning Services  CM Consult  Post Acute Care Choice:    Choice offered to:  Patient  DME Arranged:    DME Agency:     HH Arranged:    HH Agency:     Status of Service:  Completed, signed off  Medicare Important Message Given:    Date Medicare IM Given:    Medicare IM give by:    Date Additional Medicare IM Given:    Additional Medicare Important Message give by:     If discussed at Long Length of Stay Meetings, dates discussed:    Additional CommentsGeni Bers, RN 09/24/2015, 12:00 PM

## 2015-09-24 NOTE — Progress Notes (Addendum)
Subjective: No CP or SOB.  Objective: Vital signs in last 24 hours: Temp:  [97.4 F (36.3 C)-98.2 F (36.8 C)] 98.2 F (36.8 C) (02/17 1049) Pulse Rate:  [67-72] 72 (02/17 1049) Resp:  [18] 18 (02/17 0422) BP: (155-182)/(73-90) 182/73 mmHg (02/17 1049) SpO2:  [96 %-100 %] 98 % (02/17 1049) Last BM Date: 09/21/15  Intake/Output from previous day: 02/16 0701 - 02/17 0700 In: 2740 [P.O.:360; I.V.:2380] Out: -  Intake/Output this shift:    Medications Scheduled Meds: . aspirin EC  81 mg Oral Daily  . atorvastatin  20 mg Oral Daily  . docusate sodium  100 mg Oral BID  . enoxaparin (LOVENOX) injection  40 mg Subcutaneous Q24H  . feeding supplement (ENSURE ENLIVE)  237 mL Oral Q24H  . gabapentin  100 mg Oral BID  . lisinopril  20 mg Oral Daily  . metoprolol succinate  25 mg Oral Daily  . mirtazapine  15 mg Oral QHS  . sodium chloride flush  3 mL Intravenous Q12H   Continuous Infusions:  PRN Meds:.acetaminophen **OR** acetaminophen, hydrALAZINE, levalbuterol, ondansetron **OR** ondansetron (ZOFRAN) IV, oxyCODONE  PE: General appearance: alert, cooperative and no distress Lungs: clear to auscultation bilaterally Heart: regular rate and rhythm, S1, S2 normal, no murmur, click, rub or gallop Extremities: No LEE Pulses: 2+ and symmetric Skin: Warm and dry Neurologic: Grossly normal  Lab Results:   Recent Labs  09/22/15 0949 09/23/15 0441 09/24/15 0449  WBC 9.6 8.3 11.7*  HGB 11.9* 10.7* 11.5*  HCT 35.1* 31.9* 33.4*  PLT 352 317 337   BMET  Recent Labs  09/22/15 0949 09/23/15 0441 09/24/15 0449  NA 126* 132* 134*  K 3.5 3.4* 3.7  CL 92* 101 103  CO2 GLUCOSE 116* 97 96  BUN CREATININE 0.93 0.83 0.71  CALCIUM 9.4 9.0 9.1    Assessment/Plan   Principal Problem:   Hyponatremia Active Problems:   CAD - MI/PCI in 2002   Preoperative cardiovascular examination   Pulmonary hypertension, moderate to severe (HCC)   Essential  hypertension   Sinus bradycardia   Hyperlipemia   HTN (hypertension)   Hyperlipidemia   Neuropathic pain   Hyponatremia with excess extracellular fluid volume   Acute hyponatremia   AVN (avascular necrosis of bone) (HCC)   Abnormal TSH   Hypokalemia  Echocardiogram shows normal systolic function with G2DD(which I would expect in a sedentary 82 YO female).  She does have severe pulmonary HTN(peak PA pressure ). RV sys function mod to severely reduced.   MD to review.   Telemetry at 0926hrs yesterday looks like she may have had a few seconds of 2nd degree AVB type 1.  Nothing since.  May have just been a few junctional escape beats.    LOS: 2 days    HAGER, BRYAN PA-C 09/24/2015 2:03 PM   I have seen, examined and evaluated the patient this PM along with Mr. Leron Croak, [place].  After reviewing all the available data and chart,  I agree with his findings, examination as well as impression recommendations.  Continues to feel well today. No chest tightness, pressure or dyspnea.  I talked to her in length, prior to her bothering her, she was able to walk around without any significant dyspnea. Her tachycardia does suggest evidence of elevated pulmonary pressures. Despite this she does not have any significant symptoms of dyspnea, and does not have significant edema.  Unfortunately the evidence of RV dysfunction would  add one risk factor to her pre-op Criteria, but would not warrant any further evaluation pre-op.  She was still be considered LOW risk for her operation, would likely warrant more aggressive monitoring as far as hemodynamics and volume status. No current signs of heart failure.   One thing to for anesthesia to consider would be to place a right heart catheter for her operation for a severe monitoring.  If these numbers could be recorded: RA, RV and PA/PCWP pressures we could potentially confirm or deny the accuracy of  The echocardiogram results.    Otherwise I don't think is  a new evaluation and needs to be performed.   we will be available if necessary peri-/postop. I understand she is supposed to be transferred to a skilled nursing facility over the weekend and return on next week for her surgery to allow for Plavix washout.     Marykay Lex, M.D., M.S. Interventional Cardiologist   Pager # [phone] Phone # [phone] [address] [place], [place] [address]

## 2015-09-24 NOTE — Progress Notes (Signed)
Pt states feel like I can have BM, small smear noted, hard stool near rectal vault area. Gently disimpacted pt--lg formed hard to soft brown stool noted. Pt states relief and was able to go own her own thereafter. Will cont to monitor. SRP, RN

## 2015-09-24 NOTE — Clinical Social Work Placement (Signed)
Patient has a bed at [facility]. Anticipating possible discharge today. CSW will follow-up.    Lincoln Maxin, LCSW [facility] Clinical Social Worker cell #: [phone]     CLINICAL SOCIAL WORK PLACEMENT  NOTE  Date:  09/24/2015  Patient Details  Name: Raylen Ken MRN: [id number] Date of Birth: Dec 12, 1927  Clinical Social Work is seeking post-discharge placement for this patient at the Skilled  Nursing Facility level of care (*CSW will initial, date and re-position this form in  chart as items are completed):  Yes   Patient/family provided with [facility] Clinical Social Work Department's list of facilities offering this level of care within the geographic area requested by the patient (or if unable, by the patient's family).  Yes   Patient/family informed of their freedom to choose among providers that offer the needed level of care, that participate in Medicare, Medicaid or managed care program needed by the patient, have an available bed and are willing to accept the patient.  Yes   Patient/family informed of [facility]'s ownership interest in [facility] and [facility], as well as of the fact that they are under no obligation to receive care at these facilities.  PASRR submitted to EDS on       PASRR number received on       Existing PASRR number confirmed on 09/23/15     FL2 transmitted to all facilities in geographic area requested by pt/family on 09/23/15     FL2 transmitted to all facilities within larger geographic area on       Patient informed that his/her managed care company has contracts with or will negotiate with certain facilities, including the following:        Yes   Patient/family informed of bed offers received.  Patient chooses bed at [facility]     Physician recommends and patient chooses bed at      Patient to be transferred to [facility] on  .  Patient to be transferred to facility by       Patient  family notified on   of transfer.  Name of family member notified:        PHYSICIAN       Additional Comment:    _______________________________________________ Arlyss Repress, LCSW 09/24/2015, 8:53 AM

## 2015-09-24 NOTE — Progress Notes (Signed)
PT Cancellation Note  Patient Details Name: [name] [name] MRN: [id number] DOB: August 06, 1928   Cancelled Treatment:    Reason Eval/Treat Not Completed: PT screened, no needs identified, will sign off  Per RN, no PT needs prior to d/c.  Also checked with CSW who reports no PT evaluation needed prior to d/c.   Appears plan is for d/c to SNF to await hip surgery.   Tayleigh Wetherell,KATHrine E 09/24/2015, 1:57 PM Zenovia Jarred, PT, DPT 09/24/2015 Pager: [phone]

## 2015-09-24 NOTE — Clinical Social Work Placement (Signed)
Patient is set to discharge to [facility] today. Patient & niece, Bonita Quin aware. Discharge packet given to RN, Annabelle Harman. [facility] called for transport.     Lincoln Maxin, LCSW [facility] Clinical Social Worker cell #: [phone]    CLINICAL SOCIAL WORK PLACEMENT  NOTE  Date:  09/24/2015  Patient Details  Name: [name] [name] MRN: [id number] Date of Birth: Aug 08, 1927  Clinical Social Work is seeking post-discharge placement for this patient at the Skilled  Nursing Facility level of care (*CSW will initial, date and re-position this form in  chart as items are completed):  Yes   Patient/family provided with [facility] Clinical Social Work Department's list of facilities offering this level of care within the geographic area requested by the patient (or if unable, by the patient's family).  Yes   Patient/family informed of their freedom to choose among providers that offer the needed level of care, that participate in Medicare, Medicaid or managed care program needed by the patient, have an available bed and are willing to accept the patient.  Yes   Patient/family informed of [facility]'s ownership interest in [facility] and [facility], as well as of the fact that they are under no obligation to receive care at these facilities.  PASRR submitted to EDS on       PASRR number received on       Existing PASRR number confirmed on 09/23/15     FL2 transmitted to all facilities in geographic area requested by pt/family on 09/23/15     FL2 transmitted to all facilities within larger geographic area on       Patient informed that his/her managed care company has contracts with or will negotiate with certain facilities, including the following:        Yes   Patient/family informed of bed offers received.  Patient chooses bed at [facility]     Physician recommends and patient chooses bed at      Patient to be transferred to [facility] on 09/24/15.  Patient to  be transferred to facility by [facility]     Patient family notified on 09/24/15 of transfer.  Name of family member notified:  patient's niece, Bonita Quin via phone     PHYSICIAN       Additional Comment:    _______________________________________________ Arlyss Repress, LCSW 09/24/2015, 2:46 PM

## 2015-11-25 ENCOUNTER — Ambulatory Visit: Payer: Medicare Other | Admitting: Podiatry

## 2016-01-06 ENCOUNTER — Emergency Department (HOSPITAL_COMMUNITY)
Admission: EM | Admit: 2016-01-06 | Discharge: 2016-01-06 | Disposition: A | Discharge status: Home / Self Care | Payer: Medicare Other | Attending: Emergency Medicine | Admitting: Emergency Medicine

## 2016-01-06 ENCOUNTER — Emergency Department (HOSPITAL_COMMUNITY): Payer: Medicare Other

## 2016-01-06 DIAGNOSIS — I2510 Atherosclerotic heart disease of native coronary artery without angina pectoris: Secondary | ICD-10-CM | POA: Insufficient documentation

## 2016-01-06 DIAGNOSIS — I160 Hypertensive urgency: Secondary | ICD-10-CM | POA: Insufficient documentation

## 2016-01-06 DIAGNOSIS — R531 Weakness: Secondary | ICD-10-CM | POA: Diagnosis present

## 2016-01-06 DIAGNOSIS — Z79899 Other long term (current) drug therapy: Secondary | ICD-10-CM | POA: Insufficient documentation

## 2016-01-06 DIAGNOSIS — Z7982 Long term (current) use of aspirin: Secondary | ICD-10-CM | POA: Diagnosis not present

## 2016-01-06 DIAGNOSIS — E785 Hyperlipidemia, unspecified: Secondary | ICD-10-CM | POA: Diagnosis not present

## 2016-01-06 DIAGNOSIS — R51 Headache: Secondary | ICD-10-CM | POA: Diagnosis not present

## 2016-01-06 DIAGNOSIS — R519 Headache, unspecified: Secondary | ICD-10-CM

## 2016-01-06 DIAGNOSIS — I252 Old myocardial infarction: Secondary | ICD-10-CM | POA: Insufficient documentation

## 2016-01-06 LAB — I-STAT CHEM 8, ED
BUN: 16 mg/dL (ref 6–20)
CALCIUM ION: 1.31 mmol/L — AB (ref 1.13–1.30)
Chloride: 103 mmol/L (ref 101–111)
Creatinine, Ser: 0.8 mg/dL (ref 0.44–1.00)
Glucose, Bld: 93 mg/dL (ref 65–99)
HEMATOCRIT: 39 % (ref 36.0–46.0)
HEMOGLOBIN: 13.3 g/dL (ref 12.0–15.0)
Potassium: 3.7 mmol/L (ref 3.5–5.1)
Sodium: 143 mmol/L (ref 135–145)
TCO2: 26 mmol/L (ref 0–100)

## 2016-01-06 LAB — COMPREHENSIVE METABOLIC PANEL
ALBUMIN: 3.9 g/dL (ref 3.5–5.0)
ALK PHOS: 62 U/L (ref 38–126)
ALT: 10 U/L — AB (ref 14–54)
AST: 15 U/L (ref 15–41)
Anion gap: 6 (ref 5–15)
BILIRUBIN TOTAL: 0.6 mg/dL (ref 0.3–1.2)
BUN: 14 mg/dL (ref 6–20)
CALCIUM: 10.1 mg/dL (ref 8.9–10.3)
CO2: 27 mmol/L (ref 22–32)
CREATININE: 0.78 mg/dL (ref 0.44–1.00)
Chloride: 107 mmol/L (ref 101–111)
GFR calc non Af Amer: >60 mL/min (ref >60)
GLUCOSE: 98 mg/dL (ref 65–99)
Potassium: 3.7 mmol/L (ref 3.5–5.1)
SODIUM: 140 mmol/L (ref 135–145)
TOTAL PROTEIN: 6.4 g/dL — AB (ref 6.5–8.1)

## 2016-01-06 LAB — URINALYSIS, ROUTINE W REFLEX MICROSCOPIC
BILIRUBIN URINE: NEGATIVE
Glucose, UA: NEGATIVE mg/dL
Hgb urine dipstick: NEGATIVE
KETONES UR: NEGATIVE mg/dL
LEUKOCYTES UA: NEGATIVE
NITRITE: NEGATIVE
PROTEIN: NEGATIVE mg/dL
Specific Gravity, Urine: 1.016 (ref 1.005–1.030)
pH: 5.5 (ref 5.0–8.0)

## 2016-01-06 LAB — CBG MONITORING, ED: Glucose-Capillary: 92 mg/dL (ref 65–99)

## 2016-01-06 LAB — DIFFERENTIAL
Basophils Absolute: 0.1 10*3/uL (ref 0.0–0.1)
Basophils Relative: 1 %
EOS PCT: 4 %
Eosinophils Absolute: 0.3 10*3/uL (ref 0.0–0.7)
LYMPHS ABS: 1.6 10*3/uL (ref 0.7–4.0)
LYMPHS PCT: 23 %
MONO ABS: 0.6 10*3/uL (ref 0.1–1.0)
Monocytes Relative: 8 %
NEUTROS ABS: 4.4 10*3/uL (ref 1.7–7.7)
Neutrophils Relative %: 64 %

## 2016-01-06 LAB — I-STAT TROPONIN, ED: Troponin i, poc: 0.02 ng/mL (ref 0.00–0.08)

## 2016-01-06 LAB — CBC
HEMATOCRIT: 39.9 % (ref 36.0–46.0)
HEMOGLOBIN: 12.3 g/dL (ref 12.0–15.0)
MCH: 28.6 pg (ref 26.0–34.0)
MCHC: 30.8 g/dL (ref 30.0–36.0)
MCV: 92.8 fL (ref 78.0–100.0)
PLATELETS: 257 10*3/uL (ref 150–400)
RBC: 4.3 MIL/uL (ref 3.87–5.11)
RDW: 14.3 % (ref 11.5–15.5)
WBC: 6.9 10*3/uL (ref 4.0–10.5)

## 2016-01-06 LAB — PROTIME-INR
INR: 1.19 (ref 0.00–1.49)
PROTHROMBIN TIME: 15.2 s (ref 11.6–15.2)

## 2016-01-06 LAB — APTT: aPTT: 30 seconds (ref 24–37)

## 2016-01-06 MED ORDER — METOPROLOL TARTRATE 25 MG PO TABS
12.5000 mg | ORAL_TABLET | Freq: Once | ORAL | Status: AC
  Administered 2016-01-06: 12.5 mg via ORAL
  Filled 2016-01-06: qty 1

## 2016-01-06 NOTE — ED Provider Notes (Signed)
7:21 PM d/w patient and family- they do not want to wait any longer for psych evaluation.  Son states he mentioned her saying she hears people singing because he thought it may be related to her headaches.  He and patient are not concerned about the hallucinations.  He states she is at her mental baseline, is lucid and is tired of waiting here in the ED.  Will discharge back to nursing facility  Jerelyn ScottMartha Linker, MD 01/06/16 [phone]

## 2016-01-06 NOTE — ED Notes (Signed)
CHECKED CBG 92, RN NICOLE INFORMED

## 2016-01-06 NOTE — ED Notes (Signed)
Pt denies SI/HI/AVH, pt wishes to cancel TTS and go back to nursing home. MD at bedside.

## 2016-01-06 NOTE — Discharge Instructions (Signed)
We saw you in the ER for the R sided headache and possible weakness on the R side. All the results in the ER are normal, labs and imaging. We are not sure what is causing your symptoms. The workup in the ER is not complete, and is limited to screening for life threatening and emergent conditions only, so please see a primary care doctor for further evaluation.  CT is below: FINDINGS: The ventricular system is somewhat prominent as are the cortical sulci, indicative of diffuse atrophy. Moderate small vessel ischemic changes noted throughout the periventricular white matter. No hemorrhage, mass lesion, or acute infarction is seen. On bone window images, no calvarial abnormality is noted. The paranasal sinuses that are visualized are clear.  Please return to the ER if your symptoms worsen; you have increased pain, fevers, chills, inability to keep any medications down, confusion. Otherwise see the outpatient doctor as requested.

## 2016-01-06 NOTE — ED Notes (Signed)
[facility] in [facility] via [facility], per report pt had onset of R sided arm weakness today @ 11am that lasted a few minutes & then resolved, pt denies weakness upon arrival to ED, per report staff reported pt had similar episode last week with an episode of unresponsiveness, R sided arm & leg weakness post a HA on the R side of the head-the pt was not sent out for eval at that time per EMS, pt A&O x4 upon arrival to ED, -facial droop, -slurred speech, pt has baseline R leg weakness from a hip injury

## 2016-01-06 NOTE — ED Provider Notes (Addendum)
CSN: [id number]     Arrival date & time 01/06/16  1212 History   First MD Initiated Contact with Patient 01/06/16 1249     Chief Complaint  Patient presents with  . Extremity Weakness     (Consider location/radiation/quality/duration/timing/severity/associated sxs/prior Treatment) HPI Comments: Pt comes in with cc of weakness. Pt has hx of CAD, Sciatica. She resides at the nursing home. Louann Liv, she was complaining of headache above her R eye brow. No vision complains with that. She had the pain pretty much all day, it was moderately severe. She woke up w/o pain, but reports that the RN at the SNF wanted her to get checked out.. Nursing note reports that pt c/o R sided weakness as well. Pt denies ever complaining of weakness to the R side. She is aox3. Pt denies nausea, vomiting, visual complains, seizures, altered mental status, loss of consciousness, new weakness, or numbness. She doesn't ambulate.  While patient was updated and we were preparing to d/c, nephew comes out and reports that the patient has been hearing singing in her ears for the past 1 week, off and on. She denies any new meds.  ROS 10 Systems reviewed and are negative for acute change except as noted in the HPI.      The history is provided by the patient.    Past Medical History  Diagnosis Date  . CAD (coronary artery disease)     a. remote MI/stenting in [place] in 2002. b. Nuc 2009: prior inferior MI but no evidence of ischemia, EF 53% with akinesis of inferior wall.  [name] HTN (hypertension)   . Hyperlipidemia   . MI (myocardial infarction) (HCC) 2002  . Arthritis   . Neuropathy (HCC)   . Hip fracture (HCC)     a. s/p prior ORIF.  [name] Sciatica   . Hearing loss     a. bilat hearing aids.   Past Surgical History  Procedure Laterality Date  . Dilation and curettage of uterus  1960  . Hystrectomy    . Cataract extraction  1998    RIGHT AND LEFT EYE  . Cholecystectomy  2000  . Refractive surgery  2002  . Ptca  2002   . Abdominal hysterectomy    . Compression hip screw  12/16/2011    Procedure: COMPRESSION HIP;  Surgeon: Kerrin Champagne, MD;  Location: [facility] ORS;  Service: Orthopedics;  Laterality: Right;   Family History  Problem Relation Age of Onset  . Hypertension Father     DECEASED  . Cancer Sister 61    BREAST/BONE CA  . Pneumonia Mother 39  . Aneurysm Brother 87    DECEASED   Social History  Substance Use Topics  . Smoking status: Never Smoker   . Smokeless tobacco: Never Used  . Alcohol Use: No   OB History    No data available     Review of Systems    Allergies  Penicillins  Home Medications   Prior to Admission medications   Medication Sig Start Date End Date Taking? Authorizing Provider  acetaminophen (TYLENOL) 500 MG tablet Take 1,000 mg by mouth every 6 (six) hours as needed for mild pain.   Yes Historical Provider, MD  aspirin EC 81 MG tablet Take 81 mg by mouth daily.   Yes Historical Provider, MD  atorvastatin (LIPITOR) 20 MG tablet TAKE 1 TABLET DAILY 05/24/15  Yes Kristian Covey, MD  diphenoxylate-atropine (LOMOTIL) 2.5-0.025 MG tablet Take 1 tablet by mouth 4 (four) times daily  as needed for diarrhea or loose stools.   Yes Historical Provider, MD  gabapentin (NEURONTIN) 100 MG capsule TAKE 1 CAPSULE TWICE A DAY Patient taking differently: TAKE 100MG  BY MOUTH TWICE DAILY 02/24/15  Yes Kristian CoveyBruce W Burchette, MD  lisinopril (PRINIVIL,ZESTRIL) 20 MG tablet TAKE 1 TABLET DAILY 05/24/15  Yes Kristian CoveyBruce W Burchette, MD  metoprolol succinate (TOPROL-XL) 25 MG 24 hr tablet Take 1 tablet (25 mg total) by mouth daily. Take with or immediately following a meal. 09/23/15  Yes Dorothea OgleIskra M Myers, MD  mirtazapine (REMERON SOL-TAB) 15 MG disintegrating tablet PLACE 1 TABLET ON TONGUE AT BEDTIME Patient taking differently: PLACE 1 TABLET=7.5MG  ON TONGUE AT BEDTIME 09/23/15  Yes Bruce Colletta MarylandW Burchette, MD   BP 190/81 mmHg  Pulse 70  Temp(Src) 98.3 F (36.8 C) (Oral)  Resp 21  SpO2 94% Physical Exam   Constitutional: She is oriented to person, place, and time. She appears well-developed.  HENT:  Head: Normocephalic and atraumatic.  Eyes: Conjunctivae and EOM are normal. Pupils are equal, round, and reactive to light.  Neck: Normal range of motion. Neck supple.  Cardiovascular: Normal rate, regular rhythm and normal heart sounds.   Pulmonary/Chest: Effort normal and breath sounds normal. No respiratory distress.  Abdominal: Soft. Bowel sounds are normal. She exhibits no distension. There is no tenderness. There is no rebound and no guarding.  Neurological: She is alert and oriented to person, place, and time. No cranial nerve deficit. Coordination normal.  Skin: Skin is warm and dry.  Nursing note and vitals reviewed.   ED Course  Procedures (including critical care time) Labs Review Labs Reviewed  COMPREHENSIVE METABOLIC PANEL - Abnormal; Notable for the following:    Total Protein 6.4 (*)    ALT 10 (*)    All other components within normal limits  I-STAT CHEM 8, ED - Abnormal; Notable for the following:    Calcium, Ion 1.31 (*)    All other components within normal limits  PROTIME-INR  APTT  CBC  DIFFERENTIAL  URINALYSIS, ROUTINE W REFLEX MICROSCOPIC (NOT AT [facility])  I-STAT TROPOININ, ED  CBG MONITORING, ED    Imaging Review Ct Head Wo Contrast  01/06/2016  CLINICAL DATA:  Recent bout of aphasia and difficulty swallowing, right eye pain EXAM: CT HEAD WITHOUT CONTRAST TECHNIQUE: Contiguous axial images were obtained from the base of the skull through the vertex without intravenous contrast. COMPARISON:  None. FINDINGS: The ventricular system is somewhat prominent as are the cortical sulci, indicative of diffuse atrophy. Moderate small vessel ischemic changes noted throughout the periventricular white matter. No hemorrhage, mass lesion, or acute infarction is seen. On bone window images, no calvarial abnormality is noted. The paranasal sinuses that are visualized are clear.  IMPRESSION: Atrophy and small vessel ischemic change. No acute intracranial abnormality. Electronically Signed   By: Dwyane DeePaul  Barry M.D.   On: 01/06/2016 13:25   I have personally reviewed and evaluated these images and lab results as part of my medical decision-making.   EKG Interpretation   Date/Time:  Thursday January 06 2016 12:24:27 EDT Ventricular Rate:  70 PR Interval:  182 QRS Duration: 93 QT Interval:  430 QTC Calculation: 464 R Axis:   -28 Text Interpretation:  Sinus rhythm Probable LVH with secondary repol abnrm  No acute changes No significant change since last tracing Confirmed by  Rhunette CroftNANAVATI, MD, Odis Turck [phone]([id number]) on 01/06/2016 1:33:51 PM      MDM   Final diagnoses:  Right-sided headache  Hypertensive urgency    Pt  comes in with cc of R sided headache and weakness. Later on i was reported that patient has been hearing voices.  DDX: Stroke/TIA Hallucinations / Delerium Medication side effects  CT head is normal. Neuro exam is non focal (chronic LUQ and RLE weakness). Labs are reassuring. BP is high - but currently she has no hallucinations, no headaches.  Derwood Kaplan, MD 01/06/16 1450  @3 :10: Spoke with Neurology - Dr. Dian Queen. He recommends psych consult for new auditory / visual hallucination in light of normal neuro exam and normal CT/ labs. Can be discharged once psych clears.    Derwood Kaplan, MD 01/06/16 1517

## 2016-01-06 NOTE — ED Notes (Signed)
TTS placed at bedside.  

## 2016-02-10 ENCOUNTER — Telehealth: Payer: Self-pay

## 2016-02-10 NOTE — Telephone Encounter (Signed)
She is my patient

## 2016-02-10 NOTE — Telephone Encounter (Signed)
This pt is on my [employer] list and wanted to clarify if she is or is not your patient.  Can you take a few moments and clarify this for me? I did not see any notes to indicate that she has been dismissed or has transferred care.

## 2016-02-11 NOTE — Telephone Encounter (Signed)
Added PCP to reflect below. Pt is not a candidate this year for AWV.

## 2016-02-15 ENCOUNTER — Ambulatory Visit: Payer: Medicare Other | Admitting: Family Medicine

## 2016-06-07 ENCOUNTER — Ambulatory Visit (INDEPENDENT_AMBULATORY_CARE_PROVIDER_SITE_OTHER): Payer: Medicare Other

## 2016-06-07 ENCOUNTER — Ambulatory Visit (INDEPENDENT_AMBULATORY_CARE_PROVIDER_SITE_OTHER): Payer: Medicare Other | Admitting: Physician Assistant

## 2016-06-07 DIAGNOSIS — M25551 Pain in right hip: Secondary | ICD-10-CM

## 2016-06-07 DIAGNOSIS — M1711 Unilateral primary osteoarthritis, right knee: Secondary | ICD-10-CM | POA: Diagnosis not present

## 2016-06-07 DIAGNOSIS — G8929 Other chronic pain: Secondary | ICD-10-CM | POA: Diagnosis not present

## 2016-06-07 DIAGNOSIS — M25512 Pain in left shoulder: Secondary | ICD-10-CM

## 2016-06-07 NOTE — Progress Notes (Signed)
Office Visit Note   Patient: [name] [name]           Date of Birth: [birth year]           MRN: [id number] Visit Date: 06/07/2016              Requested by: Kristian CoveyBruce W Burchette, MD [address] [place], [place] [id number] PCP: Kristian CoveyBURCHETTE,BRUCE W, MD   Assessment & Plan: Visit Diagnoses:  1. Pain in right hip   2. Arthritis of right knee   3. Chronic left shoulder pain     Plan: Follow-up 3 months for injection of right knee. Prescriptions given for a wheelchair today.  Follow-Up Instructions: Return in about 3 months (around 09/07/2016) for knee injection.   Orders:  Orders Placed This Encounter  Procedures  . XR HIP UNILAT W OR W/O PELVIS 2-3 VIEWS RIGHT   No orders of the defined types were placed in this encounter.     Procedures: No procedures performed   Clinical Data: No additional findings.   Subjective: Chief Complaint  Patient presents with  . Left Shoulder - Pain, Follow-up  . Right Hip - Pain, Follow-up  . Right Knee - Pain, Follow-up    HPI: Patient comes in today for follow up for left shoulder, right knee,  right hip pain. Last time she was seen in  May 10,2017. Walks with a walker very min. Today she ambulates with wheelchair (wheelchair comes from transportation). Would like an  Rx to get her own wheelchair. Left shoulder pain comes and goes. Doing physical therapy. Takes Tylenol. Right knee is doing bad. States she would like an injection today. She is having cracking, popping, locking. Right hip pain, still having pain. Worse pain, the month of October. She has known severe arthritis of her right knee, right hip and hardware failure from right hip surgery    Review of Systems   Objective: Vital Signs: There were no vitals taken for this visit.  Physical Exam  Seated wheelchair in no acute distress.  Ortho Exam  Right hip with decreased range of motion and pain with motion. She has swelling of the right knee and lacks about 15-20 of full  extension flexion to 90. Local tenderness. Knee no effusion no abnormal warmth.  Specialty Comments:  No specialty comments available.  Imaging: No results found.   PMFS History: Patient Active Problem List   Diagnosis Date Noted  . Pulmonary hypertension, moderate to severe 09/24/2015  . AVN (avascular necrosis of bone) (HCC) 09/23/2015  . Abnormal TSH 09/23/2015  . Hypokalemia 09/23/2015  . Sinus bradycardia 09/23/2015  . Preoperative cardiovascular examination 09/23/2015  . Hyponatremia 09/22/2015  . Acute hyponatremia 09/22/2015  . Fall   . Osteoarthritis, knee 08/29/2013  . Postoperative anemia due to acute blood loss 12/18/2011    Class: Acute  . Hyponatremia with excess extracellular fluid volume 12/18/2011    Class: Acute  . Hip fracture, intertrochanteric, right, closed, initial encounter 12/15/2011    Class: Acute  . Neuropathic pain 10/19/2011  . HTN (hypertension)   . Hyperlipidemia   . VITAMIN D DEFICIENCY 04/19/2010  . Hyperlipemia 04/19/2010  . 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 Essential hypertension 04/19/2010  . CAD - MI/PCI in 2002 05/23/2001   Past Medical History:  Diagnosis Date  . Arthritis   . CAD (coronary artery disease)    a. [place] in [place]. b. Nuc 2009: prior inferior MI but no evidence of ischemia, EF 53% with akinesis  of inferior wall.  [name]. Hearing loss    a. bilat hearing aids.  . Hip fracture (HCC)    a. s/p prior ORIF.  [name]. HTN (hypertension)   . Hyperlipidemia   . MI (myocardial infarction) 2002  . Neuropathy (HCC)   . Sciatica     Family History  Problem Relation Age of Onset  . Hypertension Father     DECEASED  . Cancer Sister [phone]    BREAST/BONE CA  . Pneumonia Mother [phone]  . Aneurysm Brother [phone]    DECEASED    Past Surgical History:  Procedure Laterality Date  . ABDOMINAL HYSTERECTOMY    . CATARACT EXTRACTION  1998   RIGHT AND LEFT EYE  . CHOLECYSTECTOMY  2000  . COMPRESSION HIP SCREW  12/16/2011   Procedure:  COMPRESSION HIP;  Surgeon: Kerrin ChampagneJames E Nitka, MD;  Location: [facility];  Service: Orthopedics;  Laterality: Right;  . DILATION AND CURETTAGE OF UTERUS  1960  . HYSTRECTOMY    . PTCA  2002  . REFRACTIVE SURGERY  2002   Social History   Occupational History  . Not on file.   Social History Main Topics  . Smoking status: Never Smoker  . Smokeless tobacco: Never Used  . Alcohol use No  . Drug use: No  . Sexual activity: No

## 2016-09-11 ENCOUNTER — Encounter (INDEPENDENT_AMBULATORY_CARE_PROVIDER_SITE_OTHER): Payer: Self-pay

## 2016-09-11 ENCOUNTER — Encounter (INDEPENDENT_AMBULATORY_CARE_PROVIDER_SITE_OTHER): Payer: Self-pay | Admitting: Physician Assistant

## 2016-09-11 ENCOUNTER — Ambulatory Visit (INDEPENDENT_AMBULATORY_CARE_PROVIDER_SITE_OTHER): Payer: Medicare Other | Admitting: Physician Assistant

## 2016-09-11 DIAGNOSIS — M1711 Unilateral primary osteoarthritis, right knee: Secondary | ICD-10-CM

## 2016-09-11 MED ORDER — METHYLPREDNISOLONE ACETATE 40 MG/ML IJ SUSP
40.0000 mg | INTRAMUSCULAR | Status: AC | PRN
  Administered 2016-09-11: 40 mg via INTRA_ARTICULAR

## 2016-09-11 MED ORDER — LIDOCAINE HCL 1 % IJ SOLN
1.0000 mL | INTRAMUSCULAR | Status: AC | PRN
  Administered 2016-09-11: 1 mL

## 2016-09-11 NOTE — Progress Notes (Signed)
Office Visit Note   Patient: [name] [name]           Date of Birth: 1927/12/21           MRN: [id number] Visit Date: 09/11/2016              Requested by: Kristian Covey, MD [address] [place], [place] [address] PCP: Kristian Covey, MD   Assessment & Plan: Visit Diagnoses:  1. Primary osteoarthritis of right knee     Plan: Follow-up in 3 months for cortisone injection right knee and to see how her right hip pain is doing.  Follow-Up Instructions: Return in about 3 months (around 12/09/2016).   Orders:  Orders Placed This Encounter  Procedures  . Large Joint Injection/Arthrocentesis   No orders of the defined types were placed in this encounter.     Procedures: Large Joint Inj Date/Time: 09/11/2016 10:40 AM Performed by: Kirtland Bouchard Authorized by: Kirtland Bouchard   Consent Given by:  Patient Indications:  Pain Location:  Knee Site:  R knee Needle Size:  22 G Approach:  Anterolateral Ultrasound Guidance: No   Fluoroscopic Guidance: No   Medications:  1 mL lidocaine 1 %; 40 mg methylPREDNISolone acetate 40 MG/ML Aspiration Attempted: No   Aspirate amount (mL):  5 Aspirate:  Blood-tinged Patient tolerance:  Patient tolerated the procedure well with no immediate complications     Clinical Data: No additional findings.   Subjective: Chief Complaint  Patient presents with  . Right Knee - Follow-up  . Right Hip - Follow-up  . Follow-up    HPI [name] [name] returns today follow-up of her right knee and right hip. States the hip overall is has good and bad days. But no real change in overall pain again she has some hardware failure cutout of the right hip which we've been watching on radiographs. In regards to her right knee she is requesting cortisone injection in the knee that states she may have some swelling in knee. Has severe arthritis of the right knee. Review of Systems   Objective: Vital Signs: There were no vitals taken for this  visit.  Physical Exam  Ortho Exam I knee slight edema no real effusion. No abnormal warmth she has tenderness over the anterior medial joint line of knee. No instability valgus varus stressing. She lacks last few degrees of full extension and flexes beyond and 90 Specialty Comments:  No specialty comments available.  Imaging: No results found.   PMFS History: Patient Active Problem List   Diagnosis Date Noted  . Pulmonary hypertension, moderate to severe 09/24/2015  . AVN (avascular necrosis of bone) (HCC) 09/23/2015  . Abnormal TSH 09/23/2015  . Hypokalemia 09/23/2015  . Sinus bradycardia 09/23/2015  . Preoperative cardiovascular examination 09/23/2015  . Hyponatremia 09/22/2015  . Acute hyponatremia 09/22/2015  . Fall   . Osteoarthritis, knee 08/29/2013  . Postoperative anemia due to acute blood loss 12/18/2011    Class: Acute  . Hyponatremia with excess extracellular fluid volume 12/18/2011    Class: Acute  . Hip fracture, intertrochanteric, right, closed, initial encounter 12/15/2011    Class: Acute  . Neuropathic pain 10/19/2011  . HTN (hypertension)   . Hyperlipidemia   . VITAMIN D DEFICIENCY 04/19/2010  . Hyperlipemia 04/19/2010  . 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 Essential hypertension 04/19/2010  . CAD - MI/PCI in 2002 05/23/2001   Past Medical History:  Diagnosis Date  . Arthritis   . CAD (coronary artery disease)  a. remote MI[place]stenting in [place] in 2002. b. Nuc 2009: prior inferior MI but no evidence of ischemia, EF 53% with akinesis of inferior wall.  [name]. Hearing loss    a. bilat hearing aids.  . Hip fracture (HCC)    a. s/p prior ORIF.  [name]. HTN (hypertension)   . Hyperlipidemia   . MI (myocardial infarction) 2002  . Neuropathy (HCC)   . Sciatica     Family History  Problem Relation Age of Onset  . Hypertension Father     DECEASED  . Cancer Sister [phone]    BREAST/BONE CA  . Pneumonia Mother [phone]  . Aneurysm Brother [phone]    DECEASED    Past Surgical History:    Procedure Laterality Date  . ABDOMINAL HYSTERECTOMY    . CATARACT EXTRACTION  1998   RIGHT AND LEFT EYE  . CHOLECYSTECTOMY  2000  . COMPRESSION HIP SCREW  12/16/2011   Procedure: COMPRESSION HIP;  Surgeon: Kerrin ChampagneJames E Nitka, MD;  Location: [facility];  Service: Orthopedics;  Laterality: Right;  . DILATION AND CURETTAGE OF UTERUS  1960  . HYSTRECTOMY    . PTCA  2002  . REFRACTIVE SURGERY  2002   Social History   Occupational History  . Not on file.   Social History Main Topics  . Smoking status: Never Smoker  . Smokeless tobacco: Never Used  . Alcohol use No  . Drug use: No  . Sexual activity: No

## 2016-12-11 ENCOUNTER — Telehealth (INDEPENDENT_AMBULATORY_CARE_PROVIDER_SITE_OTHER): Payer: Self-pay | Admitting: Radiology

## 2016-12-11 ENCOUNTER — Encounter (INDEPENDENT_AMBULATORY_CARE_PROVIDER_SITE_OTHER): Payer: Self-pay

## 2016-12-11 ENCOUNTER — Encounter (INDEPENDENT_AMBULATORY_CARE_PROVIDER_SITE_OTHER): Payer: Self-pay | Admitting: Physician Assistant

## 2016-12-11 ENCOUNTER — Ambulatory Visit (INDEPENDENT_AMBULATORY_CARE_PROVIDER_SITE_OTHER): Payer: Medicare Other | Admitting: Physician Assistant

## 2016-12-11 DIAGNOSIS — M25551 Pain in right hip: Secondary | ICD-10-CM

## 2016-12-11 DIAGNOSIS — M1711 Unilateral primary osteoarthritis, right knee: Secondary | ICD-10-CM | POA: Diagnosis not present

## 2016-12-11 MED ORDER — LIDOCAINE HCL 1 % IJ SOLN
3.0000 mL | INTRAMUSCULAR | Status: AC | PRN
  Administered 2016-12-11: 3 mL

## 2016-12-11 MED ORDER — METHYLPREDNISOLONE ACETATE 40 MG/ML IJ SUSP
40.0000 mg | INTRAMUSCULAR | Status: AC | PRN
  Administered 2016-12-11: 40 mg via INTRA_ARTICULAR

## 2016-12-11 NOTE — Progress Notes (Signed)
Mrs. [name] [name] returns today requesting a cortisone injection in her right knee. She has known osteoarthritis of the right knee. Last injection was 09/11/2016. She had no new injury. She continues to have pain in her right hip status post right hip open reduction internal fixation 5 years ago now. She does bring a note from the therapist states that she is done well with a knee splint and was wondering if there was some type of brace that we can offer her that could help hopefully increase her ability to ambulate and also decrease with her pain.  Right hip she has excellent range of motion of the hip today without pain. Right knee she has no abnormal warmth erythema or effusion. No instability valgus varus stressing. Full extension flexion to beyond 90.  Plan hinged knee brace. We'll see her back in 3 months for possible cortisone injection in the right knee. Questions encouraged and answered.

## 2016-12-11 NOTE — Telephone Encounter (Signed)
Jae DireKate with [facility] called and states patient returned with a brace on her knee today. They are needing orders regarding the brace. I did advise what was said in today's office visit note, but they are requiring something that states how long she needs to wear it daily, when she needs to wear it, etc. They would like this faxed to them at [phone].  '  Call back numbers: [phone] RN desk                                  [phone] supervisors phone

## 2016-12-11 NOTE — Progress Notes (Signed)
   Procedure Note  Patient: [name] MoriHelen [name]             Date of Birth: April 02, 1928           MRN: [id number]             Visit Date: 12/11/2016  Procedures: Visit Diagnoses: Right knee osteoarthritis Large Joint Inj Date/Time: 12/11/2016 11:21 AM Performed by: Kirtland BouchardLARK, Ardine Iacovelli W Authorized by: Kirtland BouchardLARK, Ledia Hanford W   Consent Given by:  Patient Indications:  Pain Location:  Knee Site:  R knee Needle Size:  25 G Approach:  Anterolateral Ultrasound Guidance: No   Fluoroscopic Guidance: No   Medications:  40 mg methylPREDNISolone acetate 40 MG/ML; 3 mL lidocaine 1 % Aspiration Attempted: No   Patient tolerance:  Patient tolerated the procedure well with no immediate complications

## 2016-12-12 NOTE — Telephone Encounter (Signed)
Betsy ttok care of this yesterday

## 2016-12-12 NOTE — Telephone Encounter (Signed)
Note faxed.

## 2017-03-07 ENCOUNTER — Inpatient Hospital Stay (HOSPITAL_COMMUNITY)
Admission: EM | Admit: 2017-03-07 | Discharge: 2017-03-12 | DRG: 291 | Disposition: A | Discharge status: Skilled Nursing Facility | Payer: Medicare Other | Source: Skilled Nursing Facility | Attending: Family Medicine | Admitting: Family Medicine

## 2017-03-07 ENCOUNTER — Observation Stay (HOSPITAL_BASED_OUTPATIENT_CLINIC_OR_DEPARTMENT_OTHER): Payer: Medicare Other

## 2017-03-07 ENCOUNTER — Emergency Department (HOSPITAL_COMMUNITY): Payer: Medicare Other

## 2017-03-07 ENCOUNTER — Encounter (HOSPITAL_COMMUNITY): Payer: Self-pay | Admitting: Emergency Medicine

## 2017-03-07 DIAGNOSIS — R739 Hyperglycemia, unspecified: Secondary | ICD-10-CM | POA: Diagnosis not present

## 2017-03-07 DIAGNOSIS — E876 Hypokalemia: Secondary | ICD-10-CM

## 2017-03-07 DIAGNOSIS — E785 Hyperlipidemia, unspecified: Secondary | ICD-10-CM | POA: Diagnosis present

## 2017-03-07 DIAGNOSIS — I351 Nonrheumatic aortic (valve) insufficiency: Secondary | ICD-10-CM

## 2017-03-07 DIAGNOSIS — Z993 Dependence on wheelchair: Secondary | ICD-10-CM

## 2017-03-07 DIAGNOSIS — G8929 Other chronic pain: Secondary | ICD-10-CM | POA: Diagnosis present

## 2017-03-07 DIAGNOSIS — I2510 Atherosclerotic heart disease of native coronary artery without angina pectoris: Secondary | ICD-10-CM | POA: Diagnosis present

## 2017-03-07 DIAGNOSIS — Z7982 Long term (current) use of aspirin: Secondary | ICD-10-CM

## 2017-03-07 DIAGNOSIS — R748 Abnormal levels of other serum enzymes: Secondary | ICD-10-CM

## 2017-03-07 DIAGNOSIS — R7989 Other specified abnormal findings of blood chemistry: Secondary | ICD-10-CM

## 2017-03-07 DIAGNOSIS — H9193 Unspecified hearing loss, bilateral: Secondary | ICD-10-CM | POA: Diagnosis present

## 2017-03-07 DIAGNOSIS — G629 Polyneuropathy, unspecified: Secondary | ICD-10-CM | POA: Diagnosis present

## 2017-03-07 DIAGNOSIS — Z7902 Long term (current) use of antithrombotics/antiplatelets: Secondary | ICD-10-CM

## 2017-03-07 DIAGNOSIS — J9600 Acute respiratory failure, unspecified whether with hypoxia or hypercapnia: Secondary | ICD-10-CM | POA: Diagnosis present

## 2017-03-07 DIAGNOSIS — R54 Age-related physical debility: Secondary | ICD-10-CM | POA: Diagnosis present

## 2017-03-07 DIAGNOSIS — I340 Nonrheumatic mitral (valve) insufficiency: Secondary | ICD-10-CM

## 2017-03-07 DIAGNOSIS — Z66 Do not resuscitate: Secondary | ICD-10-CM | POA: Diagnosis not present

## 2017-03-07 DIAGNOSIS — Z791 Long term (current) use of non-steroidal anti-inflammatories (NSAID): Secondary | ICD-10-CM

## 2017-03-07 DIAGNOSIS — I272 Pulmonary hypertension, unspecified: Secondary | ICD-10-CM

## 2017-03-07 DIAGNOSIS — I429 Cardiomyopathy, unspecified: Secondary | ICD-10-CM | POA: Diagnosis present

## 2017-03-07 DIAGNOSIS — I5033 Acute on chronic diastolic (congestive) heart failure: Secondary | ICD-10-CM | POA: Diagnosis not present

## 2017-03-07 DIAGNOSIS — I10 Essential (primary) hypertension: Secondary | ICD-10-CM | POA: Diagnosis present

## 2017-03-07 DIAGNOSIS — Z9181 History of falling: Secondary | ICD-10-CM

## 2017-03-07 DIAGNOSIS — M1991 Primary osteoarthritis, unspecified site: Secondary | ICD-10-CM | POA: Diagnosis present

## 2017-03-07 DIAGNOSIS — I509 Heart failure, unspecified: Secondary | ICD-10-CM

## 2017-03-07 DIAGNOSIS — Z955 Presence of coronary angioplasty implant and graft: Secondary | ICD-10-CM

## 2017-03-07 DIAGNOSIS — E875 Hyperkalemia: Secondary | ICD-10-CM | POA: Diagnosis present

## 2017-03-07 DIAGNOSIS — R778 Other specified abnormalities of plasma proteins: Secondary | ICD-10-CM

## 2017-03-07 DIAGNOSIS — W19XXXA Unspecified fall, initial encounter: Secondary | ICD-10-CM | POA: Diagnosis present

## 2017-03-07 DIAGNOSIS — J9601 Acute respiratory failure with hypoxia: Secondary | ICD-10-CM | POA: Diagnosis not present

## 2017-03-07 DIAGNOSIS — I130 Hypertensive heart and chronic kidney disease with heart failure and stage 1 through stage 4 chronic kidney disease, or unspecified chronic kidney disease: Secondary | ICD-10-CM | POA: Diagnosis not present

## 2017-03-07 DIAGNOSIS — R197 Diarrhea, unspecified: Secondary | ICD-10-CM

## 2017-03-07 DIAGNOSIS — I252 Old myocardial infarction: Secondary | ICD-10-CM

## 2017-03-07 DIAGNOSIS — G9341 Metabolic encephalopathy: Secondary | ICD-10-CM | POA: Diagnosis not present

## 2017-03-07 DIAGNOSIS — N179 Acute kidney failure, unspecified: Secondary | ICD-10-CM | POA: Diagnosis not present

## 2017-03-07 DIAGNOSIS — Z79899 Other long term (current) drug therapy: Secondary | ICD-10-CM

## 2017-03-07 DIAGNOSIS — N189 Chronic kidney disease, unspecified: Secondary | ICD-10-CM | POA: Diagnosis present

## 2017-03-07 DIAGNOSIS — T502X5A Adverse effect of carbonic-anhydrase inhibitors, benzothiadiazides and other diuretics, initial encounter: Secondary | ICD-10-CM | POA: Diagnosis not present

## 2017-03-07 DIAGNOSIS — K582 Mixed irritable bowel syndrome: Secondary | ICD-10-CM | POA: Diagnosis present

## 2017-03-07 DIAGNOSIS — M792 Neuralgia and neuritis, unspecified: Secondary | ICD-10-CM | POA: Diagnosis present

## 2017-03-07 DIAGNOSIS — I441 Atrioventricular block, second degree: Secondary | ICD-10-CM | POA: Diagnosis not present

## 2017-03-07 DIAGNOSIS — Z880 Allergy status to penicillin: Secondary | ICD-10-CM

## 2017-03-07 DIAGNOSIS — M8580 Other specified disorders of bone density and structure, unspecified site: Secondary | ICD-10-CM | POA: Diagnosis present

## 2017-03-07 DIAGNOSIS — I5043 Acute on chronic combined systolic (congestive) and diastolic (congestive) heart failure: Secondary | ICD-10-CM | POA: Diagnosis present

## 2017-03-07 DIAGNOSIS — I5042 Chronic combined systolic (congestive) and diastolic (congestive) heart failure: Secondary | ICD-10-CM

## 2017-03-07 HISTORY — PX: TRANSTHORACIC ECHOCARDIOGRAM: SHX275

## 2017-03-07 LAB — COMPREHENSIVE METABOLIC PANEL
ALT: 14 U/L (ref 14–54)
AST: 21 U/L (ref 15–41)
Albumin: 4 g/dL (ref 3.5–5.0)
Alkaline Phosphatase: 58 U/L (ref 38–126)
Anion gap: 7 (ref 5–15)
BUN: 19 mg/dL (ref 6–20)
CHLORIDE: 107 mmol/L (ref 101–111)
CO2: 26 mmol/L (ref 22–32)
CREATININE: 1.01 mg/dL — AB (ref 0.44–1.00)
Calcium: 9.7 mg/dL (ref 8.9–10.3)
GFR, EST AFRICAN AMERICAN: 56 mL/min — AB (ref >60)
GFR, EST NON AFRICAN AMERICAN: 49 mL/min — AB (ref >60)
Glucose, Bld: 165 mg/dL — ABNORMAL HIGH (ref 65–99)
POTASSIUM: 3.3 mmol/L — AB (ref 3.5–5.1)
Sodium: 140 mmol/L (ref 135–145)
TOTAL PROTEIN: 6.2 g/dL — AB (ref 6.5–8.1)
Total Bilirubin: 0.9 mg/dL (ref 0.3–1.2)

## 2017-03-07 LAB — CBC
HCT: 39.7 % (ref 36.0–46.0)
HCT: 39.8 % (ref 36.0–46.0)
Hemoglobin: 13 g/dL (ref 12.0–15.0)
Hemoglobin: 12.9 g/dL (ref 12.0–15.0)
MCH: 30.1 pg (ref 26.0–34.0)
MCH: 30 pg (ref 26.0–34.0)
MCHC: 32.7 g/dL (ref 30.0–36.0)
MCHC: 32.4 g/dL (ref 30.0–36.0)
MCV: 91.9 fL (ref 78.0–100.0)
MCV: 92.6 fL (ref 78.0–100.0)
PLATELETS: 258 10*3/uL (ref 150–400)
PLATELETS: 254 10*3/uL (ref 150–400)
RBC: 4.32 MIL/uL (ref 3.87–5.11)
RBC: 4.3 MIL/uL (ref 3.87–5.11)
RDW: 14.5 % (ref 11.5–15.5)
RDW: 14.9 % (ref 11.5–15.5)
WBC: 8.7 10*3/uL (ref 4.0–10.5)
WBC: 9.6 10*3/uL (ref 4.0–10.5)

## 2017-03-07 LAB — URINALYSIS, ROUTINE W REFLEX MICROSCOPIC
BACTERIA UA: NONE SEEN
Bilirubin Urine: NEGATIVE
Glucose, UA: NEGATIVE mg/dL
Hgb urine dipstick: NEGATIVE
KETONES UR: NEGATIVE mg/dL
NITRITE: NEGATIVE
PROTEIN: 100 mg/dL — AB
RBC / HPF: NONE SEEN RBC/hpf (ref 0–5)
SQUAMOUS EPITHELIAL / LPF: NONE SEEN
Specific Gravity, Urine: 1.012 (ref 1.005–1.030)
pH: 5 (ref 5.0–8.0)

## 2017-03-07 LAB — I-STAT TROPONIN, ED: TROPONIN I, POC: 0.12 ng/mL — AB (ref 0.00–0.08)

## 2017-03-07 LAB — BRAIN NATRIURETIC PEPTIDE: B Natriuretic Peptide: 703.6 pg/mL — ABNORMAL HIGH (ref 0.0–100.0)

## 2017-03-07 LAB — ECHOCARDIOGRAM COMPLETE: WEIGHTICAEL: 2176 [oz_av]

## 2017-03-07 MED ORDER — METOPROLOL SUCCINATE ER 25 MG PO TB24
25.0000 mg | ORAL_TABLET | Freq: Every day | ORAL | Status: DC
  Administered 2017-03-08 – 2017-03-12 (×5): 25 mg via ORAL
  Filled 2017-03-07 (×5): qty 1

## 2017-03-07 MED ORDER — SODIUM CHLORIDE 0.9 % IV SOLN: INTRAVENOUS | Status: DC

## 2017-03-07 MED ORDER — FUROSEMIDE 10 MG/ML IJ SOLN
20.0000 mg | Freq: Two times a day (BID) | INTRAMUSCULAR | Status: DC
  Administered 2017-03-07: 20 mg via INTRAVENOUS
  Filled 2017-03-07: qty 2

## 2017-03-07 MED ORDER — ASPIRIN EC 81 MG PO TBEC
81.0000 mg | DELAYED_RELEASE_TABLET | Freq: Every day | ORAL | Status: DC
  Administered 2017-03-08 – 2017-03-12 (×6): 81 mg via ORAL
  Filled 2017-03-07 (×7): qty 1

## 2017-03-07 MED ORDER — ONDANSETRON HCL 4 MG/2ML IJ SOLN: 4.0000 mg | Freq: Four times a day (QID) | INTRAMUSCULAR | Status: DC | PRN

## 2017-03-07 MED ORDER — MIRTAZAPINE 15 MG PO TBDP
7.5000 mg | ORAL_TABLET | Freq: Every day | ORAL | Status: DC
  Administered 2017-03-08 – 2017-03-11 (×4): 7.5 mg via ORAL
  Filled 2017-03-07 (×5): qty 0.5

## 2017-03-07 MED ORDER — POTASSIUM CHLORIDE CRYS ER 20 MEQ PO TBCR
40.0000 meq | EXTENDED_RELEASE_TABLET | Freq: Two times a day (BID) | ORAL | Status: DC
  Administered 2017-03-08 – 2017-03-09 (×5): 40 meq via ORAL
  Filled 2017-03-07 (×6): qty 2

## 2017-03-07 MED ORDER — ACETAMINOPHEN 325 MG PO TABS
650.0000 mg | ORAL_TABLET | Freq: Four times a day (QID) | ORAL | Status: DC | PRN
  Administered 2017-03-12: 650 mg via ORAL
  Filled 2017-03-07: qty 2

## 2017-03-07 MED ORDER — ACETAMINOPHEN 650 MG RE SUPP: 650.0000 mg | Freq: Four times a day (QID) | RECTAL | Status: DC | PRN

## 2017-03-07 MED ORDER — DIPHENOXYLATE-ATROPINE 2.5-0.025 MG PO TABS: 1.0000 | ORAL_TABLET | Freq: Four times a day (QID) | ORAL | Status: DC | PRN

## 2017-03-07 MED ORDER — PANTOPRAZOLE SODIUM 40 MG PO TBEC: 40.0000 mg | DELAYED_RELEASE_TABLET | Freq: Every day | ORAL | 3 refills | Status: DC

## 2017-03-07 MED ORDER — CLOPIDOGREL BISULFATE 75 MG PO TABS
75.0000 mg | ORAL_TABLET | Freq: Every day | ORAL | Status: DC
  Administered 2017-03-08 – 2017-03-12 (×6): 75 mg via ORAL
  Filled 2017-03-07 (×7): qty 1

## 2017-03-07 MED ORDER — ONDANSETRON HCL 4 MG PO TABS: 4.0000 mg | ORAL_TABLET | Freq: Four times a day (QID) | ORAL | Status: DC | PRN

## 2017-03-07 MED ORDER — FUROSEMIDE 10 MG/ML IJ SOLN
40.0000 mg | Freq: Once | INTRAMUSCULAR | Status: AC
  Administered 2017-03-07: 40 mg via INTRAVENOUS
  Filled 2017-03-07: qty 4

## 2017-03-07 MED ORDER — GABAPENTIN 100 MG PO CAPS
100.0000 mg | ORAL_CAPSULE | Freq: Two times a day (BID) | ORAL | Status: DC
  Administered 2017-03-08 – 2017-03-12 (×10): 100 mg via ORAL
  Filled 2017-03-07 (×11): qty 1

## 2017-03-07 MED ORDER — ENOXAPARIN SODIUM 40 MG/0.4ML ~~LOC~~ SOLN
40.0000 mg | SUBCUTANEOUS | Status: DC
  Administered 2017-03-08 – 2017-03-09 (×2): 40 mg via SUBCUTANEOUS
  Filled 2017-03-07 (×4): qty 0.4

## 2017-03-07 MED ORDER — LISINOPRIL 20 MG PO TABS
20.0000 mg | ORAL_TABLET | Freq: Every day | ORAL | Status: DC
  Administered 2017-03-08 – 2017-03-11 (×5): 20 mg via ORAL
  Filled 2017-03-07 (×6): qty 1

## 2017-03-07 MED ORDER — ATORVASTATIN CALCIUM 20 MG PO TABS
20.0000 mg | ORAL_TABLET | Freq: Every day | ORAL | Status: DC
  Administered 2017-03-08 – 2017-03-12 (×5): 20 mg via ORAL
  Filled 2017-03-07 (×5): qty 1

## 2017-03-07 MED ORDER — LOPERAMIDE HCL 2 MG PO CAPS: 2.0000 mg | ORAL_CAPSULE | Freq: Four times a day (QID) | ORAL | Status: DC | PRN

## 2017-03-07 NOTE — ED Notes (Signed)
Attempted to call report

## 2017-03-07 NOTE — ED Notes (Signed)
RN requested the med tec contact the facility to verify if meds were given this morning, MAR does not indicate.  Pharmacy tec will have meds times rescheduled if needed.

## 2017-03-07 NOTE — Progress Notes (Signed)
  Echocardiogram 2D Echocardiogram has been performed.  [name] [name] [name] [name] 03/07/2017, 3:02 PM

## 2017-03-07 NOTE — ED Provider Notes (Signed)
[facility]-EMERGENCY DEPT Provider Note   CSN: [id number] Arrival date & time: 03/07/17  1006     History   Chief Complaint Chief Complaint  Patient presents with  . Shortness of Breath    HPI [name] [name] is a 83 y.o. female.  Patient c/o sob for the past few days. Symptoms gradual onset onset, slowly worse, increased today. Symptoms moderate.  Denies home o2 use. +increased bil leg swelling. Denies chest pain or discomfort. No cough or uri c/o. Compliant w normal meds. Normal urine output. +orthopnea.    The history is provided by the patient.  Shortness of Breath  Associated symptoms include leg swelling. Pertinent negatives include no fever, no headaches, no sore throat, no neck pain, no cough, no chest pain, no vomiting, no abdominal pain and no rash.    Past Medical History:  Diagnosis Date  . Arthritis   . CAD (coronary artery disease)    a. remote MI/stenting in [place] in 2002. b. Nuc 2009: prior inferior MI but no evidence of ischemia, EF 53% with akinesis of inferior wall.  [name]. Hearing loss    a. bilat hearing aids.  . Hip fracture (HCC)    a. s/p prior ORIF.  [name]. HTN (hypertension)   . Hyperlipidemia   . MI (myocardial infarction) (HCC) 2002  . Neuropathy   . Sciatica     Patient Active Problem List   Diagnosis Date Noted  . Right hip pain 12/11/2016  . Pulmonary hypertension, moderate to severe (HCC) 09/24/2015  . AVN (avascular necrosis of bone) (HCC) 09/23/2015  . Abnormal TSH 09/23/2015  . Hypokalemia 09/23/2015  . Sinus bradycardia 09/23/2015  . Preoperative cardiovascular examination 09/23/2015  . Hyponatremia 09/22/2015  . Acute hyponatremia 09/22/2015  . Fall   . Primary osteoarthritis of right knee 08/29/2013  . Postoperative anemia due to acute blood loss 12/18/2011    Class: Acute  . Hyponatremia with excess extracellular fluid volume 12/18/2011    Class: Acute  . Hip fracture, intertrochanteric, right, closed, initial encounter 12/15/2011   Class: Acute  . Neuropathic pain 10/19/2011  . HTN (hypertension)   . Hyperlipidemia   . VITAMIN D DEFICIENCY 04/19/2010  . Hyperlipemia 04/19/2010  . 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 Essential hypertension 04/19/2010  . CAD - MI/PCI in 2002 05/23/2001    Past Surgical History:  Procedure Laterality Date  . ABDOMINAL HYSTERECTOMY    . CATARACT EXTRACTION  1998   RIGHT AND LEFT EYE  . CHOLECYSTECTOMY  2000  . COMPRESSION HIP SCREW  12/16/2011   Procedure: COMPRESSION HIP;  Surgeon: Kerrin ChampagneJames E Nitka, MD;  Location: [facility];  Service: Orthopedics;  Laterality: Right;  . DILATION AND CURETTAGE OF UTERUS  1960  . HYSTRECTOMY    . PTCA  2002  . REFRACTIVE SURGERY  2002    OB History    No data available       Home Medications    Prior to Admission medications   Medication Sig Start Date End Date Taking? Authorizing Provider  acetaminophen (TYLENOL) 500 MG tablet Take 1,000 mg by mouth every 6 (six) hours as needed for mild pain.    [provider]  aspirin EC 81 MG tablet Take 81 mg by mouth daily.    [provider]  atorvastatin (LIPITOR) 20 MG tablet TAKE 1 TABLET DAILY 05/24/15   Burchette, Elberta FortisBruce W, MD  clopidogrel (PLAVIX) 75 MG tablet Take 75 mg by mouth daily.    [provider]  diphenoxylate-atropine (LOMOTIL) 2.5-0.025  MG tablet Take 1 tablet by mouth 4 (four) times daily as needed for diarrhea or loose stools.    [provider]  gabapentin (NEURONTIN) 100 MG capsule TAKE 1 CAPSULE TWICE A DAY Patient taking differently: TAKE 100MG  BY MOUTH TWICE DAILY 02/24/15   Burchette, Elberta FortisBruce W, MD  lisinopril (PRINIVIL,ZESTRIL) 20 MG tablet TAKE 1 TABLET DAILY 05/24/15   Burchette, Elberta FortisBruce W, MD  loperamide (IMODIUM A-D) 2 MG tablet Take 2 mg by mouth 4 (four) times daily as needed for diarrhea or loose stools.    [provider]  metoprolol succinate (TOPROL-XL) 25 MG 24 hr tablet Take 1 tablet (25 mg total) by mouth daily. Take with or immediately  following a meal. 09/23/15   Dorothea OgleMyers, Iskra M, MD  mirtazapine (REMERON SOL-TAB) 15 MG disintegrating tablet PLACE 1 TABLET ON TONGUE AT BEDTIME Patient taking differently: PLACE 1 TABLET=7.5MG  ON TONGUE AT BEDTIME 09/23/15   Burchette, Elberta FortisBruce W, MD    Family History Family History  Problem Relation Age of Onset  . Hypertension Father        DECEASED  . Cancer Sister [phone]       BREAST/BONE CA  . Pneumonia Mother 4286  . Aneurysm Brother [phone]       DECEASED    Social History Social History  Substance Use Topics  . Smoking status: Never Smoker  . Smokeless tobacco: Never Used  . Alcohol use No     Allergies   Penicillins   Review of Systems Review of Systems  Constitutional: Negative for chills and fever.  HENT: Negative for sore throat.   Eyes: Negative for redness.  Respiratory: Positive for shortness of breath. Negative for cough.   Cardiovascular: Positive for leg swelling. Negative for chest pain.  Gastrointestinal: Positive for diarrhea. Negative for abdominal pain and vomiting.  Endocrine: Negative for polyuria.  Genitourinary: Negative for flank pain.  Musculoskeletal: Negative for back pain and neck pain.  Skin: Negative for rash.  Neurological: Negative for headaches.  Hematological: Does not bruise/bleed easily.  Psychiatric/Behavioral: Negative for confusion.     Physical Exam Updated Vital Signs BP (!) 171/81   Pulse 68   Temp 97.9 F (36.6 C) (Oral)   Resp (!) 21   Wt 61.7 kg (136 lb)   SpO2 100%   BMI 24.09 kg/m   Physical Exam  Constitutional: She appears well-developed and well-nourished. No distress.  HENT:  Mouth/Throat: Oropharynx is clear and moist.  Eyes: Conjunctivae are normal. No scleral icterus.  Neck: Neck supple. No tracheal deviation present.  Cardiovascular: Normal rate, regular rhythm, normal heart sounds and intact distal pulses.   Pulmonary/Chest: She is in respiratory distress.  Rales bases  Abdominal: Soft. Normal appearance  and bowel sounds are normal. She exhibits no distension. There is no tenderness.  Genitourinary:  Genitourinary Comments: No cva tenderness  Musculoskeletal: She exhibits edema.  Moderate swelling to knees bil  Neurological: She is alert.  Skin: Skin is warm and dry. No rash noted. She is not diaphoretic.  Psychiatric: She has a normal mood and affect.  Nursing note and vitals reviewed.    ED Treatments / Results  Labs (all labs ordered are listed, but only abnormal results are displayed) Results for orders placed or performed during the hospital encounter of 03/07/17  Brain natriuretic peptide  Result Value Ref Range   B Natriuretic Peptide 703.6 (H) 0.0 - 100.0 pg/mL  CBC  Result Value Ref Range   WBC 8.7 4.0 - 10.5 K/uL  RBC 4.32 3.87 - 5.11 MIL/uL   Hemoglobin 13.0 12.0 - 15.0 g/dL   HCT 16.1 09.6 - 04.5 %   MCV 91.9 78.0 - 100.0 fL   MCH 30.1 26.0 - 34.0 pg   MCHC 32.7 30.0 - 36.0 g/dL   RDW 40.9 81.1 - 91.4 %   Platelets 258 150 - 400 K/uL  Comprehensive metabolic panel  Result Value Ref Range   Sodium 140 135 - 145 mmol/L   Potassium 3.3 (L) 3.5 - 5.1 mmol/L   Chloride 107 101 - 111 mmol/L   CO2 26 22 - 32 mmol/L   Glucose, Bld 165 (H) 65 - 99 mg/dL   BUN 19 6 - 20 mg/dL   Creatinine, Ser 7.82 (H) 0.44 - 1.00 mg/dL   Calcium 9.7 8.9 - 95.6 mg/dL   Total Protein 6.2 (L) 6.5 - 8.1 g/dL   Albumin 4.0 3.5 - 5.0 g/dL   AST 21 15 - 41 U/L   ALT 14 14 - 54 U/L   Alkaline Phosphatase 58 38 - 126 U/L   Total Bilirubin 0.9 0.3 - 1.2 mg/dL   GFR calc non Af Amer 49 (L) >60 mL/min   GFR calc Af Amer 56 (L) >60 mL/min   Anion gap 7 5 - 15  I-stat troponin, ED  Result Value Ref Range   Troponin i, poc 0.12 (HH) 0.00 - 0.08 ng/mL   Comment NOTIFIED PHYSICIAN    Comment 3            EKG  EKG Interpretation  Date/Time:  Wednesday March 07 2017 10:24:35 EDT Ventricular Rate:  71 PR Interval:    QRS Duration: 98 QT Interval:  503 QTC Calculation: 547 R  Axis:   -3 Text Interpretation:  Sinus rhythm Nonspecific T wave abnormality Prolonged QT interval Confirmed by Cathren Laine ([id number]) on 03/07/2017 11:15:10 AM       Radiology Dg Chest Port 1 View  Result Date: 03/07/2017 CLINICAL DATA:  Increasing shortness of breath. Bilateral lower extremity edema. EXAM: PORTABLE CHEST 1 VIEW COMPARISON:  12/15/2011 FINDINGS: The patient has and Kerley B-lines at the right lung base consistent with mild interstitial edema. Heart size is normal. Slight atelectasis at the lung bases. I suspect there small bilateral effusions. No acute bone abnormality. IMPRESSION: Mild interstitial edema with slight atelectasis at the bases. Probable small effusions. Electronically Signed   By: Francene Boyers M.D.   On: 03/07/2017 10:43    Procedures Procedures (including critical care time)  Medications Ordered in ED Medications  0.9 %  sodium chloride infusion (not administered)     Initial Impression / Assessment and Plan / ED Course  I have reviewed the triage vital signs and the nursing notes.  Pertinent labs & imaging results that were available during my care of the patient were reviewed by me and considered in my medical decision making (see chart for details).  Iv ns. Continuous pulse ox and monitor. o2 [place].   Labs.  Reviewed nursing notes and prior charts for additional history.   Lasix iv.    bp improved from prior.  Still no chest pain or discomfort.  Trop elev, ?demand.   Will admit to med service.    Final Clinical Impressions(s) / ED Diagnoses   Final diagnoses:  None    New Prescriptions New Prescriptions   No medications on file     Cathren Laine, MD 03/07/17 1116

## 2017-03-07 NOTE — ED Triage Notes (Signed)
Pt here via [facility] for sob, bil LE edema.  No hx of chf.  Was seen by [facility] several days ago with sats of 78%.  Today room air sats were 86% but increased to 99% on NRB.  RR 26, bp 220/102, hr 67 nsr.  20 R FA.

## 2017-03-07 NOTE — ED Notes (Signed)
Dr. Steinl at bedside at this time.  

## 2017-03-07 NOTE — H&P (Signed)
History and Physical    Ilda MoriHelen Whitehair ZOX:[id number] DOB: June 24, 1928 DOA: 03/07/2017  PCP: Kristian CoveyBurchette, Bruce W, MD [facility]  Chief Complaint: Leg swelling and sob  HPI: Ilda MoriHelen Gebhart is a 83 y.o. female with medical history significant of sciatica, peripheral take, CAD/MI, hyperlipidemia, hypertension, hip fracture, hearing loss. Patient reports a several day history of worsening shortness of breath. Initially intermittent but now constant. Patient endorses her baseline orthopnea typically not on home oxygen but on day of admission patient had an O2 saturation in the mid 70s on room air. Patient does endorse leg swelling and weight gain which is normal for her. Denies any cough, sinus congestion or chest congestion, fever, chest pain, palpitations, abdominal pain, dysuria, frequency, flank pain, back pain, neck stiffness, headache, focal neurological deficit.   Pt also intermittently complaining of headache which originates from her neck posteriorly which patient states is due to her arthritis. Currently asymptomatic.  Of note patient had a fall 1 day prior to admission. Patient stays other in a wheelchair or bed throughout the day and prior to admission was able to transfer from one to the other with significant effort. Patient has been becoming more and more weak over the last several months..   ED Course: Objective findings outlined below. Lasix administered.  Review of Systems: As per HPI otherwise all other systems reviewed and are negative  Ambulatory Status: Difficulty transferring from bed to chair. Requires assistance. Mechanical fall prior to admission.  Past Medical History:  Diagnosis Date  . Arthritis   . CAD (coronary artery disease)    a. [place] MI[place]st[place] in [place] in 2002. b. Nuc 2009: prior inferior MI but no evidence of ischemia, EF 53% with akinesis of inferior wall.  [name]. Hearing loss    a. bilat hearing aids.  . Hip fracture (HCC)    a. s/p prior  ORIF.  [name]. HTN (hypertension)   . Hyperlipidemia   . MI (myocardial infarction) (HCC) 2002  . Neuropathy   . Sciatica     Past Surgical History:  Procedure Laterality Date  . ABDOMINAL HYSTERECTOMY    . CATARACT EXTRACTION  1998   RIGHT AND LEFT EYE  . CHOLECYSTECTOMY  2000  . COMPRESSION HIP SCREW  12/16/2011   Procedure: COMPRESSION HIP;  Surgeon: Kerrin ChampagneJames E Nitka, MD;  Location: [facility];  Service: Orthopedics;  Laterality: Right;  . DILATION AND CURETTAGE OF UTERUS  1960  . HYSTRECTOMY    . PTCA  2002  . REFRACTIVE SURGERY  2002    Social History   Social History  . Marital status: Single    Spouse name: N/A  . Number of children: N/A  . Years of education: N/A   Occupational History  . Not on file.   Social History Main Topics  . Smoking status: Never Smoker  . Smokeless tobacco: Never Used  . Alcohol use No  . Drug use: No  . Sexual activity: No   Other Topics Concern  . Not on file   Social History Narrative  . No narrative on file    Allergies  Allergen Reactions  . Penicillins Swelling    Swelling of hands and face Has patient had a PCN reaction causing immediate rash, facial/tongue/throat swelling, SOB or lightheadedness with hypotension: unknown Has patient had a PCN reaction causing severe rash involving mucus membranes or skin necrosis: no Has patient had a PCN reaction that required hospitalization: unknown Has patient had a PCN reaction occurring within the last 10  years: No If all of the above answers are "NO", then may proceed with Cephalosporin use.     Family History  Problem Relation Age of Onset  . Hypertension Father        DECEASED  . Cancer Sister 68       BREAST/BONE CA  . Pneumonia Mother 73  . Aneurysm Brother 27       DECEASED      Prior to Admission medications   Medication Sig Start Date End Date Taking? Authorizing Provider  acetaminophen (TYLENOL) 500 MG tablet Take 1,000 mg by mouth every 6 (six) hours as needed for mild  pain.    [provider]  aspirin EC 81 MG tablet Take 81 mg by mouth daily.    [provider]  atorvastatin (LIPITOR) 20 MG tablet TAKE 1 TABLET DAILY 05/24/15   Burchette, Elberta Fortis, MD  clopidogrel (PLAVIX) 75 MG tablet Take 75 mg by mouth daily.    [provider]  diphenoxylate-atropine (LOMOTIL) 2.5-0.025 MG tablet Take 1 tablet by mouth 4 (four) times daily as needed for diarrhea or loose stools.    [provider]  gabapentin (NEURONTIN) 100 MG capsule TAKE 1 CAPSULE TWICE A DAY Patient taking differently: TAKE 100MG  BY MOUTH TWICE DAILY 02/24/15   Burchette, Elberta Fortis, MD  lisinopril (PRINIVIL,ZESTRIL) 20 MG tablet TAKE 1 TABLET DAILY 05/24/15   Burchette, Elberta Fortis, MD  loperamide (IMODIUM A-D) 2 MG tablet Take 2 mg by mouth 4 (four) times daily as needed for diarrhea or loose stools.    [provider]  metoprolol succinate (TOPROL-XL) 25 MG 24 hr tablet Take 1 tablet (25 mg total) by mouth daily. Take with or immediately following a meal. 09/23/15   Dorothea Ogle, MD  mirtazapine (REMERON SOL-TAB) 15 MG disintegrating tablet PLACE 1 TABLET ON TONGUE AT BEDTIME Patient taking differently: PLACE 1 TABLET=7.5MG  ON TONGUE AT BEDTIME 09/23/15   Kristian Covey, MD    Physical Exam: Vitals:   03/07/17 1045 03/07/17 1100 03/07/17 1145 03/07/17 1215  BP: (!) 164/73 (!) 176/76 (!) 156/83 (!) 181/87  Pulse: 69 66 65 69  Resp: 20 20 (!) 24 (!) 22  Temp:      TempSrc:      SpO2: 100% 100% 90% (!) 88%  Weight:         General:  Appears calm and comfortable Eyes:  PERRL, EOMI, normal lids, iris ENT:  grossly normal hearing, lips & tongue, mmm Neck:  no LAD, masses or thyromegaly Cardiovascular:  RRR, no m/r/g. 1-2+ LE edema.  Respiratory: Few crackles bilaterally with decreased overall air movement. 87% on room air. Abdomen:  soft, ntnd, NABS Skin:  no rash or induration seen on limited exam Musculoskeletal:  grossly normal tone BUE/BLE, good  ROM, no bony abnormality Psychiatric:  grossly normal mood and affect, speech fluent and appropriate, AOx3 Neurologic:  CN 2-12 grossly intact, moves all extremities in coordinated fashion, sensation intact  Labs on Admission: I have personally reviewed following labs and imaging studies  CBC:  Recent Labs Lab 03/07/17 1025  WBC 8.7  HGB 13.0  HCT 39.7  MCV 91.9  PLT 258   Basic Metabolic Panel:  Recent Labs Lab 03/07/17 1025  NA 140  K 3.3*  CL 107  CO2 26  GLUCOSE 165*  BUN 19  CREATININE 1.01*  CALCIUM 9.7   GFR: CrCl cannot be calculated (Unknown ideal weight.). Liver Function Tests:  Recent Labs Lab 03/07/17 1025  AST 21  ALT 14  ALKPHOS 58  BILITOT 0.9  PROT 6.2*  ALBUMIN 4.0   No results for input(s): LIPASE, AMYLASE in the last 168 hours. No results for input(s): AMMONIA in the last 168 hours. Coagulation Profile: No results for input(s): INR, PROTIME in the last 168 hours. Cardiac Enzymes: No results for input(s): CKTOTAL, CKMB, CKMBINDEX, TROPONINI in the last 168 hours. BNP (last 3 results) No results for input(s): PROBNP in the last 8760 hours. HbA1C: No results for input(s): HGBA1C in the last 72 hours. CBG: No results for input(s): GLUCAP in the last 168 hours. Lipid Profile: No results for input(s): CHOL, HDL, LDLCALC, TRIG, CHOLHDL, LDLDIRECT in the last 72 hours. Thyroid Function Tests: No results for input(s): TSH, T4TOTAL, FREET4, T3FREE, THYROIDAB in the last 72 hours. Anemia Panel: No results for input(s): VITAMINB12, FOLATE, FERRITIN, TIBC, IRON, RETICCTPCT in the last 72 hours. Urine analysis:    Component Value Date/Time   COLORURINE YELLOW 03/07/2017 1255   APPEARANCEUR CLEAR 03/07/2017 1255   LABSPEC 1.012 03/07/2017 1255   PHURINE 5.0 03/07/2017 1255   GLUCOSEU NEGATIVE 03/07/2017 1255   HGBUR NEGATIVE 03/07/2017 1255   BILIRUBINUR NEGATIVE 03/07/2017 1255   KETONESUR NEGATIVE 03/07/2017 1255   PROTEINUR 100 (A)  03/07/2017 1255   UROBILINOGEN 0.2 12/16/2011 0630   NITRITE NEGATIVE 03/07/2017 1255   LEUKOCYTESUR TRACE (A) 03/07/2017 1255    Creatinine Clearance: CrCl cannot be calculated (Unknown ideal weight.).  Sepsis Labs: @LABRCNTIP (procalcitonin:4,lacticidven:4) )No results found for this or any previous visit (from the past 240 hour(s)).   Radiological Exams on Admission: Dg Chest Port 1 View  Result Date: 03/07/2017 CLINICAL DATA:  Increasing shortness of breath. Bilateral lower extremity edema. EXAM: PORTABLE CHEST 1 VIEW COMPARISON:  12/15/2011 FINDINGS: The patient has and Kerley B-lines at the right lung base consistent with mild interstitial edema. Heart size is normal. Slight atelectasis at the lung bases. I suspect there small bilateral effusions. No acute bone abnormality. IMPRESSION: Mild interstitial edema with slight atelectasis at the bases. Probable small effusions. Electronically Signed   By: Francene BoyersJames  Maxwell M.D.   On: 03/07/2017 10:43    EKG: Independently reviewed. Sinus, non-specific changes. No ACS  Assessment/Plan Active Problems:   Essential hypertension   Hyperlipidemia   Neuropathic pain   Fall   Acute respiratory failure (HCC)   Acute on chronic diastolic CHF (congestive heart failure) (HCC)   Hyperglycemia   Acute respiratory failure: Likely secondary to acute on chronic diastolic congestive heart failure. CXR as above. BNP greater than 700. Doubt infectious process such as pneumonia or COPD, or intravascular concerns such as pulmonary embolus. - Lasix 40 IV 1 in the ED followed by Lasix 20 BID - Strict I's and O's, daily weights - Repeat cardio echogram - Wean oxygen as able.  CAD/MI: no evience of ACS. Trop 0.15 likely from cardiac strain from CHF exacerbation. EKG essentially nml - ASA, Plavix, Statin  Chronic pain: - continue neurontin  IBS: alternating type: - continue imodioum and lomotil prn  Fall: mechanical. Poor nutrition lately -  Nutrition consult, Pt/OT  Hyperglycemia: 165. No h/o DM - A1c  HTN: - continue metop, Lisinopril    DVT prophylaxis: hep  Code Status: full  Family Communication: son and neice  Disposition Plan: pending improvement  Consults called: none  Admission status: observation    MERRELL, DAVID J MD [facility]  If 7PM-7AM, please contact night-coverage [email or web address] Password [email or web address]  03/07/2017, 2:32 PM

## 2017-03-08 ENCOUNTER — Observation Stay (HOSPITAL_COMMUNITY): Payer: Medicare Other

## 2017-03-08 ENCOUNTER — Encounter (HOSPITAL_COMMUNITY): Payer: Self-pay | Admitting: *Deleted

## 2017-03-08 ENCOUNTER — Ambulatory Visit (INDEPENDENT_AMBULATORY_CARE_PROVIDER_SITE_OTHER): Payer: Medicare Other | Admitting: Physician Assistant

## 2017-03-08 DIAGNOSIS — R531 Weakness: Secondary | ICD-10-CM | POA: Diagnosis not present

## 2017-03-08 DIAGNOSIS — I50813 Acute on chronic right heart failure: Secondary | ICD-10-CM | POA: Diagnosis not present

## 2017-03-08 DIAGNOSIS — G8929 Other chronic pain: Secondary | ICD-10-CM | POA: Diagnosis present

## 2017-03-08 DIAGNOSIS — G629 Polyneuropathy, unspecified: Secondary | ICD-10-CM | POA: Diagnosis present

## 2017-03-08 DIAGNOSIS — R54 Age-related physical debility: Secondary | ICD-10-CM | POA: Diagnosis present

## 2017-03-08 DIAGNOSIS — R739 Hyperglycemia, unspecified: Secondary | ICD-10-CM | POA: Diagnosis present

## 2017-03-08 DIAGNOSIS — Z8673 Personal history of transient ischemic attack (TIA), and cerebral infarction without residual deficits: Secondary | ICD-10-CM | POA: Diagnosis not present

## 2017-03-08 DIAGNOSIS — E785 Hyperlipidemia, unspecified: Secondary | ICD-10-CM | POA: Diagnosis not present

## 2017-03-08 DIAGNOSIS — I25118 Atherosclerotic heart disease of native coronary artery with other forms of angina pectoris: Secondary | ICD-10-CM | POA: Diagnosis not present

## 2017-03-08 DIAGNOSIS — I272 Pulmonary hypertension, unspecified: Secondary | ICD-10-CM | POA: Diagnosis not present

## 2017-03-08 DIAGNOSIS — N179 Acute kidney failure, unspecified: Secondary | ICD-10-CM | POA: Diagnosis not present

## 2017-03-08 DIAGNOSIS — I2510 Atherosclerotic heart disease of native coronary artery without angina pectoris: Secondary | ICD-10-CM | POA: Diagnosis present

## 2017-03-08 DIAGNOSIS — Z955 Presence of coronary angioplasty implant and graft: Secondary | ICD-10-CM | POA: Diagnosis not present

## 2017-03-08 DIAGNOSIS — I441 Atrioventricular block, second degree: Secondary | ICD-10-CM | POA: Diagnosis not present

## 2017-03-08 DIAGNOSIS — E875 Hyperkalemia: Secondary | ICD-10-CM | POA: Diagnosis present

## 2017-03-08 DIAGNOSIS — I252 Old myocardial infarction: Secondary | ICD-10-CM

## 2017-03-08 DIAGNOSIS — T502X5A Adverse effect of carbonic-anhydrase inhibitors, benzothiadiazides and other diuretics, initial encounter: Secondary | ICD-10-CM | POA: Diagnosis not present

## 2017-03-08 DIAGNOSIS — I5041 Acute combined systolic (congestive) and diastolic (congestive) heart failure: Secondary | ICD-10-CM | POA: Diagnosis not present

## 2017-03-08 DIAGNOSIS — I5021 Acute systolic (congestive) heart failure: Secondary | ICD-10-CM | POA: Diagnosis not present

## 2017-03-08 DIAGNOSIS — H9193 Unspecified hearing loss, bilateral: Secondary | ICD-10-CM | POA: Diagnosis present

## 2017-03-08 DIAGNOSIS — Z7902 Long term (current) use of antithrombotics/antiplatelets: Secondary | ICD-10-CM | POA: Diagnosis not present

## 2017-03-08 DIAGNOSIS — N183 Chronic kidney disease, stage 3 (moderate): Secondary | ICD-10-CM | POA: Diagnosis not present

## 2017-03-08 DIAGNOSIS — N189 Chronic kidney disease, unspecified: Secondary | ICD-10-CM | POA: Diagnosis present

## 2017-03-08 DIAGNOSIS — Z66 Do not resuscitate: Secondary | ICD-10-CM | POA: Diagnosis not present

## 2017-03-08 DIAGNOSIS — I5043 Acute on chronic combined systolic (congestive) and diastolic (congestive) heart failure: Secondary | ICD-10-CM | POA: Diagnosis not present

## 2017-03-08 DIAGNOSIS — I130 Hypertensive heart and chronic kidney disease with heart failure and stage 1 through stage 4 chronic kidney disease, or unspecified chronic kidney disease: Secondary | ICD-10-CM | POA: Diagnosis present

## 2017-03-08 DIAGNOSIS — M1991 Primary osteoarthritis, unspecified site: Secondary | ICD-10-CM | POA: Diagnosis present

## 2017-03-08 DIAGNOSIS — I429 Cardiomyopathy, unspecified: Secondary | ICD-10-CM | POA: Diagnosis present

## 2017-03-08 DIAGNOSIS — I10 Essential (primary) hypertension: Secondary | ICD-10-CM | POA: Diagnosis not present

## 2017-03-08 DIAGNOSIS — M8580 Other specified disorders of bone density and structure, unspecified site: Secondary | ICD-10-CM | POA: Diagnosis present

## 2017-03-08 DIAGNOSIS — R404 Transient alteration of awareness: Secondary | ICD-10-CM | POA: Diagnosis not present

## 2017-03-08 DIAGNOSIS — G9341 Metabolic encephalopathy: Secondary | ICD-10-CM | POA: Diagnosis not present

## 2017-03-08 DIAGNOSIS — K582 Mixed irritable bowel syndrome: Secondary | ICD-10-CM | POA: Diagnosis present

## 2017-03-08 DIAGNOSIS — W19XXXA Unspecified fall, initial encounter: Secondary | ICD-10-CM | POA: Diagnosis not present

## 2017-03-08 DIAGNOSIS — J9601 Acute respiratory failure with hypoxia: Secondary | ICD-10-CM | POA: Diagnosis present

## 2017-03-08 DIAGNOSIS — E876 Hypokalemia: Secondary | ICD-10-CM | POA: Diagnosis not present

## 2017-03-08 LAB — BASIC METABOLIC PANEL
Anion gap: 10 (ref 5–15)
BUN: 15 mg/dL (ref 6–20)
CHLORIDE: 105 mmol/L (ref 101–111)
CO2: 27 mmol/L (ref 22–32)
CREATININE: 0.92 mg/dL (ref 0.44–1.00)
Calcium: 9.5 mg/dL (ref 8.9–10.3)
GFR calc Af Amer: >60 mL/min (ref >60)
GFR calc non Af Amer: 54 mL/min — ABNORMAL LOW (ref >60)
Glucose, Bld: 115 mg/dL — ABNORMAL HIGH (ref 65–99)
POTASSIUM: 3.4 mmol/L — AB (ref 3.5–5.1)
SODIUM: 142 mmol/L (ref 135–145)

## 2017-03-08 LAB — CBC
HEMATOCRIT: 39.8 % (ref 36.0–46.0)
HEMOGLOBIN: 12.8 g/dL (ref 12.0–15.0)
MCH: 29.8 pg (ref 26.0–34.0)
MCHC: 32.2 g/dL (ref 30.0–36.0)
MCV: 92.8 fL (ref 78.0–100.0)
Platelets: 246 10*3/uL (ref 150–400)
RBC: 4.29 MIL/uL (ref 3.87–5.11)
RDW: 14.7 % (ref 11.5–15.5)
WBC: 9 10*3/uL (ref 4.0–10.5)

## 2017-03-08 LAB — PHOSPHORUS: Phosphorus: 2.8 mg/dL (ref 2.5–4.6)

## 2017-03-08 LAB — MAGNESIUM: Magnesium: 1.3 mg/dL — ABNORMAL LOW (ref 1.7–2.4)

## 2017-03-08 LAB — MRSA PCR SCREENING: MRSA BY PCR: NEGATIVE

## 2017-03-08 MED ORDER — DIPHENOXYLATE-ATROPINE 2.5-0.025 MG PO TABS: 1.0000 | ORAL_TABLET | ORAL | Status: DC | PRN

## 2017-03-08 MED ORDER — FUROSEMIDE 10 MG/ML IJ SOLN
20.0000 mg | Freq: Two times a day (BID) | INTRAMUSCULAR | Status: DC
  Administered 2017-03-08: 20 mg via INTRAVENOUS
  Filled 2017-03-08: qty 2

## 2017-03-08 MED ORDER — FUROSEMIDE 10 MG/ML IJ SOLN
40.0000 mg | Freq: Three times a day (TID) | INTRAMUSCULAR | Status: DC
  Administered 2017-03-08 – 2017-03-10 (×6): 40 mg via INTRAVENOUS
  Filled 2017-03-08 (×6): qty 4

## 2017-03-08 MED ORDER — FUROSEMIDE 10 MG/ML IJ SOLN: 40.0000 mg | Freq: Two times a day (BID) | INTRAMUSCULAR | Status: DC

## 2017-03-08 MED ORDER — LOPERAMIDE HCL 2 MG PO CAPS: 2.0000 mg | ORAL_CAPSULE | ORAL | Status: DC | PRN

## 2017-03-08 MED ORDER — HYDRALAZINE HCL 20 MG/ML IJ SOLN
5.0000 mg | Freq: Once | INTRAMUSCULAR | Status: AC
  Administered 2017-03-08: 5 mg via INTRAVENOUS
  Filled 2017-03-08: qty 1

## 2017-03-08 NOTE — NC FL2 (Signed)
[place] MEDICAID FL2 LEVEL OF CARE SCREENING TOOL     IDENTIFICATION  Patient Name: [name] MoriHelen Zenz Birthdate: 1928-04-08 Sex: female Admission Date (Current Location): 03/07/2017  [facility] [place]   Facility and Address:  [facility] [place], [address], [place], [place] [address]      Provider Number: [id number]  Attending Physician Name and Address:  Delano MetzSchertz, Man Bonneau, MD  Relative Name and Phone Number:       Current Level of Care: Hospital Recommended Level of Care: Skilled Nursing Facility Prior Approval Number:    Date Approved/Denied:   PASRR Number: [id number]  Discharge Plan: SNF    Current Diagnoses: Patient Active Problem List   Diagnosis Date Noted  . Acute respiratory failure (HCC) 03/07/2017  . Acute on chronic diastolic CHF (congestive heart failure) (HCC) 03/07/2017  . Hyperglycemia 03/07/2017  . Elevated troponin   . Right hip pain 12/11/2016  . Pulmonary hypertension, moderate to severe (HCC) 09/24/2015  . AVN (avascular necrosis of bone) (HCC) 09/23/2015  . Abnormal TSH 09/23/2015  . Hypokalemia 09/23/2015  . Sinus bradycardia 09/23/2015  . Preoperative cardiovascular examination 09/23/2015  . Hyponatremia 09/22/2015  . Acute hyponatremia 09/22/2015  . Fall   . Primary osteoarthritis of right knee 08/29/2013  . Postoperative anemia due to acute blood loss 12/18/2011    Class: Acute  . Hyponatremia with excess extracellular fluid volume 12/18/2011    Class: Acute  . Hip fracture, intertrochanteric, right, closed, initial encounter 12/15/2011    Class: Acute  . Neuropathic pain 10/19/2011  . HTN (hypertension)   . Hyperlipidemia   . VITAMIN D DEFICIENCY 04/19/2010  . Hyperlipemia 04/19/2010  . 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 Essential hypertension 04/19/2010  . CAD - MI/PCI in 2002 05/23/2001    Orientation RESPIRATION BLADDER Height & Weight     Self, Situation, Time, Place  O2 (Nasal Canula 2 L)  Incontinent, External catheter Weight: 125 lb 1.6 oz (56.7 kg) Height:  5\' 4"  (162.6 cm)  BEHAVIORAL SYMPTOMS/MOOD NEUROLOGICAL BOWEL NUTRITION STATUS   (None)  (None) Continent Diet (Regular)  AMBULATORY STATUS COMMUNICATION OF NEEDS Skin     Verbally Normal                       Personal Care Assistance Level of Assistance              Functional Limitations Info  Sight, Hearing, Speech Sight Info: Adequate Hearing Info: Impaired Speech Info: Adequate    SPECIAL CARE FACTORS FREQUENCY  Blood pressure                    Contractures Contractures Info: Not present    Additional Factors Info  Code Status, Allergies Code Status Info: Full Allergies Info: Penicillins           Current Medications (03/08/2017):  This is the current hospital active medication list Current Facility-Administered Medications  Medication Dose Route Frequency Provider Last Rate Last Dose  . 0.9 %  sodium chloride infusion   Intravenous Continuous Cathren LaineSteinl, Kevin, MD      . acetaminophen (TYLENOL) tablet 650 mg  650 mg Oral Q6H PRN Ozella RocksMerrell, David J, MD       Or  . acetaminophen (TYLENOL) suppository 650 mg  650 mg Rectal Q6H PRN Ozella RocksMerrell, David J, MD      . aspirin EC tablet 81 mg  81 mg Oral Daily Ozella RocksMerrell, David J, MD   [id number]  mg at 03/08/17 0944  . atorvastatin (LIPITOR) tablet 20 mg  20 mg Oral Daily Ozella RocksMerrell, David J, MD   20 mg at 03/08/17 0944  . clopidogrel (PLAVIX) tablet 75 mg  75 mg Oral Daily Ozella RocksMerrell, David J, MD   75 mg at 03/08/17 0944  . diphenoxylate-atropine (LOMOTIL) 2.5-0.025 MG per tablet 1 tablet  1 tablet Oral QID PRN Ozella RocksMerrell, David J, MD      . enoxaparin (LOVENOX) injection 40 mg  40 mg Subcutaneous Q24H Ozella RocksMerrell, David J, MD      . furosemide (LASIX) injection 20 mg  20 mg Intravenous BID Ozella RocksMerrell, David J, MD   20 mg at 03/08/17 [id number]  . gabapentin (NEURONTIN) capsule 100 mg  100 mg Oral BID Ozella RocksMerrell, David J, MD   100 mg at 03/08/17 0944  . lisinopril (PRINIVIL,ZESTRIL)  tablet 20 mg  20 mg Oral Daily Ozella RocksMerrell, David J, MD   20 mg at 03/08/17 0944  . loperamide (IMODIUM) capsule 2 mg  2 mg Oral QID PRN Ozella RocksMerrell, David J, MD      . metoprolol succinate (TOPROL-XL) 24 hr tablet 25 mg  25 mg Oral Daily Ozella RocksMerrell, David J, MD   25 mg at 03/08/17 0944  . mirtazapine (REMERON SOL-TAB) disintegrating tablet 7.5 mg  7.5 mg Oral QHS Ozella RocksMerrell, David J, MD      . ondansetron [facility](ZOFRAN) tablet 4 mg  4 mg Oral Q6H PRN Ozella RocksMerrell, David J, MD       Or  . ondansetron [facility](ZOFRAN) injection 4 mg  4 mg Intravenous Q6H PRN Ozella RocksMerrell, David J, MD      . potassium chloride SA (K-DUR,KLOR-CON) CR tablet 40 mEq  40 mEq Oral BID Ozella RocksMerrell, David J, MD   40 mEq at 03/08/17 [id number]     Discharge Medications: Please see discharge summary for a list of discharge medications.  Relevant Imaging Results:  Relevant Lab Results:   Additional Information SS#: [id number]  Margarito LinerSarah C Boswell, LCSW  Vinson Moselleob Geonna Lockyer MD [facility] pgr [phone] 03/08/2017, 12:51 PM

## 2017-03-08 NOTE — Progress Notes (Signed)
Pt continues with hypertension. HS meds and meds not given in ED given after admission to this floor. Pt refusing to take any more meds tonight. Pt also refusing MRSA PCR tonight. Will continue to monitor.

## 2017-03-08 NOTE — Consult Note (Signed)
Cardiology Consult    Patient ID: [name] MoriHelen [name] MRN: [id number], DOB/AGE: January 05, 1928   Admit date: 03/07/2017 Date of Consult: 03/08/2017  Primary Physician: Kristian CoveyBurchette, Bruce W, MD Primary Cardiologist: Dr. Elease HashimotoNahser Requesting Provider: Dr. Arlean HoppingSchertz  Reason for Consult: heart failure  Patient Profile    [name] [name] has a PMH significant for CAD (s/p remote MI/stenting in 2002, nonischemic nuc in 2009 with EF 53%), HTN, HLD, arthritis, neuropathy, sciatica, prior ORIF of right hip fracture, & hearing loss.   [name] [name] is a 83 y.o. female who is being seen today for the evaluation of new systolic heart failure at the request of Dr. Arlean HoppingSchertz.   Past Medical History   Past Medical History:  Diagnosis Date  . Arthritis   . CAD (coronary artery disease)    a. remote MI[place]st[place] in [place] in 2002. b. Nuc 2009: prior inferior MI but no evidence of ischemia, EF 53% with akinesis of inferior wall.  [name]. Hearing loss    a. bilat hearing aids.  . Hip fracture (HCC)    a. s/p prior ORIF.  [name]. HTN (hypertension)   . Hyperlipidemia   . MI (myocardial infarction) (HCC) 2002  . Neuropathy   . Sciatica     Past Surgical History:  Procedure Laterality Date  . ABDOMINAL HYSTERECTOMY    . CATARACT EXTRACTION  1998   RIGHT AND LEFT EYE  . CHOLECYSTECTOMY  2000  . COMPRESSION HIP SCREW  12/16/2011   Procedure: COMPRESSION HIP;  Surgeon: Kerrin ChampagneJames E Nitka, MD;  Location: [facility];  Service: Orthopedics;  Laterality: Right;  . DILATION AND CURETTAGE OF UTERUS  1960  . HYSTRECTOMY    . PTCA  2002  . REFRACTIVE SURGERY  2002     Allergies  Allergies  Allergen Reactions  . Penicillins Swelling    Swelling of hands and face Has patient had a PCN reaction causing immediate rash, facial/tongue/throat swelling, SOB or lightheadedness with hypotension: unknown Has patient had a PCN reaction causing severe rash involving mucus membranes or skin necrosis: no Has patient had a PCN reaction that required  hospitalization: unknown Has patient had a PCN reaction occurring within the last 10 years: No If all of the above answers are "NO", then may proceed with Cephalosporin use.     History of Present Illness    [name] [name] by Dr. Elease HashimotoNahser in 2013 in clinic and was in her usual state of health, but was lost to follow up.  She was seen by our service in 2017 in consult for preoperative cardiac risk stratification.   [facility] with worsening shortness of breath. She reports baseline orthopnea and was hypoxic on presentation to the 70s. She is not currently on home oxygen. She also reports baseline LE swelling and weight gain. She had a fall 1 day prior to admission. She previously was able to transfer from bed to wheelchair with significant effort before the fall.   Echocardiogram revealed new systolic heart failure with reduced EF to 35%, previously normal in 2017.   Of note, today patient was seen with left facial droop, LUE drift, and dysarthria. Code stroke initiated, head CT was negative and symptoms resolved. Code stroke was rescinded.   On my interview, the patient denies SOB, chest pain. She describes a fall earlier this week. She reports leg swelling and increased sleeping during the day. She reports episodes of palpitations when she is having stress.   Inpatient Medications    . aspirin EC  81  mg Oral Daily  . atorvastatin  20 mg Oral Daily  . clopidogrel  75 mg Oral Daily  . enoxaparin (LOVENOX) injection  40 mg Subcutaneous Q24H  . furosemide  40 mg Intravenous Q8H  . gabapentin  100 mg Oral BID  . lisinopril  20 mg Oral Daily  . metoprolol succinate  25 mg Oral Daily  . mirtazapine  7.5 mg Oral QHS  . potassium chloride  40 mEq Oral BID     Outpatient Medications    Prior to Admission medications   Medication Sig Start Date End Date Taking? Authorizing Provider  acetaminophen (TYLENOL) 500 MG tablet Take 1,000 mg by mouth every 6 (six) hours as needed for mild  pain.   Yes [provider]  aspirin EC 81 MG tablet Take 81 mg by mouth daily.   Yes [provider]  atorvastatin (LIPITOR) 20 MG tablet TAKE 1 TABLET DAILY 05/24/15  Yes Burchette, Elberta FortisBruce W, MD  clopidogrel (PLAVIX) 75 MG tablet Take 75 mg by mouth daily.   Yes [provider]  diphenoxylate-atropine (LOMOTIL) 2.5-0.025 MG tablet Take 1 tablet by mouth 4 (four) times daily as needed for diarrhea or loose stools.   Yes [provider]  gabapentin (NEURONTIN) 100 MG capsule TAKE 1 CAPSULE TWICE A DAY Patient taking differently: TAKE 100MG  BY MOUTH TWICE DAILY 02/24/15  Yes Burchette, Elberta FortisBruce W, MD  lisinopril (PRINIVIL,ZESTRIL) 20 MG tablet TAKE 1 TABLET DAILY 05/24/15  Yes Burchette, Elberta FortisBruce W, MD  loperamide (IMODIUM A-D) 2 MG tablet Take 2 mg by mouth 4 (four) times daily as needed for diarrhea or loose stools.   Yes [provider]  metoprolol succinate (TOPROL-XL) 25 MG 24 hr tablet Take 1 tablet (25 mg total) by mouth daily. Take with or immediately following a meal. 09/23/15  Yes Dorothea OgleMyers, Iskra M, MD  mirtazapine (REMERON SOL-TAB) 15 MG disintegrating tablet PLACE 1 TABLET ON TONGUE AT BEDTIME Patient taking differently: PLACE 1 TABLET=7.5MG  ON TONGUE AT BEDTIME 09/23/15  Yes Burchette, Elberta FortisBruce W, MD  pantoprazole (PROTONIX) 40 MG tablet Take 1 tablet (40 mg total) by mouth daily. 03/07/17   Ozella RocksMerrell, David J, MD     Family History    Family History  Problem Relation Age of Onset  . Hypertension Father        DECEASED  . Cancer Sister [phone]       BREAST/BONE CA  . Pneumonia Mother [phone]  . Aneurysm Brother [phone]       DECEASED    Social History    Social History   Social History  . Marital status: Single    Spouse name: N/A  . Number of children: N/A  . Years of education: N/A   Occupational History  . Not on file.   Social History Main Topics  . Smoking status: Never Smoker  . Smokeless tobacco: Never Used  . Alcohol use No  . Drug use: No    . Sexual activity: No   Other Topics Concern  . Not on file   Social History Narrative  . No narrative on file     Review of Systems    General:  No chills, fever, night sweats or weight changes.  Cardiovascular:  No chest pain, dyspnea on exertion, edema, orthopnea, palpitations, paroxysmal nocturnal dyspnea. Dermatological: No rash, lesions/masses Respiratory: No cough, dyspnea Urologic: No hematuria, dysuria Abdominal:   No nausea, vomiting, diarrhea, bright red blood per rectum, melena, or hematemesis Neurologic:  No visual changes, wkns,  changes in mental status. All other systems reviewed and are otherwise negative except as noted above.  Physical Exam    Blood pressure (!) 142/98, pulse 79, temperature 98.2 F (36.8 C), temperature source Oral, resp. rate 16, height 5\' 4"  (1.626 m), weight 125 lb 1.6 oz (56.7 kg), SpO2 97 %.  General: Pleasant, NAD Psych: Normal affect. Neuro: Alert and oriented X 3. Moves all extremities spontaneously. HEENT: Normal  Neck: Supple without bruits or JVD. Lungs:  Resp regular and unlabored, CTA in upper lobes, diminished in bases Heart: RRR no s3, s4, or murmurs. Abdomen: Soft, non-tender, non-distended, BS + x 4.  Extremities: No clubbing, cyanosis, trace edema. DP/PT/Radials 1+ and equal bilaterally.  Labs    Troponin (Point of Care Test)  Recent Labs  03/07/17 1034  TROPIPOC 0.12*   No results for input(s): CKTOTAL, CKMB, TROPONINI in the last 72 hours. Lab Results  Component Value Date   WBC 9.0 03/08/2017   HGB 12.8 03/08/2017   HCT 39.8 03/08/2017   MCV 92.8 03/08/2017   PLT 246 03/08/2017    Recent Labs Lab 03/07/17 1025 03/08/17 0314  NA 140 142  K 3.3* 3.4*  CL 107 105  CO2 26 27  BUN 19 15  CREATININE 1.01* 0.92  CALCIUM 9.7 9.5  PROT 6.2*  --   BILITOT 0.9  --   ALKPHOS 58  --   ALT 14  --   AST 21  --   GLUCOSE 165* 115*   Lab Results  Component Value Date   CHOL 133 02/18/2015   HDL 53.30  02/18/2015   LDLCALC 63 02/18/2015   TRIG 84.0 02/18/2015   No results found for: [facility]   Radiology Studies    Dg Chest Port 1 View  Result Date: 03/07/2017 CLINICAL DATA:  Increasing shortness of breath. Bilateral lower extremity edema. EXAM: PORTABLE CHEST 1 VIEW COMPARISON:  12/15/2011 FINDINGS: The patient has and Kerley B-lines at the right lung base consistent with mild interstitial edema. Heart size is normal. Slight atelectasis at the lung bases. I suspect there small bilateral effusions. No acute bone abnormality. IMPRESSION: Mild interstitial edema with slight atelectasis at the bases. Probable small effusions. Electronically Signed   By: Francene Boyers M.D.   On: 03/07/2017 10:43   Ct Head Code Stroke Wo Contrast  Result Date: 03/08/2017 CLINICAL DATA:  Code stroke. 83 y/o F; left facial droop and weakness, now resolved. EXAM: CT HEAD WITHOUT CONTRAST TECHNIQUE: Contiguous axial images were obtained from the base of the skull through the vertex without intravenous contrast. COMPARISON:  01/06/2016 CT head FINDINGS: Brain: Interval subcentimeter lucency in right thalamus compatible with age-indeterminate lacunar infarction. No evidence for large vascular territory infarct, intracranial hemorrhage, or focal mass effect. Patchy nonspecific foci of hypoattenuation subcortical and periventricular white matter compatible with chronic microvascular ischemic changes and there is parenchymal volume loss stable prior CT. Vascular: Extensive calcific atherosclerosis of vertebrobasilar system and carotid siphons. No hyperdense vessel identified. Skull: Normal. Negative for fracture or focal lesion. Sinuses/Orbits: Bilateral mastoid effusions. Normally aerated paranasal sinuses. Bilateral intra-ocular lens replacement. Other: None. ASPECTS [facility] Stroke Program Early CT Score) - Ganglionic level infarction (caudate, lentiform nuclei, internal capsule, insula, M1-M3 cortex): 7 - Supraganglionic  infarction (M4-M6 cortex): 3 Total score (0-10 with 10 being normal): 10 IMPRESSION: 1. Interval subcentimeter lucency in right thalamus compatible with age-indeterminate lacunar infarction. 2. No acute vascular territory infarction or intracranial hemorrhage. 3. ASPECTS is 10. 4. Stable chronic microvascular ischemic changes  and parenchymal volume loss. These results were called by telephone at the time of interpretation on 03/08/2017 at 5:37 pm to Dr. Arther Dames , who verbally acknowledged these results. Electronically Signed   By: Mitzi Hansen M.D.   On: 03/08/2017 17:45    ECG & Cardiac Imaging    EKG 03/07/17: sinus rhythm, nonspecific T wave changes  Echocardiogram 03/07/17: Study Conclusions - Left ventricle: The cavity size was normal. Wall thickness was   increased in a pattern of mild LVH. Basal to mid inferior   akinesis. Inferolateral akinesis. Basal to mid anterolateral   akinesis. The estimated ejection fraction was 35%. Doppler   parameters are consistent with abnormal left ventricular   relaxation (grade 1 diastolic dysfunction). - Aortic valve: Trileaflet; moderately calcified leaflets.   Sclerosis without stenosis. There was mild regurgitation. - Mitral valve: There was mild regurgitation. - Left atrium: The atrium was mildly to moderately dilated. - Right ventricle: The cavity size was moderately dilated. Systolic   function was mildly to moderately reduced. - Right atrium: The atrium was mildly dilated. - Tricuspid valve: Peak RV-RA gradient (S): 62 mm Hg. - Pulmonic valve: There was moderate to severe regurgitation. - Systemic veins: IVC not visualized.  Impressions: - Normal LV size with mild LV hypertrophy. EF 35% with wall motion   abnormalities as noted above. Aortic sclerosis without   significant stenosis. Moderately dilated RV with mild to   moderately decreased systolic function. Moderate to severe   pulmonic insufficiency (this was noted on  prior echo as well).   Moderate to severe pulmonary hypertension.   Echocardiogram 09/23/15: Study Conclusions - Left ventricle: The cavity size was normal. There was mild   concentric hypertrophy. Systolic function was normal. The   estimated ejection fraction was in the range of 55% to 60%. Wall   motion was normal; there were no regional wall motion   abnormalities. Features are consistent with a pseudonormal left   ventricular filling pattern, with concomitant abnormal relaxation   and increased filling pressure (grade 2 diastolic dysfunction).   Doppler parameters are consistent with high ventricular filling   pressure. - Aortic valve: Moderate thickening. Mild diffuse calcification.   There was mild regurgitation. - Mitral valve: There was mild regurgitation. - Left atrium: The atrium was moderately dilated. - Right ventricle: Systolic function was moderately to severely   reduced. - Pulmonic valve: There was moderate to severe regurgitation. - Pulmonary arteries: PA peak pressure: 70 mm Hg (S).  Impressions: - The right ventricular systolic pressure was increased consistent   with severe pulmonary hypertension.   Assessment & Plan    1. New onset systolic heart failure - Echo yesterday with LVEF of 35% and Basal to mid inferior akinesis. Inferolateral akinesis. Basal to mid anterolateral akinesis. This is a change from echo last year (normal LV function and no wall motion abnormalities.  - BNP on admission 703.6 - current medications include ASA, plavix, statin, torpol, lisinopril - she is not diuresing well on 40 mg IV lasix TID: she is overall net negative 1.2L with modest urine output with sCr normal at 0.92 - agree with lasix, continue daily weights, and strict I&Os - will not proceed with ischemic workup at this time. We will focus on fluid management with plans to follow outpatient.   2. Hypokalemia - increased supplemental K - daily BMP   3. CAD s/p  stenting in [place] - denies chest pain - continue ASA, plavix     Signed, [name] Land  Saundra Shelling, PA-C 03/08/2017, 7:06 PM [phone]  The patient was seen and examined, and I agree with the history, physical exam, assessment and plan as documented above, with modifications as noted below. I have also personally reviewed all relevant documentation, old records, labs, and both radiographic and cardiovascular studies. I have also independently interpreted old and new ECG's.  83 yr old woman with CAD with prior MI and stent admitted with progressive shortness of breath, leg swelling, and hypoxia and found to be in acute systolic heart failure. Most recent LVEF of 35% with numerous wall motion abnormalities is markedly changed from prior echo in 2017 as detailed above. She also has significant valvular heart disease, pulm HTN, and RV dysfunction.  CXR showed mild pulmonary edema and small pleural effusions.  It appears she has received a total of 60 mg of IV Lasix thus far and has had modest urine output.  During my evaluation, she denies chest pain and says both shortness of breath and leg swelling have improved.  She noticed progressive exertional dyspnea beginning about 1 month ago. Denies any chest pain in past month.  Also describes "spacing out" spells. Code stroke called today for one of these episodes deemed to be hypoxic in etiology and not indicative of an acute stroke or seizure as per neurology assessment.  At this time, BP is mildly elevated but better controlled than time of admission.  She is originally from [place], [place].  Recommendations:  I would recommend continued IV diuresis with Lasix at present dose and continued medical management of CAD with ASA, Lipitor, lisinopril, and metoprolol succinate.  Even with CT evidence of age-indeterminate lacunar infarct, there is no indication to be on both ASA and Plavix and would thus treat with ASA alone.  I would not pursue an  inpatient ischemic workup at this time. She is also not interested in invasive studies even if stress testing showed abnormalities (ischemia), which I think is reasonable.   Prentice Docker, MD, [facility]  03/08/2017 8:02 PM

## 2017-03-08 NOTE — Evaluation (Signed)
Physical Therapy Evaluation Patient Details Name: [name] MoriHelen [name] MRN: [id number] DOB: [birth year] Today's Date: 03/08/2017   History of Present Illness  Pt is a 83 yo female brought from SNF with increasing shortness of breath, dx acute respiratory failuremedical history significant of sciatica, peripheral take, CAD/MI, hyperlipidemia, hypertension, hip fracture, hearing loss.  Clinical Impression  Pt admitted with above diagnosis. Pt currently with functional limitations due to the deficits listed below (see PT Problem List). Pt continues to experience O2 desaturation to 86%O2 on RA. Pt currently, modAx2 for bed mobility and unable to transfer sit>stand as she prefers no assistance for power up and is unable to attain upright secondary to muscle weakness and RLE pain. Pt will benefit from skilled PT to increase their independence and safety with mobility to allow discharge to the venue listed below.       Follow Up Recommendations SNF    Equipment Recommendations  None recommended by PT    Recommendations for Other Services OT consult     Precautions / Restrictions Precautions Precautions: Fall Restrictions Weight Bearing Restrictions: No RLE Weight Bearing: Non weight bearing LLE Weight Bearing: Non weight bearing      Mobility  Bed Mobility Overal bed mobility: Needs Assistance Bed Mobility: Supine to Sit;Sit to Supine     Supine to sit: Mod assist Sit to supine: Max assist;+2 for physical assistance   General bed mobility comments: mod A for trunk to upright and pad scoot to EoB  Transfers Overall transfer level: Needs assistance   Transfers: Sit to/from Stand Sit to Stand: From elevated surface;Mod assist         General transfer comment: modA for power up however pt unable to reach up to walker and needed to sit back down, on second attempt pt refused help, PT provided close guard but pt was unable to powerup to standing. Pt unable to be transferred to recliner as  there was none in her room and none available on the floor. Pt would benefit from time out of bed.      Balance Overall balance assessment: Needs assistance Sitting-balance support: Feet supported;No upper extremity supported Sitting balance-Leahy Scale: Fair                                       Pertinent Vitals/Pain Pain Assessment: 0-10 Pain Score: 5  Pain Location: R hip with movement Pain Descriptors / Indicators: Sharp;Shooting;Aching Pain Intervention(s): Limited activity within patient's tolerance;Monitored during session    Home Living Family/patient expects to be discharged to:: Skilled nursing facility                 Additional Comments: [facility]    Prior Function Level of Independence: Needs assistance   Gait / Transfers Assistance Needed: transfers with assist to w/c for mobility in facility, able to ambulate to restroom with assistance and RW  ADL's / Homemaking Assistance Needed: requires assist for ADLs, and iADLs           Extremity/Trunk Assessment   Upper Extremity Assessment Upper Extremity Assessment: Generalized weakness    Lower Extremity Assessment Lower Extremity Assessment: RLE deficits/detail;LLE deficits/detail RLE Deficits / Details: R hip and knee ROM and strength limited by pain from previous hip fx and knee OA RLE: Unable to fully assess due to pain LLE Deficits / Details: knee flexion/extension lacking approx 10 degrees, strength of L LE grossly 2+/5 LLE: Unable to  fully assess due to pain    Cervical / Trunk Assessment Cervical / Trunk Assessment: Kyphotic  Communication   Communication: No difficulties  Cognition Arousal/Alertness: Awake/alert Behavior During Therapy: WFL for tasks assessed/performed;Anxious Overall Cognitive Status: Within Functional Limits for tasks assessed                                        General Comments General comments (skin integrity, edema, etc.):  Pt on 2L O2 via nasal cannula and is 92% O2 Pt reports not using O2 at home so O2 removed for PT session at rest on RA, SaO2 90%O2 and HR 76bpm, with activity SaO2 dropped to 86%O2 and HR rose to 100bpm, 2L O2 via nasal cannula replaced and SaO2 rose to 92%.   Pt states she does not like "closeness" at all and prefers to do everything by herself despite the need of help to increase her mobility.         Assessment/Plan    PT Assessment Patient needs continued PT services  PT Problem List Decreased strength;Decreased range of motion;Decreased activity tolerance;Decreased balance;Decreased mobility;Decreased safety awareness;Cardiopulmonary status limiting activity;Pain       PT Treatment Interventions DME instruction;Gait training;Functional mobility training;Therapeutic exercise;Therapeutic activities;Balance training;Patient/family education;Wheelchair mobility training    PT Goals (Current goals can be found in the Care Plan section)  Acute Rehab PT Goals Patient Stated Goal: [facility] Goal Formulation: With patient Time For Goal Achievement: 03/15/17 Potential to Achieve Goals: Fair    Frequency Min 2X/week    AM-PAC PT "6 Clicks" Daily Activity  Outcome Measure Difficulty turning over in bed (including adjusting bedclothes, sheets and blankets)?: Total Difficulty moving from lying on back to sitting on the side of the bed? : Total Difficulty sitting down on and standing up from a chair with arms (e.g., wheelchair, bedside commode, etc,.)?: Total Help needed moving to and from a bed to chair (including a wheelchair)?: Total Help needed walking in hospital room?: Total Help needed climbing 3-5 steps with a railing? : Total 6 Click Score: 6    End of Session Equipment Utilized During Treatment: Gait belt;Oxygen Activity Tolerance: Patient tolerated treatment well Patient left: in bed;with call bell/phone within reach;with nursing/sitter in room;with bed alarm  set Nurse Communication: Mobility status;Other (comment) (need for recliner to get patient OOB) PT Visit Diagnosis: Repeated falls (R29.6);History of falling (Z91.81);Muscle weakness (generalized) (M62.81);Other abnormalities of gait and mobility (R26.89);Unsteadiness on feet (R26.81);Difficulty in walking, not elsewhere classified (R26.2);Pain Pain - Right/Left: Right Pain - part of body: Hip;Leg    Time: [id number] PT Time Calculation (min) (ACUTE ONLY): 42 min   Charges:   PT Evaluation $PT Eval Moderate Complexity: 1 Mod PT Treatments $Therapeutic Activity: 23-37 mins   PT G Codes:   PT G-Codes **NOT FOR INPATIENT CLASS** Functional Assessment Tool Used: AM-PAC 6 Clicks Basic Mobility Functional Limitation: Mobility: Walking and moving around Mobility: Walking and Moving Around Current Status (O1308(G8978): 100 percent impaired, limited or restricted Mobility: Walking and Moving Around Goal Status (M5784(G8979): At least 60 percent but less than 80 percent impaired, limited or restricted    Lanora ManisElizabeth B. Beverely RisenVan Fleet PT, DPT Acute Rehabilitation  [phone] Pager [phone]    Elon Alaslizabeth B Van Fleet 03/08/2017, 10:26 AM

## 2017-03-08 NOTE — Consult Note (Addendum)
NEURO HOSPITALIST CONSULT NOTE   Requestig physician: Dr. Arlean HoppingSchertz   Reason for Consult: Episode of AMS,    HPI:                                                                                                                                          Ilda MoriHelen Corliss is an 83 y.o. female medical history significant of sciatica, peripheral take, CAD/MI, hyperlipidemia, hypertension, hip fracture, hearing loss admitted for worsening heart failure and orthoopnea. She was stroke alerted following episode of AMS.  At 4.55 pm,family saw patient eating food, and she suddenly becoming drowsy while food was in mouth ans stopped responding. She was still chewing her food. They called her nurse she "looked spaced out", left sided facial droop, LUE drift  and dysarthria. She has improved by the time I arrived and back to neurological baseline. She told me she did not remember the event. The nurse denied she noticed any deviation of her eyes, head turning , oral automatisms.No jerking movements. During event, 02 sats not checked.  She had a similar episode this morning and yesterday as well. On examination she had no weakness different from baseline( she cannot lift left arm due to frozen shoulder and right leg due to hip fracture). She has no history of seizures.   ROS 14 point ROS neg excopt above   Past Medical History:  Diagnosis Date  . Arthritis   . CAD (coronary artery disease)    a. remote MI/stenting in [place] in 2002. b. Nuc 2009: prior inferior MI but no evidence of ischemia, EF 53% with akinesis of inferior wall.  [name]. Hearing loss    a. bilat hearing aids.  . Hip fracture (HCC)    a. s/p prior ORIF.  [name]. HTN (hypertension)   . Hyperlipidemia   . MI (myocardial infarction) (HCC) 2002  . Neuropathy   . Sciatica     Past Surgical History:  Procedure Laterality Date  . ABDOMINAL HYSTERECTOMY    . CATARACT EXTRACTION  1998   RIGHT AND LEFT EYE  . CHOLECYSTECTOMY  2000  .  COMPRESSION HIP SCREW  12/16/2011   Procedure: COMPRESSION HIP;  Surgeon: Kerrin ChampagneJames E Nitka, MD;  Location: [facility];  Service: Orthopedics;  Laterality: Right;  . DILATION AND CURETTAGE OF UTERUS  1960  . HYSTRECTOMY    . PTCA  2002  . REFRACTIVE SURGERY  2002    Family History  Problem Relation Age of Onset  . Hypertension Father        DECEASED  . Cancer Sister [id number]       BREAST/BONE CA  . Pneumonia Mother [phone]  . Aneurysm Brother [id number]       DECEASED    Family History: No strokes at young age, no  family history of seizures  Social History:  reports that she has never smoked. She has never used smokeless tobacco. She reports that she does not drink alcohol or use drugs.  Allergies  Allergen Reactions  . Penicillins Swelling    Swelling of hands and face Has patient had a PCN reaction causing immediate rash, facial/tongue/throat swelling, SOB or lightheadedness with hypotension: unknown Has patient had a PCN reaction causing severe rash involving mucus membranes or skin necrosis: no Has patient had a PCN reaction that required hospitalization: unknown Has patient had a PCN reaction occurring within the last 10 years: No If all of the above answers are "NO", then may proceed with Cephalosporin use.     MEDICATIONS:                                                                                                                     {medication reviewed     Blood pressure (!) 142/98, pulse 79, temperature 98.2 F (36.8 C), temperature source Oral, resp. rate 16, height 5\' 4"  (1.626 m), weight 56.7 kg (125 lb 1.6 oz), SpO2 97 %.   Physical Exam  Constitutional: Appears well-developed and well-nourished.  Psych: Affect appropriate to situation Eyes: No scleral injection HENT: No OP obstrucion Head: Normocephalic.  Cardiovascular: Normal rate and regular rhythm.  Respiratory: Effort normal and breath sounds normal to anterior ascultation GI: Soft.  No distension. There is no  tenderness.  Skin: WDI    Neuro:  CN: Pupils are equal and round. T EOMI without nystagmus. Facial sensation is intact to light touch. Face is symmetric at rest with normal strength and mobility. Hearing is intact to conversational voice. Palate elevates symmetrically and uvula is midline. Voice is normal in tone, pitch and quality.  Tongue is midline with normal bulk and mobility.  Motor: Normal bulk, tone, and strength. 4/5 in LUE and 4/5 in RUE, limited by pain. Good hand grip on both UE. Good plantar flexion strength in both lower extremities.   Sensation: Intact to light touch.  DTRs: 2+, symmetric  Toes downgoing bilaterally. No pathologic reflexes.  Coordination: Finger-to-nose and heel-to-shin are without dysmetria    NIHSS 3  ASSESSMENT AND PLAN  1. Episode of altered awareness - symptoms not consistent with a stoke, likely from hypoxia and less likely seizure - stat CT head showed no acute abnormality  - recommend routine EEG - if patient has another episode, please check 02 stats,vitals immediately - do not recommend  AEDs at this time.     Georgiana SpinnerSushanth Eunice Oldaker MD [facility] [phone]  If 7pm to 7am, please call on call as listed on AMION.

## 2017-03-08 NOTE — Evaluation (Signed)
Occupational Therapy Evaluation Patient Details Name: [name] [name] MRN: [id number] DOB: 23-Apr-1928 Today's Date: 03/08/2017    History of Present Illness Pt is a 83 yo female brought from SNF with increasing shortness of breath, dx acute respiratory failuremedical history significant of sciatica, peripheral take, CAD/MI, hyperlipidemia, hypertension, hip fracture, hearing loss.   Clinical Impression   Pt reports she was managing some BADL on her own PTA; but was requiring assist with most ADL from staff at SNF. Currently pt requires mod-max assist overall for ADL. Attempted multiple sit to stand from EOB; pt unable to clear bottom off bed. DOE 2/4; SpO2 >92% on RA throughout activity but reapplied supplemental O2 at end of session. Recommend return to SNF upon d/c. Pt would benefit from continued skilled OT to address established goals.    Follow Up Recommendations  SNF;Supervision/Assistance - 24 hour    Equipment Recommendations  Other (comment) (TBD at next venue)    Recommendations for Other Services       Precautions / Restrictions Precautions Precautions: Fall Restrictions Weight Bearing Restrictions: No RLE Weight Bearing: Non weight bearing LLE Weight Bearing: Non weight bearing      Mobility Bed Mobility Overal bed mobility: Needs Assistance Bed Mobility: Supine to Sit;Sit to Supine     Supine to sit: Mod assist Sit to supine: Max assist   General bed mobility comments: Assist for LEs to EOB and light assist for trunk elevation. Assist to scoot hips to EOB. Max assist for return to bed with cues for sequencing.  Transfers Overall transfer level: Needs assistance Equipment used: Rolling walker (2 wheeled) Transfers: Sit to/from Stand Sit to Stand: From elevated surface         General transfer comment: Attempted sit to stand from EOB x3. Pt unable to power up into standing. Unable to clear bottom off bed    Balance Overall balance assessment: Needs  assistance Sitting-balance support: Feet supported;Bilateral upper extremity supported Sitting balance-Leahy Scale: Fair                                     ADL either performed or assessed with clinical judgement   ADL Overall ADL's : Needs assistance/impaired Eating/Feeding: Minimal assistance;Sitting   Grooming: Moderate assistance;Sitting   Upper Body Bathing: Moderate assistance;Sitting   Lower Body Bathing: Total assistance   Upper Body Dressing : Minimal assistance;Sitting   Lower Body Dressing: Total assistance                 General ADL Comments: Attempted sit to stand at EOB; pt unable to clear bottom off bed with multiple attempts SpO2 >92% on RA throughout session. Reapplied supplemental O2 at end of session; DOE 2/4.     Vision         Perception     Praxis      Pertinent Vitals/Pain Pain Assessment: Faces Pain Score: 5  Faces Pain Scale: Hurts even more Pain Location: R hip, buttocks Pain Descriptors / Indicators: Aching;Grimacing;Guarding Pain Intervention(s): Monitored during session;Limited activity within patient's tolerance     Hand Dominance     Extremity/Trunk Assessment Upper Extremity Assessment Upper Extremity Assessment: Generalized weakness   Lower Extremity Assessment Lower Extremity Assessment: Defer to PT evaluation    Cervical / Trunk Assessment Cervical / Trunk Assessment: Kyphotic   Communication Communication Communication: No difficulties   Cognition Arousal/Alertness: Awake/alert Behavior During Therapy: WFL for tasks assessed/performed;Anxious Overall Cognitive Status:  Within Functional Limits for tasks assessed                                     General Comments      Exercises     Shoulder Instructions      Home Living Family/patient expects to be discharged to:: Skilled nursing facility                                 [facility]      Prior Functioning/Environment Level of Independence: Needs assistance  Gait / Transfers Assistance Needed: transfers with assist to w/c for mobility in facility, able to ambulate to restroom with assistance and RW ADL's / Homemaking Assistance Needed: requires assist for ADLs, and iADLs            OT Problem List: Decreased strength;Decreased range of motion;Decreased activity tolerance;Impaired balance (sitting and/or standing);Decreased knowledge of precautions;Decreased knowledge of use of DME or AE;Cardiopulmonary status limiting activity;Pain;Increased edema      OT Treatment/Interventions: Self-care/ADL training;Therapeutic exercise;Energy conservation;DME and/or AE instruction;Therapeutic activities;Patient/family education;Balance training    OT Goals(Current goals can be found in the care plan section) Acute Rehab OT Goals Patient Stated Goal[facility] Goal Formulation: With patient Time For Goal Achievement: 03/22/17 Potential to Achieve Goals: Good ADL Goals Pt Will Perform Grooming: with min guard assist;sitting Pt Will Perform Upper Body Bathing: with min guard assist;sitting Pt Will Perform Lower Body Bathing: with min assist;sit to/from stand Pt Will Transfer to Toilet: with min assist;stand pivot transfer;bedside commode Pt Will Perform Toileting - Clothing Manipulation and hygiene: with min guard assist;sitting/lateral leans  OT Frequency: Min 2X/week   Barriers to D/C:            Co-evaluation              AM-PAC PT "6 Clicks" Daily Activity     Outcome Measure Help from another person eating meals?: A Little Help from another person taking care of personal grooming?: A Lot Help from another person toileting, which includes using toliet, bedpan, or urinal?: Total Help from another person bathing (including washing, rinsing, drying)?: A Lot Help from another person to put on and taking off regular upper body clothing?:  A Lot Help from another person to put on and taking off regular lower body clothing?: Total 6 Click Score: 11   End of Session Equipment Utilized During Treatment: Gait belt;Rolling walker;Oxygen  Activity Tolerance: Patient tolerated treatment well Patient left: in bed;with call bell/phone within reach;with bed alarm set  OT Visit Diagnosis: Unsteadiness on feet (R26.81);Other abnormalities of gait and mobility (R26.89);History of falling (Z91.81);Muscle weakness (generalized) (M62.81);Pain Pain - Right/Left: Right Pain - part of body: Hip                Time: [id number] OT Time Calculation (min): 30 min Charges:  OT General Charges $OT Visit: 1 Procedure OT Evaluation $OT Eval Moderate Complexity: 1 Procedure OT Treatments $Self Care/Home Management : 8-22 mins G-Codes: OT G-codes **NOT FOR INPATIENT CLASS** Functional Assessment Tool Used: AM-PAC 6 Clicks Daily Activity Functional Limitation: Self care Self Care Current Status (F6213(G8987): At least 60 percent but less than 80 percent impaired, limited or restricted Self Care Goal Status (Y8657(G8988): At least 20 percent but less than 40 percent impaired, limited or restricted  [name] [name], M.S., OTR/L Pager: [phone]  [name] [name] 03/08/2017, 10:53 AM

## 2017-03-08 NOTE — Progress Notes (Signed)
Family members called RN to room at 1655, they said pt was having seizure, when RN coming to the room and checked pt noticed that left side of hand is drooping and left side of face is asymmetrical. Pt could need speak clearly and looked dazed. Family stated that pt was last normal at 1654. Upon assessment, pt returned to baseline, with face symmetrical. Called RRN and Code Stroke.   RRN at bedside at this time.

## 2017-03-08 NOTE — Plan of Care (Signed)
Problem: Food- and Nutrition-Related Knowledge Deficit (NB-1.1) Goal: Nutrition education Formal process to instruct or train a patient/client in a skill or to impart knowledge to help patients/clients voluntarily manage or modify food choices and eating behavior to maintain or improve health. Outcome: Adequate for Discharge Nutrition Education Note  RD consulted for nutrition education regarding new onset CHF.  RD provided "Low Sodium Nutrition Therapy" handout from the Academy of [employer]etics. Reviewed patient's dietary recall. Provided examples on ways to decrease sodium intake in diet. Discouraged intake of processed foods and use of salt shaker. Encouraged fresh fruits and vegetables as well as whole grain sources of carbohydrates to maximize fiber intake.   RD discussed why it is important for patient to adhere to diet recommendations, and emphasized the role of fluids, foods to avoid, and importance of weighing self daily. Teach back method used.  Expect fair compliance.  Body mass index is 21.47 kg/m. Pt meets criteria for normal weight based on current BMI.  Current diet order is regular, patient is consuming approximately 20-50% of meals at this time. Labs and medications reviewed. No further nutrition interventions warranted at this time. RD contact information provided. If additional nutrition issues arise, please re-consult RD.   [name] AnoWilliam M. Ludella Pranger, MS, RD LDN Inpatient Clinical Dietitian Pager [phone]

## 2017-03-08 NOTE — Progress Notes (Signed)
MRSA PCR collected 

## 2017-03-08 NOTE — Code Documentation (Signed)
83 y.o. Female with PMHx of CAD, HLD, HTN and MI who was admitted on 03/07/17 for leg swelling and SOB. Today her bedside RN states she was normal at 16:54 and then when she returned at 16:55, she "looked spaced out", left sided facial droop, LUE drift and dysarthria. Code stroke was called. Stroke team to bedside. At this point, all deficits have been stated to have improved. NIHSS 1 for mild dysarthria. No focal deficits were appreciated. CT negative for acute vascular territory infarction or intracranial hemorrhage. IV tPA not given d/t being too mild to treat. Code stroke canceled. 3E bedside handoff with 3E RN Marolyn Hallerika

## 2017-03-08 NOTE — Progress Notes (Addendum)
[facility] Progress Note   Subjective: 950 cc out yest and 650 cc out today.  BP's 150's/ 80-90.  NO new c/o.  Denies sig SOB.  Has been very tired the last few weeks, no CP.  Had MI yrs ago w/ stent placed by Dr Herbie BaltimoreHarding.  EF here is 30%.  No hx of CHF she is aware of.    Vitals:   03/08/17 0438 03/08/17 0722 03/08/17 0944 03/08/17 1100  BP: (!) 192/99 (!) 160/65 (!) 150/80 131/66  Pulse: 62 68  73  Resp: 19   16  Temp: 99 F (37.2 C)   98.2 F (36.8 C)  TempSrc: Oral   Oral  SpO2: 97% 97%  97%  Weight:      Height:        Inpatient medications: . aspirin EC  81 mg Oral Daily  . atorvastatin  20 mg Oral Daily  . clopidogrel  75 mg Oral Daily  . enoxaparin (LOVENOX) injection  40 mg Subcutaneous Q24H  . furosemide  40 mg Intravenous BID  . gabapentin  100 mg Oral BID  . lisinopril  20 mg Oral Daily  . metoprolol succinate  25 mg Oral Daily  . mirtazapine  7.5 mg Oral QHS  . potassium chloride  40 mEq Oral BID   . sodium chloride     acetaminophen **OR** acetaminophen, diphenoxylate-atropine, loperamide, ondansetron **OR** ondansetron (ZOFRAN) IV  Exam: Alert, no distress, elderly WF No jvd or bruits Chest soft bibasilar crackles, o/w clear RRR no mrg ABd soft nttnd obese no ascites, no hsm Ext trace edema bilat LE's, no wounds or ulcers NF, ox 3  8/1 CXR - IS edema, mild      Impression: 1. Acute systolic heart failure - EF 35%, new dx I believe. Last echo here from Feb '17 showed normal EF. Pt not a good candidate for invasive procedures, have d/w patient and she agrees. Plan for medical magnagement.  Will ask cardiology to see.  Not diuresing much, will ^lasix. On asa/ plavix.  No obvious vol overload on exam.  2. HTN - on acei/ BB here as at home, BP's normal to high  3. SP fall  4. Hx hip fx 2013 w ORIF 5. Debility - chronic, in SNF last 3-4 yrs, uses WC and walker and feeds/ clothes herself.  6. EOL - discussed w/ pt, she doesn't want any heroic  measures, will write for DNR.   7. DJD 8. Hx CAD/ MI / stent - in 2002    Plan - as above   Vinson Moselleob Kiyana Vazguez MD [facility] pgr [phone] 03/08/2017, 7:27 PM    Recent Labs Lab 03/07/17 1025 03/08/17 0314  NA 140 142  K 3.3* 3.4*  CL 107 105  CO2 26 27  GLUCOSE 165* 115*  BUN 19 15  CREATININE 1.01* 0.92  CALCIUM 9.7 9.5  PHOS  --  2.8    Recent Labs Lab 03/07/17 1025  AST 21  ALT 14  ALKPHOS 58  BILITOT 0.9  PROT 6.2*  ALBUMIN 4.0    Recent Labs Lab 03/07/17 1025 03/07/17 2139 03/08/17 0314  WBC 8.7 9.6 9.0  HGB 13.0 12.9 12.8  HCT 39.7 39.8 39.8  MCV 91.9 92.6 92.8  PLT 258 254 246   Iron/TIBC/Ferritin/ %Sat No results found for: IRON, TIBC, FERRITIN, IRONPCTSAT

## 2017-03-08 NOTE — Clinical Social Work Note (Signed)
Clinical Social Work Assessment  Patient Details  Name: [name] [name] MRN: [id number] Date of Birth: 12-14-1927  Date of referral:  03/08/17               Reason for consult:  Discharge Planning                Permission sought to share information with:  Chartered certified accountant granted to share information::  Yes, Verbal Permission Granted  Name::        Agency::  [facility]  Relationship::     Contact Information:     Housing/Transportation Living arrangements for the past 2 months:  [place] of Information:  Patient, Scientist, water quality, Facility Patient Interpreter Needed:  None Criminal Activity/Legal Involvement Pertinent to Current Situation/Hospitalization:  No - Comment as needed Significant Relationships:  Other Family Members Lives with:  Facility Resident Do you feel safe going back to the place where you live?  Yes Need for family participation in patient care:  Yes (Comment)  Care giving concerns:  Patient is a long-term resident at [facility] on their SNF side.   Social Worker assessment / plan:  CSW met with patient. No supports at bedside. CSW introduced role and explained that discharge planning would be discussed. Patient confirmed that she was admitted from [facility] and plans to return once stable for discharge. CSW confirmed with admissions coordinator that patient is a long-term resident on their SNF side. No further concerns. CSW encouraged patient to contact CSW as needed. CSW will continue to follow patient for support and facilitate discharge back to SNF once medically stable.  Employment status:  Retired Nurse, adult PT Recommendations:  Not assessed at this time Information / Referral to community resources:  [place]  Patient/Family's Response to care:  Patient agreeable to return to SNF. Patient's family supportive and involved in patient's care. Patient appreciated  social work intervention.  Patient/Family's Understanding of and Emotional Response to Diagnosis, Current Treatment, and Prognosis:  Patient has a good understanding of the reason for admission and her need to return to SNF once medically stable. Patient appears happy with hospital care.  Emotional Assessment Appearance:  Appears stated age Attitude/Demeanor/Rapport:  Other (Pleasant) Affect (typically observed):  Accepting, Appropriate, Calm, Pleasant Orientation:  Oriented to Self, Oriented to Place, Oriented to  Time, Oriented to Situation Alcohol / Substance use:  Never Used Psych involvement (Current and /or in the community):  No (Comment)  Discharge Needs  Concerns to be addressed:  Care Coordination Readmission within the last 30 days:  No Current discharge risk:  None Barriers to Discharge:  Continued Medical Work up   Candie Chroman, LCSW 03/08/2017, 10:18 AM

## 2017-03-09 ENCOUNTER — Inpatient Hospital Stay (HOSPITAL_COMMUNITY): Payer: Medicare Other

## 2017-03-09 DIAGNOSIS — I5042 Chronic combined systolic (congestive) and diastolic (congestive) heart failure: Secondary | ICD-10-CM

## 2017-03-09 DIAGNOSIS — W19XXXA Unspecified fall, initial encounter: Secondary | ICD-10-CM

## 2017-03-09 DIAGNOSIS — E876 Hypokalemia: Secondary | ICD-10-CM

## 2017-03-09 DIAGNOSIS — R404 Transient alteration of awareness: Secondary | ICD-10-CM

## 2017-03-09 DIAGNOSIS — I5043 Acute on chronic combined systolic (congestive) and diastolic (congestive) heart failure: Secondary | ICD-10-CM

## 2017-03-09 DIAGNOSIS — R531 Weakness: Secondary | ICD-10-CM

## 2017-03-09 DIAGNOSIS — E785 Hyperlipidemia, unspecified: Secondary | ICD-10-CM

## 2017-03-09 DIAGNOSIS — R739 Hyperglycemia, unspecified: Secondary | ICD-10-CM

## 2017-03-09 DIAGNOSIS — I10 Essential (primary) hypertension: Secondary | ICD-10-CM

## 2017-03-09 DIAGNOSIS — J9601 Acute respiratory failure with hypoxia: Secondary | ICD-10-CM

## 2017-03-09 LAB — BASIC METABOLIC PANEL
Anion gap: 11 (ref 5–15)
BUN: 26 mg/dL — AB (ref 6–20)
CHLORIDE: 103 mmol/L (ref 101–111)
CO2: 27 mmol/L (ref 22–32)
Calcium: 9.8 mg/dL (ref 8.9–10.3)
Creatinine, Ser: 1.02 mg/dL — ABNORMAL HIGH (ref 0.44–1.00)
GFR calc Af Amer: 56 mL/min — ABNORMAL LOW (ref >60)
GFR calc non Af Amer: 48 mL/min — ABNORMAL LOW (ref >60)
GLUCOSE: 115 mg/dL — AB (ref 65–99)
POTASSIUM: 4.6 mmol/L (ref 3.5–5.1)
Sodium: 141 mmol/L (ref 135–145)

## 2017-03-09 LAB — MAGNESIUM: Magnesium: 1.4 mg/dL — ABNORMAL LOW (ref 1.7–2.4)

## 2017-03-09 MED ORDER — GADOBENATE DIMEGLUMINE 529 MG/ML IV SOLN
10.0000 mL | Freq: Once | INTRAVENOUS | Status: AC
  Administered 2017-03-09: 10 mL via INTRAVENOUS

## 2017-03-09 MED ORDER — MAGNESIUM SULFATE 2 GM/50ML IV SOLN
2.0000 g | Freq: Once | INTRAVENOUS | Status: AC
  Administered 2017-03-09: 2 g via INTRAVENOUS
  Filled 2017-03-09: qty 50

## 2017-03-09 NOTE — Progress Notes (Signed)
Subjective: Laying in bed on my visit. Family at bedside. No nurse or family report of another staring episode since yesterday.  Current Pertinent Medications: Aspirin 81mg  daily Gabapentin 100mg  BID Remeron 7.5mg  qhs  Pertinent Labs/Diagnostics: CT head 03/08/17: No acute abnormality EEG 03/09/17: This predominantly drowsy and asleep EEG is abnormal due to independent focal slowing over the bilateral temporal regions, right greater than left seen during sleep.  Physical Examination: Vitals:   03/09/17 1000 03/09/17 1300  BP: 124/62 128/60  Pulse: 72 65  Resp:  18  Temp:  98 F (36.7 C)    General: WDWN female.  HEENT:  Normocephalic, no lesions, without obvious abnormality.  Normal external eye and conjunctiva.  Normal external ears. Normal external nose, mucus membranes and septum.  Normal pharynx. Cardiovascular: S1, S2 normal, pulses palpable throughout   Pulmonary: Chest clear, unlabored breathing Abdomen: Soft, non-tender Extremities: no joint deformities, effusion, or inflammation and no edema Musculoskeletal: Tone and bulk normal throughout; no atrophy noted  Neurological Examination:  CN: Pupils are equal, round and symmetrically reactive from 3-->2 mm. EOMI without nystagmus. Facial sensation is intact to light touch. Face is symmetric at rest with normal strength and mobility. Hearing is intact to loud voice. Palate elevates symmetrically and uvula is midline. Voice is normal in tone, pitch and quality. Bilateral SCM and trapezii are 5/5. Tongue is midline with normal bulk and mobility.  Motor: Normal bulk, tone, and strength. 5/5 throughout RUE and hand strength BUE; Lifts ankles off bed bilaterally. No drift.  Sensation: Intact to light touch.  DTRs: 2+, symmetric throughout. Coordination: Finger-to-nose without dysmetria on the right   Assessment: [name] is an 83 year old female who [facility] with worsening orthopnea and heart failure had two episodes  of "staring" when she became non-responsive although consious. In the most recent episode, she continued chewing her food and looked, to her daughter, as though she was going to pass out. [name] has a recollection of the event.  EEG showed temporal focal slowing bilaterally and CT without abnormality.   Recommendations: 1) MRI with/without 2) Per previous note, if she has another episode, STAT Sp02 needed    Ruthine DoseJamie Aldridge PA-C [facility] [phone]  03/09/2017, 3:04 PM  NEUROHOSPITALIST ADDENDUM Seen and examined the patient this AM. Formulated plan as documented above.  Spell EEG findings were nonspecific. Likely hypoxic event rather than seizures.MRI was also done and negative.  Will sign off.   Georgiana SpinnerSushanth Josephene Marrone MD [facility] [phone]  If 7pm to 7am, please call on call as listed on AMION.

## 2017-03-09 NOTE — Progress Notes (Signed)
Progress Note  Patient Name: [name] [name] Date of Encounter: 03/09/2017  Primary Cardiologist: Dr Herbie BaltimoreHarding (2017)  Patient Profile     83 y.o. female w/ hx MI/stenting in 2002, nonischemic nuc in 2009 with EF 53%, HTN, HLD, arthritis, neuropathy, sciatica, prior ORIF of right hip fracture, & hearing loss. Pt admitted 08/01 w/ LE edema, sob, CHF (new dx). Brief episode L facial droop, sx resolved before TPA could be given.  Subjective   Denies SOB, wants to go home. Is in a wheelchair most of the time, also uses a walker. No more neurologic sx.  Inpatient Medications    Scheduled Meds: . aspirin EC  81 mg Oral Daily  . atorvastatin  20 mg Oral Daily  . clopidogrel  75 mg Oral Daily  . enoxaparin (LOVENOX) injection  40 mg Subcutaneous Q24H  . furosemide  40 mg Intravenous Q8H  . gabapentin  100 mg Oral BID  . lisinopril  20 mg Oral Daily  . metoprolol succinate  25 mg Oral Daily  . mirtazapine  7.5 mg Oral QHS  . potassium chloride  40 mEq Oral BID   Continuous Infusions: . sodium chloride     PRN Meds: acetaminophen **OR** acetaminophen, loperamide **OR** diphenoxylate-atropine, ondansetron **OR** ondansetron (ZOFRAN) IV   Vital Signs    Vitals:   03/08/17 1713 03/08/17 2132 03/09/17 0615 03/09/17 1000  BP: (!) 142/98 (!) 125/58 (!) 165/79 124/62  Pulse: 79 74 60 72  Resp: 16 17 18    Temp:  98.1 F (36.7 C) 98.1 F (36.7 C)   TempSrc:  Oral Oral   SpO2: 97% 98% 95% 94%  Weight:  122 lb 4.8 oz (55.5 kg) 119 lb 11.4 oz (54.3 kg)   Height:        Intake/Output Summary (Last 24 hours) at 03/09/17 1206 Last data filed at 03/09/17 [id number]  Gross per 24 hour  Intake              360 ml  Output             2001 ml  Net            -1641 ml   Filed Weights   03/07/17 2127 03/08/17 2132 03/09/17 0615  Weight: 125 lb 1.6 oz (56.7 kg) 122 lb 4.8 oz (55.5 kg) 119 lb 11.4 oz (54.3 kg)    Telemetry    SR, no sig ectopy - Personally Reviewed  ECG    n/a -  Personally Reviewed  Physical Exam   General: Well developed, well nourished, female appearing in no acute distress. Head: Normocephalic, atraumatic.  Neck: Supple without bruits, JVD minimally elevated. Lungs:  Resp regular and unlabored, decreased BS R, some basilar rales L Heart: RRR, S1, S2, no S3, S4, or murmur; no rub. Abdomen: Soft, non-tender, non-distended with normoactive bowel sounds. No hepatomegaly. No rebound/guarding. No obvious abdominal masses. Extremities: No clubbing, cyanosis, no edema. Distal pedal pulses are 2+ bilaterally. Neuro: Alert and oriented X 3. Moves all extremities spontaneously. Psych: Normal affect.  Labs    Hematology Recent Labs Lab 03/07/17 1025 03/07/17 2139 03/08/17 0314  WBC 8.7 9.6 9.0  RBC 4.32 4.30 4.29  HGB 13.0 12.9 12.8  HCT 39.7 39.8 39.8  MCV 91.9 92.6 92.8  MCH 30.1 30.0 29.8  MCHC 32.7 32.4 32.2  RDW 14.5 14.9 14.7  PLT 258 254 246    Chemistry Recent Labs Lab 03/07/17 1025 03/08/17 0314 03/09/17 0422  NA 140 142 141  K 3.3* 3.4* 4.6  CL 107 105 103  CO2 26 27 27   GLUCOSE 165* 115* 115*  BUN 19 15 26*  CREATININE 1.01* 0.92 1.02*  CALCIUM 9.7 9.5 9.8  PROT 6.2*  --   --   ALBUMIN 4.0  --   --   AST 21  --   --   ALT 14  --   --   ALKPHOS 58  --   --   BILITOT 0.9  --   --   GFRNONAA 49* 54* 48*  GFRAA 56* >60 56*  ANIONGAP 7 10 11      Cardiac Enzymes  Recent Labs Lab 03/07/17 1034  TROPIPOC 0.12*     BNP Recent Labs Lab 03/07/17 1025  BNP 703.6*     Radiology    Dg Chest Port 1 View  Result Date: 03/07/2017 CLINICAL DATA:  Increasing shortness of breath. Bilateral lower extremity edema. EXAM: PORTABLE CHEST 1 VIEW COMPARISON:  12/15/2011 FINDINGS: The patient has and Kerley B-lines at the right lung base consistent with mild interstitial edema. Heart size is normal. Slight atelectasis at the lung bases. I suspect there small bilateral effusions. No acute bone abnormality. IMPRESSION: Mild  interstitial edema with slight atelectasis at the bases. Probable small effusions. Electronically Signed   By: Francene Boyers M.D.   On: 03/07/2017 10:43   Ct Head Code Stroke Wo Contrast  Result Date: 03/08/2017 CLINICAL DATA:  Code stroke. 83 y/o F; left facial droop and weakness, now resolved. EXAM: CT HEAD WITHOUT CONTRAST TECHNIQUE: Contiguous axial images were obtained from the base of the skull through the vertex without intravenous contrast. COMPARISON:  01/06/2016 CT head FINDINGS: Brain: Interval subcentimeter lucency in right thalamus compatible with age-indeterminate lacunar infarction. No evidence for large vascular territory infarct, intracranial hemorrhage, or focal mass effect. Patchy nonspecific foci of hypoattenuation subcortical and periventricular white matter compatible with chronic microvascular ischemic changes and there is parenchymal volume loss stable prior CT. Vascular: Extensive calcific atherosclerosis of vertebrobasilar system and carotid siphons. No hyperdense vessel identified. Skull: Normal. Negative for fracture or focal lesion. Sinuses/Orbits: Bilateral mastoid effusions. Normally aerated paranasal sinuses. Bilateral intra-ocular lens replacement. Other: None. ASPECTS [facility] Stroke Program Early CT Score) - Ganglionic level infarction (caudate, lentiform nuclei, internal capsule, insula, M1-M3 cortex): 7 - Supraganglionic infarction (M4-M6 cortex): 3 Total score (0-10 with 10 being normal): 10 IMPRESSION: 1. Interval subcentimeter lucency in right thalamus compatible with age-indeterminate lacunar infarction. 2. No acute vascular territory infarction or intracranial hemorrhage. 3. ASPECTS is 10. 4. Stable chronic microvascular ischemic changes and parenchymal volume loss. These results were called by telephone at the time of interpretation on 03/08/2017 at 5:37 pm to Dr. Arther Dames , who verbally acknowledged these results. Electronically Signed   By: Mitzi Hansen M.D.   On: 03/08/2017 17:45     Cardiac Studies   Echocardiogram 03/07/17: Study Conclusions - Left ventricle: The cavity size was normal. Wall thickness was increased in a pattern of mild LVH. Basal to mid inferior akinesis. Inferolateral akinesis. Basal to mid anterolateral akinesis. The estimated ejection fraction was 35%. Doppler parameters are consistent with abnormal left ventricular relaxation (grade 1 diastolic dysfunction). - Aortic valve: Trileaflet; moderately calcified leaflets. Sclerosis without stenosis. There was mild regurgitation. - Mitral valve: There was mild regurgitation. - Left atrium: The atrium was mildly to moderately dilated. - Right ventricle: The cavity size was moderately dilated. Systolic function was mildly to moderately reduced. - Right atrium: The atrium was mildly  dilated. - Tricuspid valve: Peak RV-RA gradient (S): 62 mm Hg. - Pulmonic valve: There was moderate to severe regurgitation. - Systemic veins: IVC not visualized. Impressions: - Normal LV size with mild LV hypertrophy. EF 35% with wall motion abnormalities as noted above. Aortic sclerosis without significant stenosis. Moderately dilated RV with mild to moderately decreased systolic function. Moderate to severe pulmonic insufficiency (this was noted on prior echo as well). Moderate to severe pulmonary hypertension.   Patient Profile     83 y.o. female w/ hx MI/stenting in 2002, nonischemic nuc in 2009 with EF 53%, HTN, HLD, arthritis, neuropathy, sciatica, prior ORIF of right hip fracture, & hearing loss. Pt admitted 08/01 w/ LE edema, sob, CHF (new dx). Brief episode L facial droop, sx resolved before TPA could be given.   Assessment & Plan    1. Acute combined systolic and diastolic CHF: - Sx have improved and weight is down 6 lbs, I/O net neg 2.8 L - however, pt still w/ decreased BS R base.  - MD to assess pt and decide if repeat CXR or  decubitus film needed.  2. Cardiomyopathy - EF newly down to 35%, +WMA>>probable ICM - pt prefers med rx, not having ischemic sx - she is on ASA, Plavix, ACE, BB, statin. These are all home meds. - BP elevated on admission, improved w/ diuresis - pt not on diuretic PTA, would add oral once IV rx no longer needed.  - follow closely as outpt, add spiro if BP will allow.  Otherwise, per IM Active Problems:   Essential hypertension   Hyperlipidemia   Neuropathic pain   Fall   Acute respiratory failure (HCC)   Acute on chronic diastolic CHF (congestive heart failure) (HCC)   Hyperglycemia    Signed, Theodore DemarkBarrett, Rhonda , PA-C 12:06 PM 03/09/2017 Pager: [phone]   Attending Note:   The patient was seen and examined.  Agree with assessment and plan as noted above.  Changes made to the above note as needed.  Patient seen and independently examined with Theodore Demarkhonda Barrett, PA .   We discussed all aspects of the encounter. I agree with the assessment and plan as stated above.  1.   Chronic systolic CHF:   Not in acute distress. VS are very good.  I'm not sure if we can add much to what she is on Will get a CXR.  Possible DC in the next day or so    I have spent a total of 40 minutes with patient reviewing hospital  notes , telemetry, EKGs, labs and examining patient as well as establishing an assessment and plan that was discussed with the patient. > 50% of time was spent in direct patient care.    Vesta MixerPhilip J. Jaxson Keener, Montez HagemanJr., MD, [facility] Clinic[facility] 03/09/2017, 1:13 PM [address],  [address] Office [phone] Pager [phone]

## 2017-03-09 NOTE — Progress Notes (Signed)
EEG completed, results pending. 

## 2017-03-09 NOTE — Progress Notes (Signed)
Pt  Is alert and oriented no distress and plan to wean cardiologist sign off

## 2017-03-09 NOTE — Plan of Care (Signed)
Problem: Activity: Goal: Capacity to carry out activities will improve Outcome: Progressing Sitting on side of bed with max assist.

## 2017-03-09 NOTE — Procedures (Signed)
ELECTROENCEPHALOGRAM REPORT  Date of Study: 03/09/2017  Patient's Name: [name] MoriHelen Sturdevant MRN: [id number] Date of Birth: 1928-03-02  Referring Provider: Dr. Edsel PetrinMaryann Mikhail  Clinical History: This is an 83 year old woman with a transient episode of "looking spaced out," left-sided weakness.  Medications: gabapentin (NEURONTIN) capsule 100 mg  acetaminophen (TYLENOL) tablet 650 mg  aspirin EC tablet 81 mg  atorvastatin (LIPITOR) tablet 20 mg  clopidogrel (PLAVIX) tablet 75 mg  diphenoxylate-atropine (LOMOTIL) 2.5-0.025 MG per tablet 1 tablet  enoxaparin (LOVENOX) injection 40 mg  furosemide (LASIX) injection 40 mg  lisinopril (PRINIVIL,ZESTRIL) tablet 20 mg  loperamide (IMODIUM) capsule 2 mg  metoprolol succinate (TOPROL-XL) 24 hr tablet 25 mg  mirtazapine (REMERON SOL-TAB) disintegrating tablet 7.5 mg  potassium chloride SA (K-DUR,KLOR-CON) CR tablet 40 mEq   Technical Summary: A multichannel digital EEG recording measured by the international 10-20 system with electrodes applied with paste and impedances below 5000 ohms performed in our laboratory with EKG monitoring in an awake and asleep patient.  Hyperventilation and photic stimulation were not performed.  The digital EEG was referentially recorded, reformatted, and digitally filtered in a variety of bipolar and referential montages for optimal display.    Description: The patient is predominantly drowsy and asleep during the recording.  During brief period of maximal wakefulness, there is a symmetric, medium voltage 8.5 Hz posterior dominant rhythm that attenuates with eye opening. During drowsiness and sleep, there is an increase in theta slowing of the background, with occasional independent focal theta slowing over the right greater than left temporal regions.  Vertex waves and symmetric sleep spindles were seen.  Hyperventilation and photic stimulation were not performed.  There were no epileptiform discharges or electrographic  seizures seen.    EKG lead was unremarkable.  Impression: This predominantly drowsy and asleep EEG is abnormal due to independent focal slowing over the bilateral temporal regions, right greater than left seen during sleep.  Clinical Correlation of the above findings indicates focal cerebral dysfunction over the bilateral temporal regions suggestive of underlying structural or physiologic abnormality. The absence of epileptiform discharges does not exclude a clinical diagnosis of epilepsy. Clinical correlation is advised.   Patrcia DollyKaren London Tarnowski, M.D.

## 2017-03-09 NOTE — Progress Notes (Addendum)
PROGRESS NOTE    [name] [name]  WGN:[id number] DOB: 08/26/1927 DOA: 03/07/2017 PCP: Kristian Covey, MD   Chief Complaint  Patient presents with  . Shortness of Breath    Brief Narrative:  83 year old female with history of coronary artery disease, hypertension, hyperlipidemia, presented with leg swelling and shortness of breath. Patient found to have systolic heart failure. Cardiology consulted, currently on IV Lasix. Assessment & Plan   Acute respiratory failure -Likely secondary to acute systolic heart failure -Patient currently on 2 L supplemental oxygen, will attempt to wean if possible  Acute Systolic CHF -Echocardiogram February 2017 showed a normal EF -Echocardiogram EF of 35%, grade 1 diastolic dysfunction. Basal to midinferior akinesis. Inferior lateral akinesis. Basal to mid anterior lateral akinesis. -Cardiology consulted and appreciated -Currently on IV Lasix, 40 mg TID -Urine output 2650 mL over the past 24 hours -Continue to monitor intake, output, daily weights -Continue metoprolol, lisinopril  Coronary artery disease -Currently denies chest pain -Continue aspirin, statin, Plavix, metoprolol -Cardiology recommending only aspirin  Essential hypertension -Continue lisinopril, metoprolol, Lasix  Chronic pain/arthritis/physcial deconditioning -Patient uses a wheelchair, sometimes a walker.  -Has been residing at nursing facility for the past 3-4 years, [facility] -Patient did have fall -had hip fracture with ORIF in 2013 -PT and OT -continue pain control  Hypokalemia -Likely secondary to diuresis -Resolved, continue to monitor BMP  Hypomagnesemia  -Will replace and continue to montior   Acute metabolic encephalopathy -CT head showed no acute abnormality -Seems that patient had an episode of altered mental status overnight -Neurology consulted and appreciated, recommended EEG. Felt this to be likely secondary to hypoxia, less likely seizure. No AED  at this time -Currently patient does appear to be alert and oriented 3  DVT Prophylaxis  Lovenox  Code Status: DO NOT RESUSCITATE  Family Communication: None at bedside  Disposition Plan: Admitted. Pending further recommendations from cardiology regarding diuresis. Dispo to SNF when stable and improved.   Consultants Cardiology  Neurology  Procedures  Echocardiogram EEG  Antibiotics   Anti-infectives    None      Subjective:   [name] [name] seen and examined today.  Patient feeling better this morning. Feels breathing is improving. Denies any current chest pain, abdominal pain, nausea vomiting, diarrhea or constipation, dizziness or headache.  Objective:   Vitals:   03/08/17 1713 03/08/17 2132 03/09/17 0615 03/09/17 1000  BP: (!) 142/98 (!) 125/58 (!) 165/79 124/62  Pulse: 79 74 60 72  Resp: 16 17 18    Temp:  98.1 F (36.7 C) 98.1 F (36.7 C)   TempSrc:  Oral Oral   SpO2: 97% 98% 95% 94%  Weight:  55.5 kg (122 lb 4.8 oz) 54.3 kg (119 lb 11.4 oz)   Height:        Intake/Output Summary (Last 24 hours) at 03/09/17 1209 Last data filed at 03/09/17 7846  Gross per 24 hour  Intake              360 ml  Output             2001 ml  Net            -1641 ml   Filed Weights   03/07/17 2127 03/08/17 2132 03/09/17 0615  Weight: 56.7 kg (125 lb 1.6 oz) 55.5 kg (122 lb 4.8 oz) 54.3 kg (119 lb 11.4 oz)    Exam  General: Well developed, well nourished, NAD, appears stated age  HEENT: NCAT,  mucous membranes moist.   Neck:  Supple, no JVD, no masses  Cardiovascular: S1 S2 auscultated, no rubs, murmurs or gallops. Regular rate and rhythm.  Respiratory: Diminished however Clear to auscultation bilaterally   Abdomen: Soft, nontender, nondistended, + bowel sounds  Extremities: warm dry without cyanosis clubbing. Trace LE edema.   Neuro: AAOx3, hard of hearing, otherwise nonfocal   Psych: appropriate mood and affect, pleasant   Data Reviewed: I have personally  reviewed following labs and imaging studies  CBC:  Recent Labs Lab 03/07/17 1025 03/07/17 2139 03/08/17 0314  WBC 8.7 9.6 9.0  HGB 13.0 12.9 12.8  HCT 39.7 39.8 39.8  MCV 91.9 92.6 92.8  PLT 258 254 246   Basic Metabolic Panel:  Recent Labs Lab 03/07/17 1025 03/08/17 0314 03/09/17 0422  NA 140 142 141  K 3.3* 3.4* 4.6  CL 107 105 103  CO2 26 27 27   GLUCOSE 165* 115* 115*  BUN 19 15 26*  CREATININE 1.01* 0.92 1.02*  CALCIUM 9.7 9.5 9.8  MG  --  1.3*  --   PHOS  --  2.8  --    GFR: Estimated Creatinine Clearance: 33.3 mL/min (A) (by C-G formula based on SCr of 1.02 mg/dL (H)). Liver Function Tests:  Recent Labs Lab 03/07/17 1025  AST 21  ALT 14  ALKPHOS 58  BILITOT 0.9  PROT 6.2*  ALBUMIN 4.0   No results for input(s): LIPASE, AMYLASE in the last 168 hours. No results for input(s): AMMONIA in the last 168 hours. Coagulation Profile: No results for input(s): INR, PROTIME in the last 168 hours. Cardiac Enzymes: No results for input(s): CKTOTAL, CKMB, CKMBINDEX, TROPONINI in the last 168 hours. BNP (last 3 results) No results for input(s): PROBNP in the last 8760 hours. HbA1C: No results for input(s): HGBA1C in the last 72 hours. CBG: No results for input(s): GLUCAP in the last 168 hours. Lipid Profile: No results for input(s): CHOL, HDL, LDLCALC, TRIG, CHOLHDL, LDLDIRECT in the last 72 hours. Thyroid Function Tests: No results for input(s): TSH, T4TOTAL, FREET4, T3FREE, THYROIDAB in the last 72 hours. Anemia Panel: No results for input(s): VITAMINB12, FOLATE, FERRITIN, TIBC, IRON, RETICCTPCT in the last 72 hours. Urine analysis:    Component Value Date/Time   COLORURINE YELLOW 03/07/2017 1255   APPEARANCEUR CLEAR 03/07/2017 1255   LABSPEC 1.012 03/07/2017 1255   PHURINE 5.0 03/07/2017 1255   GLUCOSEU NEGATIVE 03/07/2017 1255   HGBUR NEGATIVE 03/07/2017 1255   BILIRUBINUR NEGATIVE 03/07/2017 1255   KETONESUR NEGATIVE 03/07/2017 1255   PROTEINUR  100 (A) 03/07/2017 1255   UROBILINOGEN 0.2 12/16/2011 0630   NITRITE NEGATIVE 03/07/2017 1255   LEUKOCYTESUR TRACE (A) 03/07/2017 1255   Sepsis Labs: @LABRCNTIP (procalcitonin:4,lacticidven:4)  ) Recent Results (from the past 240 hour(s))  MRSA PCR Screening     Status: None   Collection Time: 03/08/17  7:59 PM  Result Value Ref Range Status   MRSA by PCR NEGATIVE NEGATIVE Final    Comment:        The GeneXpert MRSA Assay (FDA approved for NASAL specimens only), is one component of a comprehensive MRSA colonization surveillance program. It is not intended to diagnose MRSA infection nor to guide or monitor treatment for MRSA infections.       Radiology Studies: Ct Head Code Stroke Wo Contrast  Result Date: 03/08/2017 CLINICAL DATA:  Code stroke. 83 y/o F; left facial droop and weakness, now resolved. EXAM: CT HEAD WITHOUT CONTRAST TECHNIQUE: Contiguous axial images were obtained from the base of the skull through the  vertex without intravenous contrast. COMPARISON:  01/06/2016 CT head FINDINGS: Brain: Interval subcentimeter lucency in right thalamus compatible with age-indeterminate lacunar infarction. No evidence for large vascular territory infarct, intracranial hemorrhage, or focal mass effect. Patchy nonspecific foci of hypoattenuation subcortical and periventricular white matter compatible with chronic microvascular ischemic changes and there is parenchymal volume loss stable prior CT. Vascular: Extensive calcific atherosclerosis of vertebrobasilar system and carotid siphons. No hyperdense vessel identified. Skull: Normal. Negative for fracture or focal lesion. Sinuses/Orbits: Bilateral mastoid effusions. Normally aerated paranasal sinuses. Bilateral intra-ocular lens replacement. Other: None. ASPECTS [facility] Stroke Program Early CT Score) - Ganglionic level infarction (caudate, lentiform nuclei, internal capsule, insula, M1-M3 cortex): 7 - Supraganglionic infarction (M4-M6 cortex):  3 Total score (0-10 with 10 being normal): 10 IMPRESSION: 1. Interval subcentimeter lucency in right thalamus compatible with age-indeterminate lacunar infarction. 2. No acute vascular territory infarction or intracranial hemorrhage. 3. ASPECTS is 10. 4. Stable chronic microvascular ischemic changes and parenchymal volume loss. These results were called by telephone at the time of interpretation on 03/08/2017 at 5:37 pm to Dr. Arther DamesSUSHANTH AROOR , who verbally acknowledged these results. Electronically Signed   By: Mitzi HansenLance  Furusawa-Stratton M.D.   On: 03/08/2017 17:45     Scheduled Meds: . aspirin EC  81 mg Oral Daily  . atorvastatin  20 mg Oral Daily  . clopidogrel  75 mg Oral Daily  . enoxaparin (LOVENOX) injection  40 mg Subcutaneous Q24H  . furosemide  40 mg Intravenous Q8H  . gabapentin  100 mg Oral BID  . lisinopril  20 mg Oral Daily  . metoprolol succinate  25 mg Oral Daily  . mirtazapine  7.5 mg Oral QHS  . potassium chloride  40 mEq Oral BID   Continuous Infusions: . sodium chloride       LOS: 1 day   Time Spent in minutes   30 minutes  Inez Rosato D.O. on 03/09/2017 at 12:09 PM  Between 7am to 7pm - Pager - [phone]  After 7pm go to [email or web address] - password [email or web address]  And look for the night coverage person covering for me after hours  [facility] Office  [phone]

## 2017-03-09 NOTE — Progress Notes (Signed)
[facility] SW is aware and is following for discharge back to facility; Alexis GoodellB Krishiv Sandler RN,MHA,BSN [phone]

## 2017-03-10 DIAGNOSIS — I272 Pulmonary hypertension, unspecified: Secondary | ICD-10-CM

## 2017-03-10 LAB — BASIC METABOLIC PANEL
Anion gap: 9 (ref 5–15)
BUN: 39 mg/dL — ABNORMAL HIGH (ref 6–20)
CALCIUM: 9.9 mg/dL (ref 8.9–10.3)
CO2: 28 mmol/L (ref 22–32)
Chloride: 103 mmol/L (ref 101–111)
Creatinine, Ser: 1.22 mg/dL — ABNORMAL HIGH (ref 0.44–1.00)
GFR calc non Af Amer: 39 mL/min — ABNORMAL LOW (ref >60)
GFR, EST AFRICAN AMERICAN: 45 mL/min — AB (ref >60)
Glucose, Bld: 120 mg/dL — ABNORMAL HIGH (ref 65–99)
Potassium: 5.6 mmol/L — ABNORMAL HIGH (ref 3.5–5.1)
SODIUM: 140 mmol/L (ref 135–145)

## 2017-03-10 LAB — CBC
HCT: 41.2 % (ref 36.0–46.0)
Hemoglobin: 13.3 g/dL (ref 12.0–15.0)
MCH: 29.9 pg (ref 26.0–34.0)
MCHC: 32.3 g/dL (ref 30.0–36.0)
MCV: 92.6 fL (ref 78.0–100.0)
PLATELETS: 299 10*3/uL (ref 150–400)
RBC: 4.45 MIL/uL (ref 3.87–5.11)
RDW: 14.4 % (ref 11.5–15.5)
WBC: 10.7 10*3/uL — AB (ref 4.0–10.5)

## 2017-03-10 MED ORDER — FUROSEMIDE 80 MG PO TABS
80.0000 mg | ORAL_TABLET | Freq: Two times a day (BID) | ORAL | Status: DC
  Administered 2017-03-10 – 2017-03-11 (×2): 80 mg via ORAL
  Filled 2017-03-10 (×2): qty 1

## 2017-03-10 MED ORDER — ENOXAPARIN SODIUM 30 MG/0.3ML ~~LOC~~ SOLN
30.0000 mg | SUBCUTANEOUS | Status: DC
  Administered 2017-03-10 – 2017-03-11 (×2): 30 mg via SUBCUTANEOUS
  Filled 2017-03-10 (×2): qty 0.3

## 2017-03-10 NOTE — Progress Notes (Signed)
Pt is able to sit at the edge of the bed and dangle her foot for some time, family members bedside and is updating, pt is in room air and saturation is maintained at 94%, will continue to monitor

## 2017-03-10 NOTE — Progress Notes (Signed)
Progress Note  Patient Name: [name] [name] Date of Encounter: 03/10/2017  Primary Cardiologist: Dr. Herbie BaltimoreHarding  Subjective   Feels better.  Feels that she is urinating a lot.  Inpatient Medications    Scheduled Meds: . aspirin EC  81 mg Oral Daily  . atorvastatin  20 mg Oral Daily  . clopidogrel  75 mg Oral Daily  . enoxaparin (LOVENOX) injection  40 mg Subcutaneous Q24H  . furosemide  40 mg Intravenous Q8H  . gabapentin  100 mg Oral BID  . lisinopril  20 mg Oral Daily  . metoprolol succinate  25 mg Oral Daily  . mirtazapine  7.5 mg Oral QHS   Continuous Infusions: . sodium chloride     PRN Meds: acetaminophen **OR** acetaminophen, loperamide **OR** diphenoxylate-atropine, ondansetron **OR** ondansetron (ZOFRAN) IV   Vital Signs    Vitals:   03/09/17 2209 03/10/17 0542 03/10/17 0900 03/10/17 0918  BP: (!) 119/58 (!) 150/81  130/68  Pulse: 68 64  67  Resp:  18    Temp:  98.2 F (36.8 C)  98 F (36.7 C)  TempSrc:  Oral  Oral  SpO2:  93% 94% 92%  Weight:  119 lb 7.8 oz (54.2 kg)    Height:        Intake/Output Summary (Last 24 hours) at 03/10/17 1112 Last data filed at 03/10/17 0914  Gross per 24 hour  Intake              650 ml  Output             2550 ml  Net            -1900 ml   Filed Weights   03/08/17 2132 03/09/17 0615 03/10/17 0542  Weight: 122 lb 4.8 oz (55.5 kg) 119 lb 11.4 oz (54.3 kg) 119 lb 7.8 oz (54.2 kg)    Telemetry     NSR- Personally Reviewed  ECG    None recently - Personally Reviewed  Physical Exam   GEN: No acute distress.  frail Neck: No JVD Cardiac: RRR, no murmurs, rubs, or gallops.  Respiratory: Clear to auscultation bilaterally. GI: Soft, nontender, non-distended  MS: No edema; No deformity. Neuro:  Nonfocal  Psych: Normal affect   Labs    Chemistry Recent Labs Lab 03/07/17 1025 03/08/17 0314 03/09/17 0422 03/10/17 0335  NA 140 142 141 140  K 3.3* 3.4* 4.6 5.6*  CL 107 105 103 103  CO2 26 27 27 28     GLUCOSE 165* 115* 115* 120*  BUN 19 15 26* 39*  CREATININE 1.01* 0.92 1.02* 1.22*  CALCIUM 9.7 9.5 9.8 9.9  PROT 6.2*  --   --   --   ALBUMIN 4.0  --   --   --   AST 21  --   --   --   ALT 14  --   --   --   ALKPHOS 58  --   --   --   BILITOT 0.9  --   --   --   GFRNONAA 49* 54* 48* 39*  GFRAA 56* >60 56* 45*  ANIONGAP 7 10 11 9      Hematology Recent Labs Lab 03/07/17 2139 03/08/17 0314 03/10/17 0335  WBC 9.6 9.0 10.7*  RBC 4.30 4.29 4.45  HGB 12.9 12.8 13.3  HCT 39.8 39.8 41.2  MCV 92.6 92.8 92.6  MCH 30.0 29.8 29.9  MCHC 32.4 32.2 32.3  RDW 14.9 14.7 14.4  PLT 254 246  299    Cardiac EnzymesNo results for input(s): TROPONINI in the last 168 hours.  Recent Labs Lab 03/07/17 1034  TROPIPOC 0.12*     BNP Recent Labs Lab 03/07/17 1025  BNP 703.6*     DDimer No results for input(s): DDIMER in the last 168 hours.   Radiology    Dg Chest 2 View  Result Date: 03/09/2017 CLINICAL DATA:  83 year old female with transient altered level of consciousness, left side weakness. EXAM: CHEST  2 VIEW COMPARISON:  Chest radiographs 12/15/2011. FINDINGS: Semi upright AP and lateral views of the chest. Chronic moderate elevation of the right hemidiaphragm is stable. Stable cardiomegaly and tortuosity of the thoracic aorta. A degree of chronic central pulmonary artery enlargement is also suspected. Mediastinal contours are stable. Visualized tracheal air column is within normal limits. No pneumothorax, pulmonary edema or consolidation. Possible small right pleural effusion. Osteopenia. Negative visible bowel gas pattern. IMPRESSION: 1. Possible small right pleural effusion, but no other acute cardiopulmonary abnormality. 2. Chronic cardiomegaly. Electronically Signed   By: Odessa Fleming M.D.   On: 03/09/2017 13:57   Mr Laqueta Jean ZO Contrast  Result Date: 03/09/2017 CLINICAL DATA:  83 y/o  F; TIA. EXAM: MRI HEAD WITHOUT AND WITH CONTRAST TECHNIQUE: Multiplanar, multiecho pulse sequences of  the brain and surrounding structures were obtained without and with intravenous contrast. CONTRAST:  10 cc MultiHance COMPARISON:  03/08/2017 CT of the head. FINDINGS: Brain: No acute infarction, hemorrhage, hydrocephalus, extra-axial collection or mass lesion. Several nonspecific foci of T2 FLAIR hyperintense signal abnormality in subcortical and periventricular white matter is compatible with moderate chronic microvascular ischemic changes. Moderate brain parenchymal volume loss. T2 hyperintense focus in the right thalamus without reduced diffusion is compatible with a chronic lacunar infarction. No abnormal enhancement. Vascular: Normal flow voids. Skull and upper cervical spine: Normal marrow signal. Sinuses/Orbits: Bilateral mastoid effusions. No abnormal signal of paranasal sinuses. Bilateral intra-ocular lens replacement. Other: None. IMPRESSION: 1. No acute intracranial abnormality or abnormal enhancement of the brain. 2. Moderate for age chronic microvascular ischemic changes and moderate parenchymal volume loss of the brain. Right thalamus small chronic lacunar infarction. 3. Bilateral mastoid air cell effusions. Electronically Signed   By: Mitzi Hansen M.D.   On: 03/09/2017 21:40   Ct Head Code Stroke Wo Contrast  Result Date: 03/08/2017 CLINICAL DATA:  Code stroke. 83 y/o F; left facial droop and weakness, now resolved. EXAM: CT HEAD WITHOUT CONTRAST TECHNIQUE: Contiguous axial images were obtained from the base of the skull through the vertex without intravenous contrast. COMPARISON:  01/06/2016 CT head FINDINGS: Brain: Interval subcentimeter lucency in right thalamus compatible with age-indeterminate lacunar infarction. No evidence for large vascular territory infarct, intracranial hemorrhage, or focal mass effect. Patchy nonspecific foci of hypoattenuation subcortical and periventricular white matter compatible with chronic microvascular ischemic changes and there is parenchymal volume  loss stable prior CT. Vascular: Extensive calcific atherosclerosis of vertebrobasilar system and carotid siphons. No hyperdense vessel identified. Skull: Normal. Negative for fracture or focal lesion. Sinuses/Orbits: Bilateral mastoid effusions. Normally aerated paranasal sinuses. Bilateral intra-ocular lens replacement. Other: None. ASPECTS [facility] Stroke Program Early CT Score) - Ganglionic level infarction (caudate, lentiform nuclei, internal capsule, insula, M1-M3 cortex): 7 - Supraganglionic infarction (M4-M6 cortex): 3 Total score (0-10 with 10 being normal): 10 IMPRESSION: 1. Interval subcentimeter lucency in right thalamus compatible with age-indeterminate lacunar infarction. 2. No acute vascular territory infarction or intracranial hemorrhage. 3. ASPECTS is 10. 4. Stable chronic microvascular ischemic changes and parenchymal volume loss. These  results were called by telephone at the time of interpretation on 03/08/2017 at 5:37 pm to Dr. Arther DamesSUSHANTH AROOR , who verbally acknowledged these results. Electronically Signed   By: Mitzi HansenLance  Furusawa-Stratton M.D.   On: 03/08/2017 17:45    Cardiac Studies   LVEF 35%  Patient Profile     83 y.o. female with acute on chronic systolic heart failure  Assessment & Plan    1) Acute on chronic systolic heart failure: Diuresing well.  Would switch to oral Lasix, 80 mg PO BID.  Cr starting to increase.  This would be an effective decrease in her dose.  COntinue to follow Cr closely.  May be able to lower dose of Lasix below 80 BID depending on how she tolerates this.  Would have to watch lisinopril as well depending on Cr.   2) COntinue other BP control meds.  3) She is frail.  [facility]  Will need  Assistance after discharge.    Signed, Lance MussJayadeep Dannetta Lekas, MD  03/10/2017, 11:12 AM

## 2017-03-10 NOTE — Progress Notes (Addendum)
PROGRESS NOTE    [name] [name]  AVW:[id number] DOB: [birth year] DOA: 03/07/2017 PCP: Kristian CoveyBurchette, Bruce W, MD   Chief Complaint  Patient presents with  . Shortness of Breath    Brief Narrative:  83 year old female with history of coronary artery disease, hypertension, hyperlipidemia, presented with leg swelling and shortness of breath. Patient found to have systolic heart failure. Cardiology consulted. Addendum: diuresed with Lasix. Assessment & Plan   Acute respiratory failure -Likely secondary to acute systolic heart failure -Wean off of oxygen as tolerated  Acute Systolic CHF -Echocardiogram February 2017 showed a normal EF -Echocardiogram EF of 35%, grade 1 diastolic dysfunction. Basal to midinferior akinesis. Inferior lateral akinesis. Basal to mid anterior lateral akinesis. -Cardiology consulted and appreciated -Currently on 80 mg by mouth twice a day - Patient is on lisinopril and metoprolol  Hyperkalemia - will hold K replacement. Pt is on lasix as such suspect K levels will drop - reassess K levels next am.  Coronary artery disease -Currently denies chest pain -Continue aspirin, statin, Plavix, metoprolol -Cardiology recommending only aspirin  Essential hypertension -Continue lisinopril, metoprolol, Lasix  Chronic pain/arthritis/physcial deconditioning -Patient uses a wheelchair, sometimes a walker.  -Has been residing at nursing facility for the past 3-4 years, [facility] -Patient did have fall -had hip fracture with ORIF in 2013 -PT and OT -continue pain control   Hypomagnesemia  -Will replace and continue to montior  - Reassess next a.m.  Acute metabolic encephalopathy -CT head showed no acute abnormality -Seems that patient had an episode of altered mental status overnight -Neurology consulted and appreciated, recommended EEG. Felt this to be likely secondary to hypoxia, less likely seizure. No AED at this time -Currently patient does appear to be alert  and oriented 3  DVT Prophylaxis  Lovenox  Code Status: DO NOT RESUSCITATE  Family Communication: None at bedside  Disposition Plan:  Dispo to SNF when stable and improved.   Consultants Cardiology  Neurology  Procedures  Echocardiogram EEG  Antibiotics   Anti-infectives    None      Subjective:   [name] [name] pt has no new complaints or concerns.  Objective:   Vitals:   03/10/17 0542 03/10/17 0900 03/10/17 0918 03/10/17 1142  BP: (!) 150/81  130/68 124/69  Pulse: 64  67 65  Resp: 18     Temp: 98.2 F (36.8 C)  98 F (36.7 C) 98.4 F (36.9 C)  TempSrc: Oral  Oral Oral  SpO2: 93% 94% 92% 90%  Weight: 54.2 kg (119 lb 7.8 oz)     Height:        Intake/Output Summary (Last 24 hours) at 03/10/17 1623 Last data filed at 03/10/17 1353  Gross per 24 hour  Intake              360 ml  Output             2050 ml  Net            -1690 ml   Filed Weights   03/08/17 2132 03/09/17 0615 03/10/17 0542  Weight: 55.5 kg (122 lb 4.8 oz) 54.3 kg (119 lb 11.4 oz) 54.2 kg (119 lb 7.8 oz)    Exam  General: Well developed, well nourished, NAD  HEENT: NCAT,  mucous membranes moist.   Neck: Supple, no JVD, no masses  Cardiovascular: S1 S2 auscultated, no rubs, murmurs or gallops. Regular rate and rhythm.  Respiratory: Equal chest rise, no wheezes, no increased work of breathing  Abdomen: Soft, nontender,  nondistended, + bowel sounds  Extremities: warm dry without cyanosis clubbing. Trace LE edema.   Neuro: AAOx3, hard of hearing, otherwise nonfocal   Psych: appropriate mood and affect, pleasant   Data Reviewed: I have personally reviewed following labs and imaging studies  CBC:  Recent Labs Lab 03/07/17 1025 03/07/17 2139 03/08/17 0314 03/10/17 0335  WBC 8.7 9.6 9.0 10.7*  HGB 13.0 12.9 12.8 13.3  HCT 39.7 39.8 39.8 41.2  MCV 91.9 92.6 92.8 92.6  PLT 258 254 246 299   Basic Metabolic Panel:  Recent Labs Lab 03/07/17 1025 03/08/17 0314  03/09/17 0422 03/09/17 1219 03/10/17 0335  NA 140 142 141  --  140  K 3.3* 3.4* 4.6  --  5.6*  CL 107 105 103  --  103  CO2 26 27 27   --  28  GLUCOSE 165* 115* 115*  --  120*  BUN 19 15 26*  --  39*  CREATININE 1.01* 0.92 1.02*  --  1.22*  CALCIUM 9.7 9.5 9.8  --  9.9  MG  --  1.3*  --  1.4*  --   PHOS  --  2.8  --   --   --    GFR: Estimated Creatinine Clearance: 27.8 mL/min (A) (by C-G formula based on SCr of 1.22 mg/dL (H)). Liver Function Tests:  Recent Labs Lab 03/07/17 1025  AST 21  ALT 14  ALKPHOS 58  BILITOT 0.9  PROT 6.2*  ALBUMIN 4.0   No results for input(s): LIPASE, AMYLASE in the last 168 hours. No results for input(s): AMMONIA in the last 168 hours. Coagulation Profile: No results for input(s): INR, PROTIME in the last 168 hours. Cardiac Enzymes: No results for input(s): CKTOTAL, CKMB, CKMBINDEX, TROPONINI in the last 168 hours. BNP (last 3 results) No results for input(s): PROBNP in the last 8760 hours. HbA1C: No results for input(s): HGBA1C in the last 72 hours. CBG: No results for input(s): GLUCAP in the last 168 hours. Lipid Profile: No results for input(s): CHOL, HDL, LDLCALC, TRIG, CHOLHDL, LDLDIRECT in the last 72 hours. Thyroid Function Tests: No results for input(s): TSH, T4TOTAL, FREET4, T3FREE, THYROIDAB in the last 72 hours. Anemia Panel: No results for input(s): VITAMINB12, FOLATE, FERRITIN, TIBC, IRON, RETICCTPCT in the last 72 hours. Urine analysis:    Component Value Date/Time   COLORURINE YELLOW 03/07/2017 1255   APPEARANCEUR CLEAR 03/07/2017 1255   LABSPEC 1.012 03/07/2017 1255   PHURINE 5.0 03/07/2017 1255   GLUCOSEU NEGATIVE 03/07/2017 1255   HGBUR NEGATIVE 03/07/2017 1255   BILIRUBINUR NEGATIVE 03/07/2017 1255   KETONESUR NEGATIVE 03/07/2017 1255   PROTEINUR 100 (A) 03/07/2017 1255   UROBILINOGEN 0.2 12/16/2011 0630   NITRITE NEGATIVE 03/07/2017 1255   LEUKOCYTESUR TRACE (A) 03/07/2017 1255   Sepsis  Labs: @LABRCNTIP (procalcitonin:4,lacticidven:4)  ) Recent Results (from the past 240 hour(s))  MRSA PCR Screening     Status: None   Collection Time: 03/08/17  7:59 PM  Result Value Ref Range Status   MRSA by PCR NEGATIVE NEGATIVE Final    Comment:        The GeneXpert MRSA Assay (FDA approved for NASAL specimens only), is one component of a comprehensive MRSA colonization surveillance program. It is not intended to diagnose MRSA infection nor to guide or monitor treatment for MRSA infections.       Radiology Studies: Dg Chest 2 View  Result Date: 03/09/2017 CLINICAL DATA:  83 year old female with transient altered level of consciousness, left side weakness. EXAM:  CHEST  2 VIEW COMPARISON:  Chest radiographs 12/15/2011. FINDINGS: Semi upright AP and lateral views of the chest. Chronic moderate elevation of the right hemidiaphragm is stable. Stable cardiomegaly and tortuosity of the thoracic aorta. A degree of chronic central pulmonary artery enlargement is also suspected. Mediastinal contours are stable. Visualized tracheal air column is within normal limits. No pneumothorax, pulmonary edema or consolidation. Possible small right pleural effusion. Osteopenia. Negative visible bowel gas pattern. IMPRESSION: 1. Possible small right pleural effusion, but no other acute cardiopulmonary abnormality. 2. Chronic cardiomegaly. Electronically Signed   By: Odessa FlemingH  Hall M.D.   On: 03/09/2017 13:57   Mr Laqueta JeanBrain W ZOWo Contrast  Result Date: 03/09/2017 CLINICAL DATA:  83 y/o  F; TIA. EXAM: MRI HEAD WITHOUT AND WITH CONTRAST TECHNIQUE: Multiplanar, multiecho pulse sequences of the brain and surrounding structures were obtained without and with intravenous contrast. CONTRAST:  10 cc MultiHance COMPARISON:  03/08/2017 CT of the head. FINDINGS: Brain: No acute infarction, hemorrhage, hydrocephalus, extra-axial collection or mass lesion. Several nonspecific foci of T2 FLAIR hyperintense signal abnormality in  subcortical and periventricular white matter is compatible with moderate chronic microvascular ischemic changes. Moderate brain parenchymal volume loss. T2 hyperintense focus in the right thalamus without reduced diffusion is compatible with a chronic lacunar infarction. No abnormal enhancement. Vascular: Normal flow voids. Skull and upper cervical spine: Normal marrow signal. Sinuses/Orbits: Bilateral mastoid effusions. No abnormal signal of paranasal sinuses. Bilateral intra-ocular lens replacement. Other: None. IMPRESSION: 1. No acute intracranial abnormality or abnormal enhancement of the brain. 2. Moderate for age chronic microvascular ischemic changes and moderate parenchymal volume loss of the brain. Right thalamus small chronic lacunar infarction. 3. Bilateral mastoid air cell effusions. Electronically Signed   By: Mitzi HansenLance  Furusawa-Stratton M.D.   On: 03/09/2017 21:40   Ct Head Code Stroke Wo Contrast  Result Date: 03/08/2017 CLINICAL DATA:  Code stroke. 83 y/o F; left facial droop and weakness, now resolved. EXAM: CT HEAD WITHOUT CONTRAST TECHNIQUE: Contiguous axial images were obtained from the base of the skull through the vertex without intravenous contrast. COMPARISON:  01/06/2016 CT head FINDINGS: Brain: Interval subcentimeter lucency in right thalamus compatible with age-indeterminate lacunar infarction. No evidence for large vascular territory infarct, intracranial hemorrhage, or focal mass effect. Patchy nonspecific foci of hypoattenuation subcortical and periventricular white matter compatible with chronic microvascular ischemic changes and there is parenchymal volume loss stable prior CT. Vascular: Extensive calcific atherosclerosis of vertebrobasilar system and carotid siphons. No hyperdense vessel identified. Skull: Normal. Negative for fracture or focal lesion. Sinuses/Orbits: Bilateral mastoid effusions. Normally aerated paranasal sinuses. Bilateral intra-ocular lens replacement. Other:  None. ASPECTS [facility] Stroke Program Early CT Score) - Ganglionic level infarction (caudate, lentiform nuclei, internal capsule, insula, M1-M3 cortex): 7 - Supraganglionic infarction (M4-M6 cortex): 3 Total score (0-10 with 10 being normal): 10 IMPRESSION: 1. Interval subcentimeter lucency in right thalamus compatible with age-indeterminate lacunar infarction. 2. No acute vascular territory infarction or intracranial hemorrhage. 3. ASPECTS is 10. 4. Stable chronic microvascular ischemic changes and parenchymal volume loss. These results were called by telephone at the time of interpretation on 03/08/2017 at 5:37 pm to Dr. Arther DamesSUSHANTH AROOR , who verbally acknowledged these results. Electronically Signed   By: Mitzi HansenLance  Furusawa-Stratton M.D.   On: 03/08/2017 17:45     Scheduled Meds: . aspirin EC  81 mg Oral Daily  . atorvastatin  20 mg Oral Daily  . clopidogrel  75 mg Oral Daily  . enoxaparin (LOVENOX) injection  30 mg Subcutaneous Q24H  .  furosemide  80 mg Oral BID  . gabapentin  100 mg Oral BID  . lisinopril  20 mg Oral Daily  . metoprolol succinate  25 mg Oral Daily  . mirtazapine  7.5 mg Oral QHS   Continuous Infusions: . sodium chloride       LOS: 2 days   Time Spent in minutes   30 minutes  Penny Pia MD. on 03/10/2017 at 4:23 PM  Between 7am to 7pm - Pager - [phone]  After 7pm go to [email or web address] - password [email or web address]  And look for the night coverage person covering for me after hours  [facility] Office  [phone]

## 2017-03-10 NOTE — Progress Notes (Signed)
Pt's recent K is 5.6 but pt is still scheduled for PO K at this am,  will verify MD regarding this.

## 2017-03-11 DIAGNOSIS — N179 Acute kidney failure, unspecified: Secondary | ICD-10-CM

## 2017-03-11 DIAGNOSIS — N183 Chronic kidney disease, stage 3 (moderate): Secondary | ICD-10-CM

## 2017-03-11 LAB — BASIC METABOLIC PANEL
ANION GAP: 11 (ref 5–15)
BUN: 60 mg/dL — ABNORMAL HIGH (ref 6–20)
CALCIUM: 9.7 mg/dL (ref 8.9–10.3)
CO2: 26 mmol/L (ref 22–32)
CREATININE: 1.82 mg/dL — AB (ref 0.44–1.00)
Chloride: 101 mmol/L (ref 101–111)
GFR calc Af Amer: 28 mL/min — ABNORMAL LOW (ref >60)
GFR, EST NON AFRICAN AMERICAN: 24 mL/min — AB (ref >60)
GLUCOSE: 140 mg/dL — AB (ref 65–99)
Potassium: 4.7 mmol/L (ref 3.5–5.1)
Sodium: 138 mmol/L (ref 135–145)

## 2017-03-11 LAB — MAGNESIUM: Magnesium: 2.1 mg/dL (ref 1.7–2.4)

## 2017-03-11 NOTE — Progress Notes (Signed)
Progress Note  Patient Name: [name] [name] Date of Encounter: 03/11/2017  Primary Cardiologist: Herbie BaltimoreHarding  Subjective   Hearing aids not working well today.   Inpatient Medications    Scheduled Meds: . aspirin EC  81 mg Oral Daily  . atorvastatin  20 mg Oral Daily  . clopidogrel  75 mg Oral Daily  . enoxaparin (LOVENOX) injection  30 mg Subcutaneous Q24H  . gabapentin  100 mg Oral BID  . lisinopril  20 mg Oral Daily  . metoprolol succinate  25 mg Oral Daily  . mirtazapine  7.5 mg Oral QHS   Continuous Infusions: . sodium chloride     PRN Meds: acetaminophen **OR** acetaminophen, loperamide **OR** diphenoxylate-atropine, ondansetron **OR** ondansetron (ZOFRAN) IV   Vital Signs    Vitals:   03/10/17 1600 03/10/17 2100 03/11/17 0500 03/11/17 0922  BP:  130/63 124/61 120/60  Pulse:  (!) 59 64 76  Resp:  20 20   Temp:  98 F (36.7 C) 98.3 F (36.8 C)   TempSrc:  Oral Oral   SpO2: 94% 94% 95%   Weight:      Height:        Intake/Output Summary (Last 24 hours) at 03/11/17 1144 Last data filed at 03/10/17 1740  Gross per 24 hour  Intake              240 ml  Output              400 ml  Net             -160 ml   Filed Weights   03/08/17 2132 03/09/17 0615 03/10/17 0542  Weight: 122 lb 4.8 oz (55.5 kg) 119 lb 11.4 oz (54.3 kg) 119 lb 7.8 oz (54.2 kg)    Telemetry     NSR- Personally Reviewed  ECG    None recent - Personally Reviewed  Physical Exam   GEN: No acute distress.   Neck: No JVD Cardiac: RRR, no murmurs, rubs, or gallops.  Respiratory: Clear to auscultation bilaterally. GI: Soft, nontender, non-distended  MS: No edema; No deformity. Neuro:  Nonfocal  Psych: Normal affect   Labs    Chemistry Recent Labs Lab 03/07/17 1025  03/09/17 0422 03/10/17 0335 03/11/17 0349  NA 140  < > 141 140 138  K 3.3*  < > 4.6 5.6* 4.7  CL 107  < > 103 103 101  CO2 26  < > 27 28 26   GLUCOSE 165*  < > 115* 120* 140*  BUN 19  < > 26* 39* 60*  CREATININE  1.01*  < > 1.02* 1.22* 1.82*  CALCIUM 9.7  < > 9.8 9.9 9.7  PROT 6.2*  --   --   --   --   ALBUMIN 4.0  --   --   --   --   AST 21  --   --   --   --   ALT 14  --   --   --   --   ALKPHOS 58  --   --   --   --   BILITOT 0.9  --   --   --   --   GFRNONAA 49*  < > 48* 39* 24*  GFRAA 56*  < > 56* 45* 28*  ANIONGAP 7  < > 11 9 11   < > = values in this interval not displayed.   Hematology Recent Labs Lab 03/07/17 2139 03/08/17 [id number] 03/10/17 0335  WBC 9.6 9.0 10.7*  RBC 4.30 4.29 4.45  HGB 12.9 12.8 13.3  HCT 39.8 39.8 41.2  MCV 92.6 92.8 92.6  MCH 30.0 29.8 29.9  MCHC 32.4 32.2 32.3  RDW 14.9 14.7 14.4  PLT 254 246 299    Cardiac EnzymesNo results for input(s): TROPONINI in the last 168 hours.  Recent Labs Lab 03/07/17 1034  TROPIPOC 0.12*     BNP Recent Labs Lab 03/07/17 1025  BNP 703.6*     DDimer No results for input(s): DDIMER in the last 168 hours.   Radiology    Dg Chest 2 View  Result Date: 03/09/2017 CLINICAL DATA:  83 year old female with transient altered level of consciousness, left side weakness. EXAM: CHEST  2 VIEW COMPARISON:  Chest radiographs 12/15/2011. FINDINGS: Semi upright AP and lateral views of the chest. Chronic moderate elevation of the right hemidiaphragm is stable. Stable cardiomegaly and tortuosity of the thoracic aorta. A degree of chronic central pulmonary artery enlargement is also suspected. Mediastinal contours are stable. Visualized tracheal air column is within normal limits. No pneumothorax, pulmonary edema or consolidation. Possible small right pleural effusion. Osteopenia. Negative visible bowel gas pattern. IMPRESSION: 1. Possible small right pleural effusion, but no other acute cardiopulmonary abnormality. 2. Chronic cardiomegaly. Electronically Signed   By: Odessa FlemingH  Hall M.D.   On: 03/09/2017 13:57   Mr Laqueta JeanBrain W ZOWo Contrast  Result Date: 03/09/2017 CLINICAL DATA:  83 y/o  F; TIA. EXAM: MRI HEAD WITHOUT AND WITH CONTRAST TECHNIQUE:  Multiplanar, multiecho pulse sequences of the brain and surrounding structures were obtained without and with intravenous contrast. CONTRAST:  10 cc MultiHance COMPARISON:  03/08/2017 CT of the head. FINDINGS: Brain: No acute infarction, hemorrhage, hydrocephalus, extra-axial collection or mass lesion. Several nonspecific foci of T2 FLAIR hyperintense signal abnormality in subcortical and periventricular white matter is compatible with moderate chronic microvascular ischemic changes. Moderate brain parenchymal volume loss. T2 hyperintense focus in the right thalamus without reduced diffusion is compatible with a chronic lacunar infarction. No abnormal enhancement. Vascular: Normal flow voids. Skull and upper cervical spine: Normal marrow signal. Sinuses/Orbits: Bilateral mastoid effusions. No abnormal signal of paranasal sinuses. Bilateral intra-ocular lens replacement. Other: None. IMPRESSION: 1. No acute intracranial abnormality or abnormal enhancement of the brain. 2. Moderate for age chronic microvascular ischemic changes and moderate parenchymal volume loss of the brain. Right thalamus small chronic lacunar infarction. 3. Bilateral mastoid air cell effusions. Electronically Signed   By: Mitzi HansenLance  Furusawa-Stratton M.D.   On: 03/09/2017 21:40    Cardiac Studies   LVEF 35% on prior echocardiogram  Patient Profile     83 y.o. female acute on chronic systolic heart failure   Assessment & Plan    1) Acute on chronic systolic heart failure: Diuresis decreased yesterday after IV Lasix was switched to oral.  Acute on chronic renal insufficiency as well based on this morning's labs. Creatinine increased. Will stop diuretics for now. Restart oral Lasix when creatinine trended back towards her baseline.    2) hypertension: Blood pressure well controlled.  Signed, Lance MussJayadeep Varanasi, MD  03/11/2017, 11:44 AM

## 2017-03-11 NOTE — Progress Notes (Addendum)
PROGRESS NOTE    [name] [name]  JYN:[id number] DOB: Jan 17, 1928 DOA: 03/07/2017 PCP: Kristian CoveyBurchette, Bruce W, MD   Chief Complaint  Patient presents with  . Shortness of Breath    Brief Narrative:  83 year old female with history of coronary artery disease, hypertension, hyperlipidemia, presented with leg swelling and shortness of breath. Patient found to have systolic heart failure. Cardiology consulted.  Assessment & Plan   Acute respiratory failure -Likely secondary to acute systolic heart failure -Has been successfully weaned off of oxygen with serum creatinine trending up.  Acute Systolic CHF -Echocardiogram February 2017 showed a normal EF -Echocardiogram EF of 35%, grade 1 diastolic dysfunction. Basal to midinferior akinesis. Inferior lateral akinesis. Basal to mid anterior lateral akinesis. -Cardiology consulted and appreciated -Currently on 80 mg by mouth twice a day - Patient is on lisinopril and metoprolol - Holding Lasix until serum creatinine trended back down and starting on oral home Lasix regimen  Acute renal failure - Most likely secondary to overdiuresis with Lasix. Holding Lasix Addendum: will also hold lisinopril  Hyperkalemia - will hold K replacement. Pt is on lasix as such suspect K levels will drop - reassess K levels next am.  Coronary artery disease -Currently denies chest pain -Continue aspirin, statin, Plavix, metoprolol -Cardiology recommending only aspirin  Essential hypertension -Continue lisinopril, metoprolol, Lasix  Chronic pain/arthritis/physcial deconditioning -Patient uses a wheelchair, sometimes a walker.  -[facility] -Patient did have fall -had hip fracture with ORIF in 2013 -PT and OT -continue pain control   Hypomagnesemia  -Within normal limits on last check at 2.1  Acute metabolic encephalopathy -CT head showed no acute abnormality -Neurology consulted and EEG  obtained which is nonspecific there thinking patientMost likely had hypoxic event rather than seizures. MRI was done and was negative and neurology signed off.  Subjective:   [name] MoriHelen [place] pt has no new complaints or concerns.  Objective:   Vitals:   03/10/17 2100 03/11/17 0500 03/11/17 0922 03/11/17 1200  BP: 130/63 124/61 120/60 116/62  Pulse: (!) 59 64 76 70  Resp: 20 20  18   Temp: 98 F (36.7 C) 98.3 F (36.8 C)    TempSrc: Oral Oral    SpO2: 94% 95%  95%  Weight:      Height:        Intake/Output Summary (Last 24 hours) at 03/11/17 1322 Last data filed at 03/10/17 1740  Gross per 24 hour  Intake              240 ml  Output              400 ml  Net             -160 ml   Filed Weights   03/08/17 2132 03/09/17 0615 03/10/17 0542  Weight: 55.5 kg (122 lb 4.8 oz) 54.3 kg (119 lb 11.4 oz) 54.2 kg (119 lb 7.8 oz)    ExamExam unchanged when compared to 03/10/2017  General: Well developed, well nourished, NAD  HEENT: NCAT,  mucous membranes moist.   Neck: Supple, no JVD, no masses  Cardiovascular: S1 S2 auscultated, no rubs, murmurs or gallops. Regular rate and rhythm.  Respiratory: Equal chest rise, no wheezes, no increased work of breathing  Abdomen: Soft, nontender, nondistended, + bowel sounds  Extremities: warm dry without cyanosis clubbing. Trace LE edema.   Neuro: AAOx3, hard of hearing, otherwise nonfocal   Psych: appropriate mood and affect, pleasant  Data Reviewed: I have personally reviewed following labs and imaging studies  CBC:  Recent Labs Lab 03/07/17 1025 03/07/17 2139 03/08/17 0314 03/10/17 0335  WBC 8.7 9.6 9.0 10.7*  HGB 13.0 12.9 12.8 13.3  HCT 39.7 39.8 39.8 41.2  MCV 91.9 92.6 92.8 92.6  PLT 258 254 246 299   Basic Metabolic Panel:  Recent Labs Lab 03/07/17 1025 03/08/17 0314 03/09/17 0422 03/09/17 1219 03/10/17 0335 03/11/17 0349  NA 140 142 141  --  140 138  K 3.3* 3.4* 4.6  --  5.6* 4.7  CL 107 105 103  --  103  101  CO2 26 27 27   --  28 26  GLUCOSE 165* 115* 115*  --  120* 140*  BUN 19 15 26*  --  39* 60*  CREATININE 1.01* 0.92 1.02*  --  1.22* 1.82*  CALCIUM 9.7 9.5 9.8  --  9.9 9.7  MG  --  1.3*  --  1.4*  --  2.1  PHOS  --  2.8  --   --   --   --    GFR: Estimated Creatinine Clearance: 18.6 mL/min (A) (by C-G formula based on SCr of 1.82 mg/dL (H)). Liver Function Tests:  Recent Labs Lab 03/07/17 1025  AST 21  ALT 14  ALKPHOS 58  BILITOT 0.9  PROT 6.2*  ALBUMIN 4.0   No results for input(s): LIPASE, AMYLASE in the last 168 hours. No results for input(s): AMMONIA in the last 168 hours. Coagulation Profile: No results for input(s): INR, PROTIME in the last 168 hours. Cardiac Enzymes: No results for input(s): CKTOTAL, CKMB, CKMBINDEX, TROPONINI in the last 168 hours. BNP (last 3 results) No results for input(s): PROBNP in the last 8760 hours. HbA1C: No results for input(s): HGBA1C in the last 72 hours. CBG: No results for input(s): GLUCAP in the last 168 hours. Lipid Profile: No results for input(s): CHOL, HDL, LDLCALC, TRIG, CHOLHDL, LDLDIRECT in the last 72 hours. Thyroid Function Tests: No results for input(s): TSH, T4TOTAL, FREET4, T3FREE, THYROIDAB in the last 72 hours. Anemia Panel: No results for input(s): VITAMINB12, FOLATE, FERRITIN, TIBC, IRON, RETICCTPCT in the last 72 hours. Urine analysis:    Component Value Date/Time   COLORURINE YELLOW 03/07/2017 1255   APPEARANCEUR CLEAR 03/07/2017 1255   LABSPEC 1.012 03/07/2017 1255   PHURINE 5.0 03/07/2017 1255   GLUCOSEU NEGATIVE 03/07/2017 1255   HGBUR NEGATIVE 03/07/2017 1255   BILIRUBINUR NEGATIVE 03/07/2017 1255   KETONESUR NEGATIVE 03/07/2017 1255   PROTEINUR 100 (A) 03/07/2017 1255   UROBILINOGEN 0.2 12/16/2011 0630   NITRITE NEGATIVE 03/07/2017 1255   LEUKOCYTESUR TRACE (A) 03/07/2017 1255   Sepsis Labs: @LABRCNTIP (procalcitonin:4,lacticidven:4)  ) Recent Results (from the past 240 hour(s))  MRSA PCR  Screening     Status: None   Collection Time: 03/08/17  7:59 PM  Result Value Ref Range Status   MRSA by PCR NEGATIVE NEGATIVE Final    Comment:        The GeneXpert MRSA Assay (FDA approved for NASAL specimens only), is one component of a comprehensive MRSA colonization surveillance program. It is not intended to diagnose MRSA infection nor to guide or monitor treatment for MRSA infections.       Radiology Studies: Dg Chest 2 View  Result Date: 03/09/2017 CLINICAL DATA:  83 year old female with transient altered level of consciousness, left side weakness. EXAM: CHEST  2 VIEW COMPARISON:  Chest radiographs 12/15/2011. FINDINGS: Semi upright AP and lateral views of the chest.  Chronic moderate elevation of the right hemidiaphragm is stable. Stable cardiomegaly and tortuosity of the thoracic aorta. A degree of chronic central pulmonary artery enlargement is also suspected. Mediastinal contours are stable. Visualized tracheal air column is within normal limits. No pneumothorax, pulmonary edema or consolidation. Possible small right pleural effusion. Osteopenia. Negative visible bowel gas pattern. IMPRESSION: 1. Possible small right pleural effusion, but no other acute cardiopulmonary abnormality. 2. Chronic cardiomegaly. Electronically Signed   By: H  Hall M.D.   On: 03/09/2017 13:57   Mr Laqueta JeanBrain W Odessa FlemingZOWo Contrast  Result Date: 03/09/2017 CLINICAL DATA:  83 y/o  F; TIA. EXAM: MRI HEAD WITHOUT AND WITH CONTRAST TECHNIQUE: Multiplanar, multiecho pulse sequences of the brain and surrounding structures were obtained without and with intravenous contrast. CONTRAST:  10 cc MultiHance COMPARISON:  03/08/2017 CT of the head. FINDINGS: Brain: No acute infarction, hemorrhage, hydrocephalus, extra-axial collection or mass lesion. Several nonspecific foci of T2 FLAIR hyperintense signal abnormality in subcortical and periventricular white matter is compatible with moderate chronic microvascular ischemic changes.  Moderate brain parenchymal volume loss. T2 hyperintense focus in the right thalamus without reduced diffusion is compatible with a chronic lacunar infarction. No abnormal enhancement. Vascular: Normal flow voids. Skull and upper cervical spine: Normal marrow signal. Sinuses/Orbits: Bilateral mastoid effusions. No abnormal signal of paranasal sinuses. Bilateral intra-ocular lens replacement. Other: None. IMPRESSION: 1. No acute intracranial abnormality or abnormal enhancement of the brain. 2. Moderate for age chronic microvascular ischemic changes and moderate parenchymal volume loss of the brain. Right thalamus small chronic lacunar infarction. 3. Bilateral mastoid air cell effusions. Electronically Signed   By: Mitzi HansenLance  Furusawa-Stratton M.D.   On: 03/09/2017 21:40     Scheduled Meds: . aspirin EC  81 mg Oral Daily  . atorvastatin  20 mg Oral Daily  . clopidogrel  75 mg Oral Daily  . enoxaparin (LOVENOX) injection  30 mg Subcutaneous Q24H  . gabapentin  100 mg Oral BID  . lisinopril  20 mg Oral Daily  . metoprolol succinate  25 mg Oral Daily  . mirtazapine  7.5 mg Oral QHS   Continuous Infusions: . sodium chloride       LOS: 3 days   Time Spent in minutes   30 minutes  Penny PiaVEGA, Luz Burcher MD. on 03/11/2017 at 1:22 PM  Between 7am to 7pm - Pager - [phone]  After 7pm go to [email or web address] - password [email or web address]  And look for the night coverage person covering for me after hours  [facility] Office  [phone]

## 2017-03-12 ENCOUNTER — Ambulatory Visit (INDEPENDENT_AMBULATORY_CARE_PROVIDER_SITE_OTHER): Payer: Medicare Other | Admitting: Physician Assistant

## 2017-03-12 DIAGNOSIS — I5041 Acute combined systolic (congestive) and diastolic (congestive) heart failure: Secondary | ICD-10-CM

## 2017-03-12 LAB — BASIC METABOLIC PANEL
ANION GAP: 13 (ref 5–15)
ANION GAP: 12 (ref 5–15)
BUN: 78 mg/dL — ABNORMAL HIGH (ref 6–20)
BUN: 79 mg/dL — ABNORMAL HIGH (ref 6–20)
CHLORIDE: 98 mmol/L — AB (ref 101–111)
CO2: 24 mmol/L (ref 22–32)
CO2: 25 mmol/L (ref 22–32)
Calcium: 9.5 mg/dL (ref 8.9–10.3)
Calcium: 9.6 mg/dL (ref 8.9–10.3)
Chloride: 101 mmol/L (ref 101–111)
Creatinine, Ser: 1.84 mg/dL — ABNORMAL HIGH (ref 0.44–1.00)
Creatinine, Ser: 1.78 mg/dL — ABNORMAL HIGH (ref 0.44–1.00)
GFR calc Af Amer: 27 mL/min — ABNORMAL LOW (ref >60)
GFR calc Af Amer: 28 mL/min — ABNORMAL LOW (ref >60)
GFR calc non Af Amer: 24 mL/min — ABNORMAL LOW (ref >60)
GFR calc non Af Amer: 24 mL/min — ABNORMAL LOW (ref >60)
GLUCOSE: 127 mg/dL — AB (ref 65–99)
GLUCOSE: 115 mg/dL — AB (ref 65–99)
POTASSIUM: 4.6 mmol/L (ref 3.5–5.1)
POTASSIUM: 4.6 mmol/L (ref 3.5–5.1)
Sodium: 135 mmol/L (ref 135–145)
Sodium: 138 mmol/L (ref 135–145)

## 2017-03-12 MED ORDER — MIRTAZAPINE 15 MG PO TBDP: 7.5000 mg | ORAL_TABLET | Freq: Every day | ORAL | 0 refills | Status: DC

## 2017-03-12 MED ORDER — SODIUM CHLORIDE 0.9 % IV SOLN
INTRAVENOUS | Status: AC
  Administered 2017-03-12: 150 mL/h via INTRAVENOUS

## 2017-03-12 NOTE — Discharge Summary (Signed)
Physician Discharge Summary  [name] MoriHelen [name] ZOX:[id number] DOB: 1927/10/20 DOA: 03/07/2017  PCP: Kristian CoveyBurchette, Bruce W, MD  Admit date: 03/07/2017 Discharge date: 03/12/2017  Time spent: > 35 minutes  Recommendations for Outpatient Follow-up:  1) She will need a BMP drawn at her facility on 8/9.  If her renal function is better, start lasix 40mg  po daily.  2) Ensure follow up with Cardiologist 3) Lisinopril and Lasix on hold   Discharge Diagnoses:  Active Problems:   Essential hypertension   Hyperlipidemia   Neuropathic pain   Fall   Acute respiratory failure (HCC)   Acute on chronic diastolic CHF (congestive heart failure) (HCC)   Hyperglycemia   Acute on chronic combined systolic and diastolic CHF (congestive heart failure) (HCC)   Discharge Condition: stable  Diet recommendation: regular diet  Filed Weights   03/09/17 0615 03/10/17 0542 03/12/17 0527  Weight: 54.3 kg (119 lb 11.4 oz) 54.2 kg (119 lb 7.8 oz) 54.6 kg (120 lb 5.9 oz)    History of present illness:  83 year old female with history of coronary artery disease, hypertension, hyperlipidemia, presented with leg swelling and shortness of breath. Patient found to have systolic heart failure. Cardiology consulted.  Hospital Course:  Acute respiratory failure -Secondary to acute systolic heart failure -improved after diuresis.  Acute Systolic CHF -Cardiology consulted who recommended the following: Prior to admission she had a nuclear stress in 2009 that showed prior inferior infarct but no ischemia and LVEF 53%.  Echo this admission showed the known inferior akinesis but also inferolateral and anterolateral akinesis.  Systolic function was reduced to 35%.  She elected to defer an ischemia evaluation, so we are treating with medical management.  Over the weekend she developed acute renal failure and lasix was switched to PO on 8/4.  However renal function continued to worsen, so lasix was held 8/5.  She appears euvolemic.   We will given a 500 mL fluid bolus.  She is being discharged today.  She will need a BMP drawn at her facility on 8/9.  If her renal function is better, start lasix 40mg  po daily.  We will arrange cardiology follow up within one week.  Acute renal failure - Most likely secondary to overdiuresis with Lasix. Holding Lasix and lisinopril. Please see instructions above and below.  - reassess serum creatinine  Hyperkalemia - will hold K replacement. Pt is on lasix as such suspect K levels will drop - reassess K levels next am.  Coronary artery disease -Continue aspirin, statin, Plavix, metoprolol  Essential hypertension - metoprolol  Chronic pain/arthritis/physcial deconditioning -Patient uses a wheelchair, sometimes a walker.  -[name] -Patient did have fall -had hip fracture with ORIF in 2013 -PT and OT -continue pain control  Hypomagnesemia  -Within normal limits on last check at 2.1  Acute metabolic encephalopathy -CT head showed no acute abnormality -Neurology consulted and EEG obtained which is nonspecific there thinking patientMost likely had hypoxic event rather than seizures. MRI was done and was negative and neurology signed off.  Procedures:  EEG  Consultations:  Cardiology  Neurology  Discharge Exam: Vitals:   03/12/17 0527 03/12/17 1243  BP: 127/68 (!) 108/46  Pulse: 65 64  Resp: 18 18  Temp: 97.6 F (36.4 C) 97.7 F (36.5 C)    General: Pt in nad, alert and awake Cardiovascular: rrr, no rubs Respiratory: no increased wob, no wheezes  Discharge Instructions   Discharge Instructions    Call  MD for:  extreme fatigue    Complete by:  As directed    Call MD for:  temperature >100.4    Complete by:  As directed    Diet - low sodium heart healthy    Complete by:  As directed    Discharge instructions    Complete by:  As directed    She will need a BMP drawn at her facility on  8/9.  If her renal function is better, start lasix 40mg  po daily.   Increase activity slowly    Complete by:  As directed      Current Discharge Medication List    START taking these medications   Details  pantoprazole (PROTONIX) 40 MG tablet Take 1 tablet (40 mg total) by mouth daily. Qty: 30 tablet, Refills: 3      CONTINUE these medications which have CHANGED   Details  mirtazapine (REMERON SOL-TAB) 15 MG disintegrating tablet Take 0.5 tablets (7.5 mg total) by mouth at bedtime. Qty: 30 tablet, Refills: 0      CONTINUE these medications which have NOT CHANGED   Details  acetaminophen (TYLENOL) 500 MG tablet Take 1,000 mg by mouth every 6 (six) hours as needed for mild pain.    aspirin EC 81 MG tablet Take 81 mg by mouth daily.    atorvastatin (LIPITOR) 20 MG tablet TAKE 1 TABLET DAILY Qty: 90 tablet, Refills: 1    clopidogrel (PLAVIX) 75 MG tablet Take 75 mg by mouth daily.    diphenoxylate-atropine (LOMOTIL) 2.5-0.025 MG tablet Take 1 tablet by mouth 4 (four) times daily as needed for diarrhea or loose stools.    gabapentin (NEURONTIN) 100 MG capsule TAKE 1 CAPSULE TWICE A DAY Qty: 180 capsule, Refills: 2    metoprolol succinate (TOPROL-XL) 25 MG 24 hr tablet Take 1 tablet (25 mg total) by mouth daily. Take with or immediately following a meal. Qty: 30 tablet, Refills: 1      STOP taking these medications     lisinopril (PRINIVIL,ZESTRIL) 20 MG tablet      loperamide (IMODIUM A-D) 2 MG tablet        Allergies  Allergen Reactions  . Penicillins Swelling    Swelling of hands and face Has patient had a PCN reaction causing immediate rash, facial/tongue/throat swelling, SOB or lightheadedness with hypotension: unknown Has patient had a PCN reaction causing severe rash involving mucus membranes or skin necrosis: no Has patient had a PCN reaction that required hospitalization: unknown Has patient had a PCN reaction occurring within the last 10 years: No If all of  the above answers are "NO", then may proceed with Cephalosporin use.     Contact information for follow-up providers    Marykay Lex, MD Follow up on 03/20/2017.   Specialty:  Cardiology Why:  at 10:30 Am with Azalee Course, PA Contact information: [address] [place] [address] [phone]            Contact information for after-discharge care    Destination    [facility] Follow up.   Specialty:  Skilled Nursing Facility Contact information: [address] [place] [address] [phone]                   The results of significant diagnostics from this hospitalization (including imaging, microbiology, ancillary and laboratory) are listed below for reference.    Significant Diagnostic Studies: Dg Chest 2 View  Result Date: 03/09/2017 CLINICAL DATA:  83 year old female  with transient altered level of consciousness, left side weakness. EXAM: CHEST  2 VIEW COMPARISON:  Chest radiographs 12/15/2011. FINDINGS: Semi upright AP and lateral views of the chest. Chronic moderate elevation of the right hemidiaphragm is stable. Stable cardiomegaly and tortuosity of the thoracic aorta. A degree of chronic central pulmonary artery enlargement is also suspected. Mediastinal contours are stable. Visualized tracheal air column is within normal limits. No pneumothorax, pulmonary edema or consolidation. Possible small right pleural effusion. Osteopenia. Negative visible bowel gas pattern. IMPRESSION: 1. Possible small right pleural effusion, but no other acute cardiopulmonary abnormality. 2. Chronic cardiomegaly. Electronically Signed   By: Odessa Fleming M.D.   On: 03/09/2017 13:57   Mr Laqueta Jean AO Contrast  Result Date: 03/09/2017 CLINICAL DATA:  83 y/o  F; TIA. EXAM: MRI HEAD WITHOUT AND WITH CONTRAST TECHNIQUE: Multiplanar, multiecho pulse sequences of the brain and surrounding structures were obtained without and with intravenous contrast. CONTRAST:   10 cc MultiHance COMPARISON:  03/08/2017 CT of the head. FINDINGS: Brain: No acute infarction, hemorrhage, hydrocephalus, extra-axial collection or mass lesion. Several nonspecific foci of T2 FLAIR hyperintense signal abnormality in subcortical and periventricular white matter is compatible with moderate chronic microvascular ischemic changes. Moderate brain parenchymal volume loss. T2 hyperintense focus in the right thalamus without reduced diffusion is compatible with a chronic lacunar infarction. No abnormal enhancement. Vascular: Normal flow voids. Skull and upper cervical spine: Normal marrow signal. Sinuses/Orbits: Bilateral mastoid effusions. No abnormal signal of paranasal sinuses. Bilateral intra-ocular lens replacement. Other: None. IMPRESSION: 1. No acute intracranial abnormality or abnormal enhancement of the brain. 2. Moderate for age chronic microvascular ischemic changes and moderate parenchymal volume loss of the brain. Right thalamus small chronic lacunar infarction. 3. Bilateral mastoid air cell effusions. Electronically Signed   By: Mitzi Hansen M.D.   On: 03/09/2017 21:40   Dg Chest Port 1 View  Result Date: 03/07/2017 CLINICAL DATA:  Increasing shortness of breath. Bilateral lower extremity edema. EXAM: PORTABLE CHEST 1 VIEW COMPARISON:  12/15/2011 FINDINGS: The patient has and Kerley B-lines at the right lung base consistent with mild interstitial edema. Heart size is normal. Slight atelectasis at the lung bases. I suspect there small bilateral effusions. No acute bone abnormality. IMPRESSION: Mild interstitial edema with slight atelectasis at the bases. Probable small effusions. Electronically Signed   By: Francene Boyers M.D.   On: 03/07/2017 10:43   Ct Head Code Stroke Wo Contrast  Result Date: 03/08/2017 CLINICAL DATA:  Code stroke. 83 y/o F; left facial droop and weakness, now resolved. EXAM: CT HEAD WITHOUT CONTRAST TECHNIQUE: Contiguous axial images were obtained from  the base of the skull through the vertex without intravenous contrast. COMPARISON:  01/06/2016 CT head FINDINGS: Brain: Interval subcentimeter lucency in right thalamus compatible with age-indeterminate lacunar infarction. No evidence for large vascular territory infarct, intracranial hemorrhage, or focal mass effect. Patchy nonspecific foci of hypoattenuation subcortical and periventricular white matter compatible with chronic microvascular ischemic changes and there is parenchymal volume loss stable prior CT. Vascular: Extensive calcific atherosclerosis of vertebrobasilar system and carotid siphons. No hyperdense vessel identified. Skull: Normal. Negative for fracture or focal lesion. Sinuses/Orbits: Bilateral mastoid effusions. Normally aerated paranasal sinuses. Bilateral intra-ocular lens replacement. Other: None. ASPECTS [facility] Stroke Program Early CT Score) - Ganglionic level infarction (caudate, lentiform nuclei, internal capsule, insula, M1-M3 cortex): 7 - Supraganglionic infarction (M4-M6 cortex): 3 Total score (0-10 with 10 being normal): 10 IMPRESSION: 1. Interval subcentimeter lucency in right thalamus compatible with age-indeterminate lacunar infarction. 2.  No acute vascular territory infarction or intracranial hemorrhage. 3. ASPECTS is 10. 4. Stable chronic microvascular ischemic changes and parenchymal volume loss. These results were called by telephone at the time of interpretation on 03/08/2017 at 5:37 pm to Dr. Arther Dames , who verbally acknowledged these results. Electronically Signed   By: Mitzi Hansen M.D.   On: 03/08/2017 17:45    Microbiology: Recent Results (from the past 240 hour(s))  MRSA PCR Screening     Status: None   Collection Time: 03/08/17  7:59 PM  Result Value Ref Range Status   MRSA by PCR NEGATIVE NEGATIVE Final    Comment:        The GeneXpert MRSA Assay (FDA approved for NASAL specimens only), is one component of a comprehensive MRSA  colonization surveillance program. It is not intended to diagnose MRSA infection nor to guide or monitor treatment for MRSA infections.      Labs: Basic Metabolic Panel:  Recent Labs Lab 03/08/17 0314 03/09/17 0422 03/09/17 1219 03/10/17 0335 03/11/17 0349 03/12/17 0026 03/12/17 0406  NA 142 141  --  140 138 135 138  K 3.4* 4.6  --  5.6* 4.7 4.6 4.6  CL 105 103  --  103 101 98* 101  CO2 27 27  --  28 26 24 25   GLUCOSE 115* 115*  --  120* 140* 127* 115*  BUN 15 26*  --  39* 60* 78* 79*  CREATININE 0.92 1.02*  --  1.22* 1.82* 1.84* 1.78*  CALCIUM 9.5 9.8  --  9.9 9.7 9.5 9.6  MG 1.3*  --  1.4*  --  2.1  --   --   PHOS 2.8  --   --   --   --   --   --    Liver Function Tests:  Recent Labs Lab 03/07/17 1025  AST 21  ALT 14  ALKPHOS 58  BILITOT 0.9  PROT 6.2*  ALBUMIN 4.0   No results for input(s): LIPASE, AMYLASE in the last 168 hours. No results for input(s): AMMONIA in the last 168 hours. CBC:  Recent Labs Lab 03/07/17 1025 03/07/17 2139 03/08/17 0314 03/10/17 0335  WBC 8.7 9.6 9.0 10.7*  HGB 13.0 12.9 12.8 13.3  HCT 39.7 39.8 39.8 41.2  MCV 91.9 92.6 92.8 92.6  PLT 258 254 246 299   Cardiac Enzymes: No results for input(s): CKTOTAL, CKMB, CKMBINDEX, TROPONINI in the last 168 hours. BNP: BNP (last 3 results)  Recent Labs  03/07/17 1025  BNP 703.6*    ProBNP (last 3 results) No results for input(s): PROBNP in the last 8760 hours.  CBG: No results for input(s): GLUCAP in the last 168 hours.   Signed:  Penny Pia MD.  [facility] 03/12/2017, 3:08 PM

## 2017-03-12 NOTE — Progress Notes (Signed)
NS @ 17650ml/hr x 3 hrs has infused and patient's IV access flushed with NS.

## 2017-03-12 NOTE — Progress Notes (Signed)
[place] called to [place], Charity fundraiserN, at [facility].  `Next `dose due for medications entered on AVS and 2 meds needed tonight, Remeron and Neurontin, were reported to RN.  Patient has glasses on and bilateral hearing aides in ears.  Glasses case and hearing aid battery in belongings bag, packed for transport.  Patient has t-shirt to wear when discharged.  Will dress in paper gown prior to discharge.  Patient aware that transport has been called.

## 2017-03-12 NOTE — Progress Notes (Signed)
Physical Therapy Treatment Patient Details Name: [name] [name] MRN: [id number] DOB: 1928/04/09 Today's Date: 03/12/2017    History of Present Illness Pt is a 83 yo female brought from SNF with increasing shortness of breath, dx acute respiratory failuremedical history significant of sciatica, peripheral take, CAD/MI, hyperlipidemia, hypertension, hip fracture, hearing loss.    PT Comments    Patient required +2 assist for bed mobility and attempts to stand from EOB. Continue to progress as tolerated with anticipated d/c to SNF for further skilled PT services.     Follow Up Recommendations  SNF     Equipment Recommendations  None recommended by PT    Recommendations for Other Services OT consult     Precautions / Restrictions Precautions Precautions: Fall Restrictions Weight Bearing Restrictions: No    Mobility  Bed Mobility Overal bed mobility: Needs Assistance Bed Mobility: Supine to Sit;Sit to Supine     Supine to sit: Max assist Sit to supine: Max assist;+2 for physical assistance   General bed mobility comments: cues for sequencing and technique; use of bed rail and HOB elevated; assist at bilat LE and trunk   Transfers Overall transfer level: Needs assistance Equipment used: Rolling walker (2 wheeled);2 person hand held assist (face to face with use of gait belt and bed pad) Transfers: Sit to/from Stand Sit to Stand: From elevated surface;Mod assist         General transfer comment: max A +2; attempted X3 to stand from EOB but unable to lift buttocks from surface  Ambulation/Gait                 Stairs            Wheelchair Mobility    Modified Rankin (Stroke Patients Only)       Balance Overall balance assessment: Needs assistance Sitting-balance support: Feet supported;Bilateral upper extremity supported Sitting balance-Leahy Scale: Fair                                      Cognition Arousal/Alertness:  Awake/alert Behavior During Therapy: WFL for tasks assessed/performed;Anxious Overall Cognitive Status: Within Functional Limits for tasks assessed                                        Exercises      General Comments        Pertinent Vitals/Pain Pain Assessment: Faces Faces Pain Scale: Hurts little more Pain Location: "all over" Pain Descriptors / Indicators: Sore;Aching Pain Intervention(s): Limited activity within patient's tolerance;Monitored during session;Premedicated before session;Repositioned    Home Living                      Prior Function            PT Goals (current goals can now be found in the care plan section) Acute Rehab PT Goals Patient Stated Goal: go back to [facility] Progress towards PT goals: Progressing toward goals    Frequency    Min 2X/week      PT Plan Current plan remains appropriate    Co-evaluation              AM-PAC PT "6 Clicks" Daily Activity  Outcome Measure  Difficulty turning over in bed (including adjusting bedclothes, sheets and blankets)?: Total Difficulty moving from lying on back  to sitting on the side of the bed? : Total Difficulty sitting down on and standing up from a chair with arms (e.g., wheelchair, bedside commode, etc,.)?: Total Help needed moving to and from a bed to chair (including a wheelchair)?: Total Help needed walking in hospital room?: Total Help needed climbing 3-5 steps with a railing? : Total 6 Click Score: 6    End of Session Equipment Utilized During Treatment: Gait belt Activity Tolerance: Patient tolerated treatment well Patient left: in bed;with call bell/phone within reach;with nursing/sitter in room;with bed alarm set Nurse Communication: Mobility status PT Visit Diagnosis: Repeated falls (R29.6);History of falling (Z91.81);Muscle weakness (generalized) (M62.81);Other abnormalities of gait and mobility (R26.89);Unsteadiness on feet (R26.81);Difficulty in  walking, not elsewhere classified (R26.2);Pain Pain - Right/Left: Right Pain - part of body: Hip;Leg     Time: [id number] PT Time Calculation (min) (ACUTE ONLY): 24 min  Charges:  $Therapeutic Activity: 23-37 mins                    G Codes:       Erline LevineKellyn Ko Bardon, PTA Pager: [phone]     Carolynne EdouardKellyn R Blessen Kimbrough 03/12/2017, 1:38 PM

## 2017-03-12 NOTE — Progress Notes (Signed)
Discharge to: [facility] Anticipated discharge date: 03/12/17 Transportation by: [facility]  Report #: [phone]  CSW signing off.  Blenda Nicelylizabeth Ardyce Heyer LCSW [phone]

## 2017-03-12 NOTE — Progress Notes (Signed)
Progress Note  Patient Name: [name] [name] Date of Encounter: 03/12/2017  Primary Cardiologist: Dr. Herbie Baltimore  Subjective   Feeling well.  Wants to go home.  Inpatient Medications    Scheduled Meds: . aspirin EC  81 mg Oral Daily  . atorvastatin  20 mg Oral Daily  . clopidogrel  75 mg Oral Daily  . enoxaparin (LOVENOX) injection  30 mg Subcutaneous Q24H  . gabapentin  100 mg Oral BID  . metoprolol succinate  25 mg Oral Daily  . mirtazapine  7.5 mg Oral QHS   Continuous Infusions: . sodium chloride     PRN Meds: acetaminophen **OR** acetaminophen, loperamide **OR** diphenoxylate-atropine, ondansetron **OR** ondansetron (ZOFRAN) IV   Vital Signs    Vitals:   03/11/17 0922 03/11/17 1200 03/11/17 2009 03/12/17 0527  BP: 120/60 116/62 (!) 114/57 127/68  Pulse: 76 70 71 65  Resp:  18 18 18   Temp:   98.3 F (36.8 C) 97.6 F (36.4 C)  TempSrc:   Oral Oral  SpO2:  95% 97% 93%  Weight:    54.6 kg (120 lb 5.9 oz)  Height:        Intake/Output Summary (Last 24 hours) at 03/12/17 0944 Last data filed at 03/12/17 0904  Gross per 24 hour  Intake              600 ml  Output              950 ml  Net             -350 ml   Filed Weights   03/09/17 0615 03/10/17 0542 03/12/17 0527  Weight: 54.3 kg (119 lb 11.4 oz) 54.2 kg (119 lb 7.8 oz) 54.6 kg (120 lb 5.9 oz)    Telemetry    Sinus rhythm.  Episodes of Mobitz I overnight - Personally Reviewed  ECG    n/a - Personally Reviewed  Physical Exam   VS:  BP 127/68 (BP Location: Right Arm)   Pulse 65   Temp 97.6 F (36.4 C) (Oral)   Resp 18   Ht 5\' 4"  (1.626 m)   Wt 54.6 kg (120 lb 5.9 oz)   LMP  (LMP Unknown)   SpO2 93%   BMI 20.66 kg/m  , BMI Body mass index is 20.66 kg/m. GENERAL:  Well appearing frail woman in no acute distress.  HEENT: Pupils equal round and reactive, fundi not visualized, oral mucosa unremarkable NECK:  No jugular venous distention, waveform within normal limits, carotid upstroke brisk and  symmetric, no bruits LUNGS:  Clear to auscultation bilaterally.  No crackles, wheezes or rhonchi HEART:  RRR.  PMI not displaced or sustained,S1 and S2 within normal limits, no S3, no S4, no clicks, no rubs, no murmurs ABD:  Flat, positive bowel sounds normal in frequency in pitch, no bruits, no rebound, no guarding, no midline pulsatile mass, no hepatomegaly, no splenomegaly EXT:  2 plus pulses throughout, no edema, no cyanosis no clubbing SKIN:  No rashes no nodules NEURO:  Cranial nerves II through XII grossly intact, motor grossly intact throughout PSYCH:  Cognitively intact, oriented to person place and time   Labs    Chemistry Recent Labs Lab 03/07/17 1025  03/11/17 0349 03/12/17 0026 03/12/17 0406  NA 140  < > 138 135 138  K 3.3*  < > 4.7 4.6 4.6  CL 107  < > 101 98* 101  CO2 26  < > 26 24 25   GLUCOSE 165*  < >  140* 127* 115*  BUN 19  < > 60* 78* 79*  CREATININE 1.01*  < > 1.82* 1.84* 1.78*  CALCIUM 9.7  < > 9.7 9.5 9.6  PROT 6.2*  --   --   --   --   ALBUMIN 4.0  --   --   --   --   AST 21  --   --   --   --   ALT 14  --   --   --   --   ALKPHOS 58  --   --   --   --   BILITOT 0.9  --   --   --   --   GFRNONAA 49*  < > 24* 24* 24*  GFRAA 56*  < > 28* 27* 28*  ANIONGAP 7  < > 11 13 12   < > = values in this interval not displayed.   Hematology Recent Labs Lab 03/07/17 2139 03/08/17 0314 03/10/17 0335  WBC 9.6 9.0 10.7*  RBC 4.30 4.29 4.45  HGB 12.9 12.8 13.3  HCT 39.8 39.8 41.2  MCV 92.6 92.8 92.6  MCH 30.0 29.8 29.9  MCHC 32.4 32.2 32.3  RDW 14.9 14.7 14.4  PLT 254 246 299    Cardiac EnzymesNo results for input(s): TROPONINI in the last 168 hours.  Recent Labs Lab 03/07/17 1034  TROPIPOC 0.12*     BNP Recent Labs Lab 03/07/17 1025  BNP 703.6*     DDimer No results for input(s): DDIMER in the last 168 hours.   Radiology    No results found.  Cardiac Studies   Echo 03/07/17: Study Conclusions  - Left ventricle: The cavity size was  normal. Wall thickness was   increased in a pattern of mild LVH. Basal to mid inferior   akinesis. Inferolateral akinesis. Basal to mid anterolateral   akinesis. The estimated ejection fraction was 35%. Doppler   parameters are consistent with abnormal left ventricular   relaxation (grade 1 diastolic dysfunction). - Aortic valve: Trileaflet; moderately calcified leaflets.   Sclerosis without stenosis. There was mild regurgitation. - Mitral valve: There was mild regurgitation. - Left atrium: The atrium was mildly to moderately dilated. - Right ventricle: The cavity size was moderately dilated. Systolic   function was mildly to moderately reduced. - Right atrium: The atrium was mildly dilated. - Tricuspid valve: Peak RV-RA gradient (S): 62 mm Hg. - Pulmonic valve: There was moderate to severe regurgitation. - Systemic veins: IVC not visualized.  Impressions:  - Normal LV size with mild LV hypertrophy. EF 35% with wall motion   abnormalities as noted above. Aortic sclerosis without   significant stenosis. Moderately dilated RV with mild to   moderately decreased systolic function. Moderate to severe   pulmonic insufficiency (this was noted on prior echo as well).   Moderate to severe pulmonary hypertension.  Patient Profile     83 y.o. female with CAD s/p MI/PCI in 2002, hypertension, hyperlipidemia   Assessment & Plan    # Acute systolic and diastolic heart failure: Prior to admission she had a nuclear stress in 2009 that showed prior inferior infarct but no ischemia and LVEF 53%.  Echo this admission showed the known inferior akinesis but also inferolateral and anterolateral akinesis.  Systolic function was reduced to 35%.  She elected to defer an ischemia evaluation, so we are treating with medical management.  Over the weekend she developed acute renal failure and lasix was switched to PO on 8/4.  However renal function continued to worsen, so lasix was held 8/5.  She appears  euvolemic.  We will given a 500 mL fluid bolus.  She is being discharged today.  She will need a BMP drawn at her facility on 8/9.  If her renal function is better, start lasix 40mg  po daily.  We will arrange cardiology follow up within one week.  # CAD s/p PCI: Medical management as above.  Continue aspirin, clopidogrel, atorvastatin and metoprolol.   # Hypertension:  BP controlled on metoprolol  # Mobitz I: Asymptomatic and only occurring at night.  Continue metoprolol.  # Acute renal failure:  Baseline creatinine is 0.9.  It remains elevated at 1.78 today, though marginally improved from 1.84 yesterday.  Continue to hold diuretics.  She is net -6L this admission.  Will give a gentle 500 mL fluid bolus.     Signed, Chilton Siiffany [place], MD  03/12/2017, 9:44 AM

## 2017-03-12 NOTE — Progress Notes (Signed)
[facility].  They took patient belonging bag with them.

## 2017-03-19 ENCOUNTER — Telehealth (INDEPENDENT_AMBULATORY_CARE_PROVIDER_SITE_OTHER): Payer: Self-pay | Admitting: Orthopaedic Surgery

## 2017-03-19 NOTE — Telephone Encounter (Signed)
Returned call to [employer] ProductsConnie [name] left message on voicemail for a call back  [phone]

## 2017-03-20 ENCOUNTER — Ambulatory Visit (INDEPENDENT_AMBULATORY_CARE_PROVIDER_SITE_OTHER): Payer: Medicare Other | Admitting: Physician Assistant

## 2017-03-20 ENCOUNTER — Encounter: Payer: Self-pay | Admitting: Physician Assistant

## 2017-03-20 VITALS — BP 135/70 | HR 63 | Ht 64.0 in | Wt 125.0 lb

## 2017-03-20 DIAGNOSIS — I10 Essential (primary) hypertension: Secondary | ICD-10-CM | POA: Diagnosis not present

## 2017-03-20 DIAGNOSIS — I5022 Chronic systolic (congestive) heart failure: Secondary | ICD-10-CM | POA: Diagnosis not present

## 2017-03-20 DIAGNOSIS — I2510 Atherosclerotic heart disease of native coronary artery without angina pectoris: Secondary | ICD-10-CM | POA: Diagnosis not present

## 2017-03-20 DIAGNOSIS — E785 Hyperlipidemia, unspecified: Secondary | ICD-10-CM | POA: Diagnosis not present

## 2017-03-20 DIAGNOSIS — I519 Heart disease, unspecified: Secondary | ICD-10-CM

## 2017-03-20 MED ORDER — FUROSEMIDE 40 MG PO TABS: 40.0000 mg | ORAL_TABLET | Freq: Every day | ORAL | 3 refills | Status: DC

## 2017-03-20 MED ORDER — FUROSEMIDE 40 MG PO TABS: 40.0000 mg | ORAL_TABLET | Freq: Every day | ORAL | 2 refills | Status: DC

## 2017-03-20 NOTE — Patient Instructions (Signed)
Medication Instructions: START Lasix 40 mg daily. Your physician recommends that you weigh, weekly, in the morning, and in the same amount of clothing. Please record your daily weights on the handout provided and bring it to your next appointment.  Labwork: Your physician recommends that you have lab work done in: 1 week--BMET ([facility]) ~~Please have lab results faxed to us at [phone]) [phone] ATTN: Azalee CourseHao Meng, PA~~  Follow-Up: We request that you follow-up in: 3-4 weeks with Azalee CourseHao Meng, PA and in 3 months with Dr. Herbie BaltimoreHarding.  If you need a refill on your cardiac medications before your next appointment, please call your pharmacy.

## 2017-03-20 NOTE — Progress Notes (Signed)
Cardiology Office Note    Date:  03/21/2017   ID:  [name] [name], DOB [birth year], MRN [id number]  PCP:  Kristian Covey, MD  Cardiologist:  Dr. Elta Guadeloupe  Chief Complaint  Patient presents with  . Follow-up    seen for Dr. Herbie Baltimore    History of Present Illness:  [name] [name] is a 83 y.o. female with PMH of HTN, HLD, neuropathy, sciatica and CAD. She had remote stenting in 2002, Myoview in 2009 showed EF 53%, no obvious ischemia. She was last seen in 2013, however lost to follow-up since. Recently, She presented to the hospital with worsening shortness of breath, baseline orthopnea and hypoxia in the 70s. Echocardiogram revealed a new systolic heart failure with reduced EF down to 35%. Previously was normal in 2017. Patient also developed a left facial droop, left upper extremity drift, and dysarthria, code stroke was initiated. CT of the head was negative, symptom resolved. MRI of the brain obtained on 03/09/2017 was negative for acute etiology, does show chronic microvascular ischemic changes. Cardiology service was consulted and the patient was diuresed with IV Lasix. Given lack of chest discomfort, inpatient ischemic workup was deferred. She was also not interested in invasive study if the stress test showed abnormality. She was initially placed on 80 mg twice a day of Lasix, however due to her worsening renal function, both lisinopril and Lasix were held prior to discharge.   She presents today for cardiology office visit. Recent lab obtained on 03/15/2017 shows a creatinine 1.11. She currently resides in for long-term care. She most recently has been seen by geriatric physician, based on outside note on 03/13/2017, the plan is to resume ACE inhibitor and diuretic once her renal function improve. She presents today for cardiology office visit. She does not have significant lower extremity edema, orthopnea or PND. She denies any recent chest discomfort. Given the fact that her renal function  seems to have improved, I will resume Lasix at 40 mg daily. She will need a basic metabolic panel in one week. I will see her in 2-3 weeks, if renal function stable, will continue to uptitrate heart failure medication. Once heart failure medication has been maximized, may consider repeating echocardiogram later. Currently the main issues is actually bothering her as her sciatica which causes significant left lower extremity pain.    Past Medical History:  Diagnosis Date  . Arthritis   . CAD (coronary artery disease)    a. remote MI/stenting in [place] in 2002. b. Nuc 2009: prior inferior MI but no evidence of ischemia, EF 53% with akinesis of inferior wall.  [name] Hearing loss    a. bilat hearing aids.  . Hip fracture (HCC)    a. s/p prior ORIF.  [name] HTN (hypertension)   . Hyperlipidemia   . MI (myocardial infarction) (HCC) 2002  . Neuropathy   . Sciatica     Past Surgical History:  Procedure Laterality Date  . ABDOMINAL HYSTERECTOMY    . CATARACT EXTRACTION  1998   RIGHT AND LEFT EYE  . CHOLECYSTECTOMY  2000  . COMPRESSION HIP SCREW  12/16/2011   Procedure: COMPRESSION HIP;  Surgeon: Kerrin Champagne, MD;  Location: [facility];  Service: Orthopedics;  Laterality: Right;  . DILATION AND CURETTAGE OF UTERUS  1960  . HYSTRECTOMY    . PTCA  2002  . REFRACTIVE SURGERY  2002    Current Medications: Outpatient Medications Prior to Visit  Medication Sig Dispense Refill  . acetaminophen (TYLENOL) 500 MG tablet  Take 1,000 mg by mouth every 6 (six) hours as needed for mild pain.    [name] aspirin EC 81 MG tablet Take 81 mg by mouth daily.    [name] atorvastatin (LIPITOR) 20 MG tablet TAKE 1 TABLET DAILY 90 tablet 1  . clopidogrel (PLAVIX) 75 MG tablet Take 75 mg by mouth daily.    . diphenoxylate-atropine (LOMOTIL) 2.5-0.025 MG tablet Take 1 tablet by mouth 4 (four) times daily as needed for diarrhea or loose stools.    . gabapentin (NEURONTIN) 100 MG capsule TAKE 1 CAPSULE TWICE A DAY (Patient taking  differently: TAKE 100MG  BY MOUTH TWICE DAILY) 180 capsule 2  . metoprolol succinate (TOPROL-XL) 25 MG 24 hr tablet Take 1 tablet (25 mg total) by mouth daily. Take with or immediately following a meal. 30 tablet 1  . mirtazapine (REMERON SOL-TAB) 15 MG disintegrating tablet Take 0.5 tablets (7.5 mg total) by mouth at bedtime. 30 tablet 0  . pantoprazole (PROTONIX) 40 MG tablet Take 1 tablet (40 mg total) by mouth daily. 30 tablet 3   No facility-administered medications prior to visit.      Allergies:   Penicillins   Social History   Social History  . Marital status: Single    Spouse name: N/A  . Number of children: N/A  . Years of education: N/A   Social History Main Topics  . Smoking status: Never Smoker  . Smokeless tobacco: Never Used  . Alcohol use No  . Drug use: No  . Sexual activity: No   Other Topics Concern  . None   Social History Narrative  . None     Family History:  The patient's family history includes Aneurysm (age of onset: 4) in her brother; Cancer (age of onset: 43) in her sister; Hypertension in her father; Pneumonia (age of onset: 37) in her mother.   ROS:   Please see the history of present illness.    ROS All other systems reviewed and are negative.   PHYSICAL EXAM:   VS:  BP 135/70   Pulse 63   Ht 5\' 4"  (1.626 m)   Wt 125 lb (56.7 kg)   LMP  (LMP Unknown)   BMI 21.46 kg/m    GEN: Well nourished, well developed, in no acute distress  HEENT: normal  Neck: no JVD, carotid bruits, or masses Cardiac: RRR; no murmurs, rubs, or gallops,no edema  Respiratory:  clear to auscultation bilaterally, normal work of breathing GI: soft, nontender, nondistended, + BS MS: no deformity or atrophy  Skin: warm and dry, no rash Neuro:  Alert and Oriented x 3, Strength and sensation are intact Psych: euthymic mood, full affect  Wt Readings from Last 3 Encounters:  03/20/17 125 lb (56.7 kg)  03/12/17 120 lb 5.9 oz (54.6 kg)  09/22/15 136 lb 11 oz (62 kg)        Studies/Labs Reviewed:   EKG:  EKG is not ordered today.    Recent Labs: 03/07/2017: ALT 14; B Natriuretic Peptide 703.6 03/10/2017: Hemoglobin 13.3; Platelets 299 03/11/2017: Magnesium 2.1 03/12/2017: BUN 79; Creatinine, Ser 1.78; Potassium 4.6; Sodium 138   Lipid Panel    Component Value Date/Time   CHOL 133 02/18/2015 1141   TRIG 84.0 02/18/2015 1141   HDL 53.30 02/18/2015 1141   CHOLHDL 2 02/18/2015 1141   VLDL 16.8 02/18/2015 1141   LDLCALC 63 02/18/2015 1141    Additional studies/ records that were reviewed today include:    Echo 09/23/2015 LV EF: 55% -  60%  Study Conclusions  - Left ventricle: The cavity size was normal. There was mild   concentric hypertrophy. Systolic function was normal. The   estimated ejection fraction was in the range of 55% to 60%. Wall   motion was normal; there were no regional wall motion   abnormalities. Features are consistent with a pseudonormal left   ventricular filling pattern, with concomitant abnormal relaxation   and increased filling pressure (grade 2 diastolic dysfunction).   Doppler parameters are consistent with high ventricular filling   pressure. - Aortic valve: Moderate thickening. Mild diffuse calcification.   There was mild regurgitation. - Mitral valve: There was mild regurgitation. - Left atrium: The atrium was moderately dilated. - Right ventricle: Systolic function was moderately to severely   reduced. - Pulmonic valve: There was moderate to severe regurgitation. - Pulmonary arteries: PA peak pressure: 70 mm Hg (S).  Impressions:  - The right ventricular systolic pressure was increased consistent   with severe pulmonary hypertension.    Echo 03/07/2017 LV EF: 35%  Study Conclusions  - Left ventricle: The cavity size was normal. Wall thickness was   increased in a pattern of mild LVH. Basal to mid inferior   akinesis. Inferolateral akinesis. Basal to mid anterolateral   akinesis. The estimated  ejection fraction was 35%. Doppler   parameters are consistent with abnormal left ventricular   relaxation (grade 1 diastolic dysfunction). - Aortic valve: Trileaflet; moderately calcified leaflets.   Sclerosis without stenosis. There was mild regurgitation. - Mitral valve: There was mild regurgitation. - Left atrium: The atrium was mildly to moderately dilated. - Right ventricle: The cavity size was moderately dilated. Systolic   function was mildly to moderately reduced. - Right atrium: The atrium was mildly dilated. - Tricuspid valve: Peak RV-RA gradient (S): 62 mm Hg. - Pulmonic valve: There was moderate to severe regurgitation. - Systemic veins: IVC not visualized.  Impressions:  - Normal LV size with mild LV hypertrophy. EF 35% with wall motion   abnormalities as noted above. Aortic sclerosis without   significant stenosis. Moderately dilated RV with mild to   moderately decreased systolic function. Moderate to severe   pulmonic insufficiency (this was noted on prior echo as well).   Moderate to severe pulmonary hypertension.    MRI of brain 03/09/2017 IMPRESSION: 1. No acute intracranial abnormality or abnormal enhancement of the brain. 2. Moderate for age chronic microvascular ischemic changes and moderate parenchymal volume loss of the brain. Right thalamus small chronic lacunar infarction. 3. Bilateral mastoid air cell effusions.    ASSESSMENT:    1. Chronic systolic heart failure (HCC)   2. Essential hypertension   3. LV dysfunction   4. Hyperlipidemia, unspecified hyperlipidemia type   5. Coronary artery disease involving native coronary artery of native heart without angina pectoris      PLAN:  In order of problems listed above:  1. Chronic systolic heart failure: New LV dysfunction on recent echocardiogram, EF previously 55% in 2017, now down to 35%. Decision was made in the hospital not to pursue cardiac catheterization. Lisinopril and diuretic was  held prior to discharge due to renal issue, renal function is normalizing. We'll restart 40 mg Lasix today. We will bring the patient back in 2-3 weeks for further heart failure medication titration.   2. CAD: Despite the recent drop in ejection fraction, she denies any obvious chest pain.  3. Hypertension: Blood pressure stable 135/70. Restarting the Lasix today, basic metabolic panel in one week, she  will return in 2-3 weeks for heart failure medication titration.  4. Hyperlipidemia: On Lipitor 20 mg daily.    Medication Adjustments/Labs and Tests Ordered: Current medicines are reviewed at length with the patient today.  Concerns regarding medicines are outlined above.  Medication changes, Labs and Tests ordered today are listed in the Patient Instructions below. Patient Instructions  Medication Instructions: START Lasix 40 mg daily. Your physician recommends that you weigh, weekly, in the morning, and in the same amount of clothing. Please record your daily weights on the handout provided and bring it to your next appointment.  Labwork: Your physician recommends that you have lab work done in: 1 week--BMET ([facility]) ~~Please have lab results faxed to us at [phone]) [phone] ATTN: Azalee CourseHao Dewan [place], PA~~  Follow-Up: We request that you follow-up in: 3-4 weeks with Azalee CourseHao Caylan Schifano, PA and in 3 months with Dr. Herbie BaltimoreHarding.  If you need a refill on your cardiac medications before your next appointment, please call your pharmacy.     Ramond DialSigned, [place]ham [place], [place]  03/21/2017 11:19 PM    [facility] [address], [place], [place]  [address] Phone: [phone](336) [phone]; Fax: [phone](336) [phone]

## 2017-03-21 ENCOUNTER — Encounter: Payer: Self-pay | Admitting: Physician Assistant

## 2017-03-21 ENCOUNTER — Telehealth: Payer: Self-pay | Admitting: Physician Assistant

## 2017-03-21 ENCOUNTER — Other Ambulatory Visit: Payer: Self-pay

## 2017-03-21 MED ORDER — FUROSEMIDE 40 MG PO TABS: 40.0000 mg | ORAL_TABLET | Freq: Every day | ORAL | 0 refills | Status: DC

## 2017-03-21 NOTE — Telephone Encounter (Signed)
°*  STAT* If patient is at the pharmacy, call can be transferred to refill team.   1. Which medications need to be refilled? (please list name of each medication and dose if known) furosemide 40 mg  2. Which pharmacy/location (including street and city if local pharmacy) is medication to be sent to? [facility] [address], , [place], [phone]   3. Do they need a 30 day or 90 day supply?   Preferred Pharmacy is [facility]

## 2017-03-22 NOTE — Telephone Encounter (Signed)
Already sent 03-21-17

## 2017-03-29 ENCOUNTER — Other Ambulatory Visit: Payer: Self-pay | Admitting: Internal Medicine

## 2017-03-29 ENCOUNTER — Telehealth: Payer: Self-pay | Admitting: *Deleted

## 2017-03-29 DIAGNOSIS — M545 Low back pain, unspecified: Secondary | ICD-10-CM

## 2017-03-29 DIAGNOSIS — Z79899 Other long term (current) drug therapy: Secondary | ICD-10-CM

## 2017-03-29 DIAGNOSIS — R7989 Other specified abnormal findings of blood chemistry: Secondary | ICD-10-CM

## 2017-03-29 DIAGNOSIS — R799 Abnormal finding of blood chemistry, unspecified: Secondary | ICD-10-CM | POA: Insufficient documentation

## 2017-03-29 DIAGNOSIS — R937 Abnormal findings on diagnostic imaging of other parts of musculoskeletal system: Secondary | ICD-10-CM

## 2017-03-29 MED ORDER — FUROSEMIDE 40 MG PO TABS: 20.0000 mg | ORAL_TABLET | ORAL | 0 refills | Status: DC

## 2017-03-29 NOTE — Telephone Encounter (Signed)
called niece Bonita Quin . She states patient is at [facility]. POHEN NUMBER OBTAINED.   RN CALLED SPOKE TO [facility] RN - nurse who is taking care of the patient. Verbal order given -- stop lasix 20 mg for 2 days ,then restart 20 mg every other day. Recheck BMP ON SEPT 4,2018 FAX RESULTS ( PHONE AND FAX NUMBER GIVEN) AS WELL AS SEND COPY WITH PATIENT TO OFFICE APPOINTMENT ON SEPT 6 ,2018 WITH MENG PA.   [facility] RN VERBALIZED Tyson Dense

## 2017-03-29 NOTE — Telephone Encounter (Signed)
-----   Message from Marykay Lex, MD sent at 03/29/2017 11:54 AM EDT ----- Current labs now show BUN 79.6 and creatinine 1.67. Otherwise labs are stable. At this point I think we probably need to have her Hold Lasix for 2 days, then reduce Lasix to 20 mg every other day and reassess BMP prior to follow-up with Azalee Course, PA.  Bryan Lemma, MD

## 2017-04-05 ENCOUNTER — Ambulatory Visit (INDEPENDENT_AMBULATORY_CARE_PROVIDER_SITE_OTHER): Payer: Medicare Other | Admitting: Physician Assistant

## 2017-04-05 DIAGNOSIS — M25561 Pain in right knee: Secondary | ICD-10-CM

## 2017-04-05 DIAGNOSIS — G8929 Other chronic pain: Secondary | ICD-10-CM

## 2017-04-05 NOTE — Progress Notes (Signed)
Mrs. [name] [name] comes in today for follow-up of her right knee. She states injection did not help her. She states she had a fall and since the fall her knee has felt better.She is in fact having some radicular symptoms down the size of both hips and some low back pain since the fall and is worked up by a [facility] at [facility] and has an MRI scheduled for tomorrow. She's having no pain in the knee today. Therefore since that the knee is overall doing well with not inject the knee today. Did not charge her for office visit today. She'll follow up on an as needed

## 2017-04-06 ENCOUNTER — Ambulatory Visit
Admission: RE | Admit: 2017-04-06 | Discharge: 2017-04-06 | Disposition: A | Discharge status: Home / Self Care | Payer: Medicare Other | Source: Ambulatory Visit | Attending: Internal Medicine | Admitting: Internal Medicine

## 2017-04-06 DIAGNOSIS — M545 Low back pain, unspecified: Secondary | ICD-10-CM

## 2017-04-06 DIAGNOSIS — R937 Abnormal findings on diagnostic imaging of other parts of musculoskeletal system: Secondary | ICD-10-CM

## 2017-04-12 ENCOUNTER — Ambulatory Visit (INDEPENDENT_AMBULATORY_CARE_PROVIDER_SITE_OTHER): Payer: Medicare Other | Admitting: Physician Assistant

## 2017-04-12 VITALS — BP 130/70 | HR 65 | Ht 64.0 in | Wt 126.0 lb

## 2017-04-12 DIAGNOSIS — Z79899 Other long term (current) drug therapy: Secondary | ICD-10-CM

## 2017-04-12 DIAGNOSIS — E785 Hyperlipidemia, unspecified: Secondary | ICD-10-CM | POA: Diagnosis not present

## 2017-04-12 DIAGNOSIS — I2510 Atherosclerotic heart disease of native coronary artery without angina pectoris: Secondary | ICD-10-CM

## 2017-04-12 DIAGNOSIS — I10 Essential (primary) hypertension: Secondary | ICD-10-CM | POA: Diagnosis not present

## 2017-04-12 DIAGNOSIS — I5022 Chronic systolic (congestive) heart failure: Secondary | ICD-10-CM | POA: Diagnosis not present

## 2017-04-12 MED ORDER — LOSARTAN POTASSIUM 25 MG PO TABS: 25.0000 mg | ORAL_TABLET | Freq: Every day | ORAL | 5 refills | Status: DC

## 2017-04-12 NOTE — Progress Notes (Signed)
Cardiology Office Note    Date:  04/14/2017   ID:  [name] [name], DOB Jan 03, 1928, MRN [id number]  PCP:  Jacques Navy, MD  Cardiologist:  Dr. Herbie Baltimore   Chief Complaint  Patient presents with  . Follow-up    swelling in legs    History of Present Illness:  [name] [name] is a 83 y.o. female with PMH of HTN, HLD, neuropathy, sciatica and CAD. She had remote stenting in 2002, Myoview in 2009 showed EF 53%, no obvious ischemia. She was last seen in 2013, however lost to follow-up since. Recently, She presented to the hospital with worsening shortness of breath, baseline orthopnea and hypoxia in the 70s. Echocardiogram revealed a new systolic heart failure with reduced EF down to 35%. Previously was normal in 2017. Patient also developed a left facial droop, left upper extremity drift, and dysarthria, code stroke was initiated. CT of the head was negative, symptom resolved. MRI of the brain obtained on 03/09/2017 was negative for acute etiology, does show chronic microvascular ischemic changes. Cardiology service was consulted and the patient was diuresed with IV Lasix. Given lack of chest discomfort, inpatient ischemic workup was deferred. She was also not interested in invasive study even if the stress test showed abnormality. She was initially placed on 80 mg twice a day of Lasix, however due to her worsening renal function, both lisinopril and Lasix were held prior to discharge.   I last saw the patient on 03/20/2017, she was residing in [facility]. Based on outside note on 03/13/2017, the plan is to resume ACE inhibitor and a diuretic once her renal function improved. I restarted Lasix 40 mg daily. This dose was later reduced  every other day due to worsening renal function. Based on the most recent basic metabolic panel, her renal function is improved. I will continue on the current dose of Lasix. On physical exam today, she does not have any significant lower  extremity edema. Her breathing is stable. She denies any significant orthopnea or PND episodes. She is accompanied by her son. I did add losartan 25 mg daily given her LV dysfunction. She will need a basic metabolic panel in one week.   Past Medical History:  Diagnosis Date  . Arthritis   . CAD (coronary artery disease)    a. remote MI/stenting in [place] in 2002. b. Nuc 2009: prior inferior MI but no evidence of ischemia, EF 53% with akinesis of inferior wall.  [name] Hearing loss    a. bilat hearing aids.  . Hip fracture (HCC)    a. s/p prior ORIF.  [name] HTN (hypertension)   . Hyperlipidemia   . MI (myocardial infarction) (HCC) 2002  . Neuropathy   . Sciatica     Past Surgical History:  Procedure Laterality Date  . ABDOMINAL HYSTERECTOMY    . CATARACT EXTRACTION  1998   RIGHT AND LEFT EYE  . CHOLECYSTECTOMY  2000  . COMPRESSION HIP SCREW  12/16/2011   Procedure: COMPRESSION HIP;  Surgeon: Kerrin Champagne, MD;  Location: [facility];  Service: Orthopedics;  Laterality: Right;  . DILATION AND CURETTAGE OF UTERUS  1960  . HYSTRECTOMY    . PTCA  2002  . REFRACTIVE SURGERY  2002    Current Medications: Outpatient Medications Prior to Visit  Medication Sig Dispense Refill  . acetaminophen (TYLENOL) 500 MG tablet Take 1,000 mg by mouth every 6 (six) hours as needed for mild pain.    [name] aspirin EC 81 MG tablet  Take 81 mg by mouth daily.    [name] atorvastatin (LIPITOR) 20 MG tablet TAKE 1 TABLET DAILY 90 tablet 1  . clopidogrel (PLAVIX) 75 MG tablet Take 75 mg by mouth daily.    . diphenoxylate-atropine (LOMOTIL) 2.5-0.025 MG tablet Take 1 tablet by mouth 4 (four) times daily as needed for diarrhea or loose stools.    . furosemide (LASIX) 40 MG tablet Take 0.5 tablets (20 mg total) by mouth every other day. (Patient taking differently: Take 20 mg by mouth 2 days. ) 90 tablet 0  . gabapentin (NEURONTIN) 100 MG capsule TAKE 1 CAPSULE TWICE A DAY (Patient taking differently: TAKE  BY MOUTH TWICE DAILY)  180 capsule 2  . metoprolol succinate (TOPROL-XL) 25 MG 24 hr tablet Take 1 tablet (25 mg total) by mouth daily. Take with or immediately following a meal. 30 tablet 1  . mirtazapine (REMERON SOL-TAB) 15 MG disintegrating tablet Take 0.5 tablets (7.5 mg total) by mouth at bedtime. 30 tablet 0  . pantoprazole (PROTONIX) 40 MG tablet Take 1 tablet (40 mg total) by mouth daily. 30 tablet 3   No facility-administered medications prior to visit.      Allergies:   Penicillins   Social History   Social History  . Marital status: Single    Spouse name: N/A  . Number of children: N/A  . Years of education: N/A   Social History Main Topics  . Smoking status: Never Smoker  . Smokeless tobacco: Never Used  . Alcohol use No  . Drug use: No  . Sexual activity: No   Other Topics Concern  . Not on file   Social History Narrative  . No narrative on file     Family History:  The patient's family history includes Aneurysm (age of onset: 29) in her brother; Cancer (age of onset: 24) in her sister; Hypertension in her father; Pneumonia (age of onset: 41) in her mother.   ROS:   Please see the history of present illness.    ROS All other systems reviewed and are negative.   PHYSICAL EXAM:   VS:  BP 130/70   Pulse 65   Ht  (1.626 m)   Wt 126 lb (57.2 kg)   LMP  (LMP Unknown)   BMI 21.63 kg/m    GEN: Well nourished, well developed, in no acute distress  HEENT: normal  Neck: no JVD, carotid bruits, or masses Cardiac: RRR; no murmurs, rubs, or gallops,no edema  Respiratory:  clear to auscultation bilaterally, normal work of breathing GI: soft, nontender, nondistended, + BS MS: no deformity or atrophy  Skin: warm and dry, no rash Neuro:  Alert and Oriented x 3, Strength and sensation are intact Psych: euthymic mood, full affect  Wt Readings from Last 3 Encounters:  04/12/17 126 lb (57.2 kg)  03/20/17 125 lb (56.7 kg)  03/12/17 120 lb 5.9 oz (54.6 kg)      Studies/Labs  Reviewed:   EKG:  EKG is not ordered today.    Recent Labs: 03/07/2017: ALT 14; B Natriuretic Peptide 703.6 03/10/2017: Hemoglobin 13.3; Platelets 299 03/11/2017: Magnesium 2.1 03/12/2017: BUN 79; Creatinine, Ser 1.78; Potassium 4.6; Sodium 138   Lipid Panel    Component Value Date/Time   CHOL 133 02/18/2015 1141   TRIG 84.0 02/18/2015 1141   HDL 53.30 02/18/2015 1141   CHOLHDL 2 02/18/2015 1141   VLDL 16.8 02/18/2015 1141   LDLCALC 63 02/18/2015 1141    Additional studies/ records that were reviewed  today include:   Echo 09/23/2015 LV EF: 55% - 60%  Study Conclusions  - Left ventricle: The cavity size was normal. There was mild concentric hypertrophy. Systolic function was normal. The estimated ejection fraction was in the range of 55% to 60%. Wall motion was normal; there were no regional wall motion abnormalities. Features are consistent with a pseudonormal left ventricular filling pattern, with concomitant abnormal relaxation and increased filling pressure (grade 2 diastolic dysfunction). Doppler parameters are consistent with high ventricular filling pressure. - Aortic valve: Moderate thickening. Mild diffuse calcification. There was mild regurgitation. - Mitral valve: There was mild regurgitation. - Left atrium: The atrium was moderately dilated. - Right ventricle: Systolic function was moderately to severely reduced. - Pulmonic valve: There was moderate to severe regurgitation. - Pulmonary arteries: PA peak pressure: 70 mm Hg (S).  Impressions:  - The right ventricular systolic pressure was increased consistent with severe pulmonary hypertension.    Echo 03/07/2017 LV EF: 35%  Study Conclusions  - Left ventricle: The cavity size was normal. Wall thickness was increased in a pattern of mild LVH. Basal to mid inferior akinesis. Inferolateral akinesis. Basal to mid anterolateral akinesis. The estimated ejection fraction was  35%. Doppler parameters are consistent with abnormal left ventricular relaxation (grade 1 diastolic dysfunction). - Aortic valve: Trileaflet; moderately calcified leaflets. Sclerosis without stenosis. There was mild regurgitation. - Mitral valve: There was mild regurgitation. - Left atrium: The atrium was mildly to moderately dilated. - Right ventricle: The cavity size was moderately dilated. Systolic function was mildly to moderately reduced. - Right atrium: The atrium was mildly dilated. - Tricuspid valve: Peak RV-RA gradient (S): 62 mm Hg. - Pulmonic valve: There was moderate to severe regurgitation. - Systemic veins: IVC not visualized.  Impressions:  - Normal LV size with mild LV hypertrophy. EF 35% with wall motion abnormalities as noted above. Aortic sclerosis without significant stenosis. Moderately dilated RV with mild to moderately decreased systolic function. Moderate to severe pulmonic insufficiency (this was noted on prior echo as well). Moderate to severe pulmonary hypertension.    MRI of brain 03/09/2017 IMPRESSION: 1. No acute intracranial abnormality or abnormal enhancement of the brain. 2. Moderate for age chronic microvascular ischemic changes and moderate parenchymal volume loss of the brain. Right thalamus small chronic lacunar infarction. 3. Bilateral mastoid air cell effusions.   ASSESSMENT:    1. Chronic systolic heart failure (HCC)   2. Encounter for long-term (current) use of medications   3. Essential hypertension   4. Hyperlipidemia, unspecified hyperlipidemia type   5. Coronary artery disease involving native coronary artery of native heart without angina pectoris      PLAN:  In order of problems listed above:  1. Chronic systolic heart failure: Currently on Lasix 20 mg every other day, euvolemic on physical exam. I will add losartan 25 mg daily to her medical regimen. Plan to up titrate the medication during the next  office visit. She will need a basic metabolic panel in one week.  2. CAD: Denies any obvious chest pain despite recent decrease in ejection fraction  3. Hypertension: Blood pressure 130/70, add losartan 25 mg daily  4. Hyperlipidemia: Continue Lipitor    Medication Adjustments/Labs and Tests Ordered: Current medicines are reviewed at length with the patient today.  Concerns regarding medicines are outlined above.  Medication changes, Labs and Tests ordered today are listed in the Patient Instructions below. Patient Instructions  Medication Instructions:   ADD Losartan  DAILY. Continue to monitor your blood  pressure weekly.  No other changes to medications.  Labwork:   BMET in 7-10 days (non-fasting test). [facility]  Testing/Procedures:  none  Follow-Up:  With Dr. Herbie BaltimoreHarding on November 14th at 10:40am as scheduled.  If you need a refill on your cardiac medications before your next appointment, please call your pharmacy.      Ramond DialSigned, Peta Peachey, [place]  04/14/2017 10:17 AM    [facility] [address], [place], [place]  [address] Phone: [phone]; Fax: [phone]

## 2017-04-12 NOTE — Patient Instructions (Addendum)
Medication Instructions:   ADD Losartan 25mg  DAILY. Continue to monitor your blood pressure weekly.  No other changes to medications.  Labwork:   BMET in 7-10 days (non-fasting test). [facility]  Testing/Procedures:  none  Follow-Up:  With Dr. Herbie BaltimoreHarding on November 14th at 10:40am as scheduled.  If you need a refill on your cardiac medications before your next appointment, please call your pharmacy.

## 2017-04-14 ENCOUNTER — Encounter: Payer: Self-pay | Admitting: Physician Assistant

## 2017-04-18 ENCOUNTER — Ambulatory Visit
Admission: RE | Admit: 2017-04-18 | Discharge: 2017-04-18 | Disposition: A | Discharge status: Home / Self Care | Payer: Medicare Other | Source: Ambulatory Visit | Attending: Internal Medicine | Admitting: Internal Medicine

## 2017-06-20 ENCOUNTER — Ambulatory Visit: Payer: Medicare Other | Admitting: Cardiology

## 2017-06-20 ENCOUNTER — Encounter: Payer: Self-pay | Admitting: Cardiology

## 2017-06-20 VITALS — BP 178/82 | HR 60 | Ht 64.0 in | Wt 140.0 lb

## 2017-06-20 DIAGNOSIS — I272 Pulmonary hypertension, unspecified: Secondary | ICD-10-CM

## 2017-06-20 DIAGNOSIS — Z9861 Coronary angioplasty status: Secondary | ICD-10-CM

## 2017-06-20 DIAGNOSIS — I2510 Atherosclerotic heart disease of native coronary artery without angina pectoris: Secondary | ICD-10-CM | POA: Diagnosis not present

## 2017-06-20 DIAGNOSIS — I5042 Chronic combined systolic (congestive) and diastolic (congestive) heart failure: Secondary | ICD-10-CM

## 2017-06-20 DIAGNOSIS — I10 Essential (primary) hypertension: Secondary | ICD-10-CM

## 2017-06-20 DIAGNOSIS — E871 Hypo-osmolality and hyponatremia: Secondary | ICD-10-CM

## 2017-06-20 DIAGNOSIS — Z79899 Other long term (current) drug therapy: Secondary | ICD-10-CM

## 2017-06-20 LAB — COMPREHENSIVE METABOLIC PANEL
A/G RATIO: 1.8 (ref 1.2–2.2)
ALBUMIN: 3.9 g/dL (ref 3.5–4.7)
ALT: 7 IU/L (ref 0–32)
AST: 14 IU/L (ref 0–40)
Alkaline Phosphatase: 104 IU/L (ref 39–117)
BILIRUBIN TOTAL: 0.3 mg/dL (ref 0.0–1.2)
BUN / CREAT RATIO: 21 (ref 12–28)
BUN: 18 mg/dL (ref 8–27)
CHLORIDE: 107 mmol/L — AB (ref 96–106)
CO2: 25 mmol/L (ref 20–29)
Calcium: 9.6 mg/dL (ref 8.7–10.3)
Creatinine, Ser: 0.86 mg/dL (ref 0.57–1.00)
GFR calc non Af Amer: 61 mL/min/{1.73_m2} (ref >59)
GFR, EST AFRICAN AMERICAN: 70 mL/min/{1.73_m2} (ref >59)
Globulin, Total: 2.2 g/dL (ref 1.5–4.5)
Glucose: 77 mg/dL (ref 65–99)
POTASSIUM: 3.7 mmol/L (ref 3.5–5.2)
Sodium: 145 mmol/L — ABNORMAL HIGH (ref 134–144)
TOTAL PROTEIN: 6.1 g/dL (ref 6.0–8.5)

## 2017-06-20 NOTE — Patient Instructions (Addendum)
NO CHANGES  WITH MEDICATIONS  EXCEPT BELOW CONTINUE LASIX EVERY OTHER DAY   -- PRN  ADDITIONAL OF LASIX 20 MG  FOR DYSPNEA  AND /OR WEIGHT INCREASE OF 3 LBS OR GREATER OVER 140 LBS, AND/OR SWELLING  , OR IF PATIENT REQUEST FOR HER QUALITY OF LIFE.  LAB -CMP TODAY - IT WILL BE DONE HERE AT THE OFFICE.   Your physician wants you to follow-up in 4 MONTHS WITH DR HARDING.You will receive a reminder letter in the mail two months in advance. If you don't receive a letter, please call our office to schedule the follow-up appointment.    If you need a refill on your cardiac medications before your next appointment, please call your pharmacy.

## 2017-06-20 NOTE — Progress Notes (Signed)
PCP: Jacques NavyNorins, Michael E, MD  Clinic Note: No chief complaint on file.   HPI: [name] [name] is a 83 y.o. female with a PMH below who presents today for 2 month f/u for CAD-cardiomyopathy.. Apparently she has a remote history of MI and PCI back in 2002, but a nonischemic nuclear stress test in 2009 along with a relatively normal EF in 2017.  However she was admitted in early August 2018 noted to be in acute combined systolic diastolic heart failure.  Her echo showed regional wall motion of normality with EF reduced to 35%.  [name] MoriHelen [name] was last seen on September 6 by Azalee CourseHao Meng, [place].  He noted that her Lasix dose had been reduced to 20 mg daily due to worsening renal function.  Her blood pressure was elevated and so therefore she did restart losartan given the LV dysfunction.  Recent Hospitalizations:   Aug 2018: Admitted for worsening shortness of breath from baseline orthopnea.  She was hypoxic in the 70s.  She had worsening edema and weight gain that was following up after a fall.  She also had a brief episode of left-sided facial droop dysarthria and left upper extremity drift.  Code stroke was called and then rescinded.  Studies Personally Reviewed - (if available, images/films reviewed: From Epic Chart or Care Everywhere)  Myoview in 2009 showed EF 53%, no obvious ischemia  2D echo February 2017: Normal EF 55-60%.  GR to DD.  Aortic sclerosis.  Moderate pulmonary hypertension with PA pressure estimated 70 million mercury.  Moderate to severely reduced RV pressure.  2D echocardiogram August 2018: EF 35%.  GR 1 DD.  Inferior akinesis as well as inferolateral akinesis.  Basal-mid anterolateral akinesis.  Aortic sclerosis without stenosis.  Moderate LA dilation.  Moderate RV dilation with mild-mildly reduced function -estimated PA P 62 mmHg.  Mild-severe PI (noted on prior echo)  Interval History: [name] JacobsonHelen is a very pleasant elderly woman who returns here today with her son.  She is  essentially wheelchair bound - basically too weak to stand & walk. She does PT exercises 3-5 d / week - hand bike, upper body exercises.  No chest pain or shortness of breath with rest or exertion.  She has has not had this level of dyspnea and edema that sent her to the hospital.   Very rarely has orthopnea or PND, edema seems to be relatively controlled.  But she seems to be very concerned about whether or not she can take her Lasix.  A monitored bed, she does not like how much it makes her urinate, but in other respects she wants to have the ability to take it.  She has had 1 or 2 episodes where she has had some swelling or sudden onset shortness of breath.  At least one occasion she was placed on oxygen, but that resolved spontaneously. No further TIA or amaurosis fugax symptoms. No rapid irregular heartbeats or palpitations.  No syncope/near syncope.  Occasionally will have spells of dizziness when she first wakes up - usually related to rolling over or sitting up fast.   [facility]. Based on previous discussions, she and her family not interested in ischemic evaluations or invasive procedures.  She is DNR/DNI.  ROS: A comprehensive was performed. ROS   I have reviewed and (if needed) personally updated the patient's problem list, medications, allergies, past medical and surgical history, social and family history.   Past Medical History:  Diagnosis  Date  . Arthritis   . CAD (coronary artery disease)    a. remote MI/stenting in [place] in 2002. b. Nuc 2009: prior inferior MI but no evidence of ischemia, EF 53% with akinesis of inferior wall.  [name] Hearing loss    a. bilat hearing aids.  . Hip fracture (HCC)    a. s/p prior ORIF.  [name] HTN (hypertension)   . Hyperlipidemia   . MI (myocardial infarction) (HCC) 2002  . Neuropathy   . Sciatica     Past Surgical History:  Procedure Laterality Date  . ABDOMINAL HYSTERECTOMY    .  CATARACT EXTRACTION  1998   RIGHT AND LEFT EYE  . CHOLECYSTECTOMY  2000  . COMPRESSION HIP SCREW  12/16/2011   Procedure: COMPRESSION HIP;  Surgeon: Kerrin Champagne, MD;  Location: [facility];  Service: Orthopedics;  Laterality: Right;  . DILATION AND CURETTAGE OF UTERUS  1960  . HYSTRECTOMY    . PTCA  2002  . REFRACTIVE SURGERY  2002    Current Meds  Medication Sig  . acetaminophen (TYLENOL) 500 MG tablet Take 1,000 mg by mouth every 6 (six) hours as needed for mild pain.  [name] aspirin EC 81 MG tablet Take 81 mg by mouth daily.  . furosemide (LASIX) 40 MG tablet Take 0.5 tablets (20 mg total) by mouth every other day. (Patient taking differently: Take 20 mg by mouth 2 days. )  . gabapentin (NEURONTIN) 100 MG capsule TAKE 1 CAPSULE TWICE A DAY (Patient taking differently: TAKE 100MG  BY MOUTH TWICE DAILY)  . losartan (COZAAR) 25 MG tablet Take 1 tablet (25 mg total) by mouth daily.  . metoprolol succinate (TOPROL-XL) 25 MG 24 hr tablet Take 1 tablet (25 mg total) by mouth daily. Take with or immediately following a meal.  . mirtazapine (REMERON SOL-TAB) 15 MG disintegrating tablet Take 0.5 tablets (7.5 mg total) by mouth at bedtime.  . pantoprazole (PROTONIX) 40 MG tablet Take 1 tablet (40 mg total) by mouth daily.    Allergies  Allergen Reactions  . Penicillins Swelling    Swelling of hands and face Has patient had a PCN reaction causing immediate rash, facial/tongue/throat swelling, SOB or lightheadedness with hypotension: unknown Has patient had a PCN reaction causing severe rash involving mucus membranes or skin necrosis: no Has patient had a PCN reaction that required hospitalization: unknown Has patient had a PCN reaction occurring within the last 10 years: No If all of the above answers are "NO", then may proceed with Cephalosporin use.     Social History   Socioeconomic History  . Marital status: Single    Spouse name: None  . Number of children: None  . Years of education: None   . Highest education level: None  Social Needs  . Financial resource strain: None  . Food insecurity - worry: None  . Food insecurity - inability: None  . Transportation needs - medical: None  . Transportation needs - non-medical: None  Occupational History  . None  Tobacco Use  . Smoking status: Never Smoker  . Smokeless tobacco: Never Used  Substance and Sexual Activity  . Alcohol use: No  . Drug use: No  . Sexual activity: No  Other Topics Concern  . None  Social History Narrative  . None    family history includes Aneurysm (age of onset: 10) in her brother; Cancer (age of onset: 27) in her sister; Hypertension in her father; Pneumonia (age of onset: 76) in her mother.  Wt  Readings from Last 3 Encounters:  06/20/17 140 lb (63.5 kg)  04/12/17 126 lb (57.2 kg)  03/20/17 125 lb (56.7 kg)    PHYSICAL EXAM BP (!) 178/82   Pulse 60   Ht 5\' 4"  (1.626 m)   Wt 140 lb (63.5 kg)   LMP  (LMP Unknown)   BMI 24.03 kg/m  Physical Exam  Constitutional: She is oriented to person, place, and time. She appears well-developed and well-nourished.  Very pleasant elderly woman who is in a wheelchair this somewhat limited mobility.  Has significant kyphoscoliosis.  HENT:  Head: Normocephalic and atraumatic.  Mouth/Throat: No oropharyngeal exudate.  Eyes: EOM are normal.  Neck: No JVD present. Carotid bruit is not present.  Cardiovascular: Normal rate and regular rhythm.  Occasional extrasystoles are present. PMI is not displaced. Exam reveals no gallop.  Murmur heard.  Harsh crescendo-decrescendo early systolic murmur is present with a grade of 2/6 at the upper right sternal border. Pulmonary/Chest: Effort normal and breath sounds normal. No respiratory distress. She has no wheezes. She has no rales.  Mildly diminished breath sounds in bases.  Abdominal: Soft. Bowel sounds are normal. She exhibits no distension. There is no tenderness. There is no rebound.  Musculoskeletal: Normal  range of motion. She exhibits no edema (Trivial).  Lymphadenopathy:    She has no cervical adenopathy.  Neurological: She is alert and oriented to person, place, and time.  Skin: Skin is warm and dry. No rash noted. No erythema.  Psychiatric: She has a normal mood and affect. Her behavior is normal. Judgment and thought content normal.     Adult ECG Report No EKG  Other studies Reviewed: Additional studies/ records that were reviewed today include:  Recent Labs:  Checked today - after addition of Losartan & with Rx of Lasix. Lab Results  Component Value Date   CREATININE 0.86 06/20/2017   BUN 18 06/20/2017   NA 145 (H) 06/20/2017   K 3.7 06/20/2017   CL 107 (H) 06/20/2017   CO2 25 06/20/2017   Lab Results  Component Value Date   CHOL 133 02/18/2015   HDL 53.30 02/18/2015   LDLCALC 63 02/18/2015   TRIG 84.0 02/18/2015   CHOLHDL 2 02/18/2015    ASSESSMENT / PLAN: Problem List Items Addressed This Visit    CAD - MI/PCI in 2002 (Chronic)    Distant history of MI.  Now new regional wall motion normality on echocardiogram.  We had a long talk about any potential evaluation and she would prefer not to pursue ischemic evaluation which would then lead to potential invasive procedures. She is medically that she is DNR/DNI.  Main focus is to continue care for quality of life.      Chronic combined systolic and diastolic CHF, NYHA class 3 (HCC) - Primary (Chronic)    She seems to be relatively euvolemic today taking low-dose of Lasix every day. She is quite hypertensive so I will increase her losartan.  Continue current dose of Toprol with resting heart rate of 60 bpm. Very difficult to do sliding scale Lasix in a long-term care facility, however we did discuss that she very much wants the ability to have Lasix available to take if she has acute dyspnea. Recommendations will therefore be to give an additional 20 mg of Lasix for acute dyspnea, increased edema or increased weightbearing  gait greater than 3 pounds.  This would continue until her weight is back to baseline where she is no longer edematous/dyspneic.  The main  focus of this is increased quality of life.  As I am adjusting her diuretic dose and ARB, will check chemistry panel today.      Relevant Orders   Comprehensive metabolic panel (Completed)   Essential hypertension (Chronic)    Poorly controlled today.  Increase losartan to 50 mg daily.      Hyponatremia    Has had a history of hyponatremia along with hypokalemia.  We will check chemistry panel today      Medication management   Relevant Orders   Comprehensive metabolic panel (Completed)   Pulmonary hypertension, moderate to severe (HCC) (Chronic)    Probably most likely secondary to left ventricular failure, but also could be related to her kyphoscoliosis. She is currently on a standard dose of Lasix and we are treating her left heart failure with ARB and beta-blocker.      Relevant Orders   Comprehensive metabolic panel (Completed)      Current medicines are reviewed at length with the patient today. (+/- concerns) concerned about having Lasix available The following changes have been made: see instructions below.  Patient Instructions  NO CHANGES  WITH MEDICATIONS  EXCEPT BELOW CONTINUE LASIX EVERY OTHER DAY   -- PRN  ADDITIONAL OF LASIX 20 MG  FOR DYSPNEA  AND /OR WEIGHT INCREASE OF 3 LBS OR GREATER OVER 140 LBS, AND/OR SWELLING  , OR IF PATIENT REQUEST FOR HER QUALITY OF LIFE.  LAB -CMP TODAY - IT WILL BE DONE HERE AT THE OFFICE.   Your physician wants you to follow-up in 4 MONTHS WITH DR Hanh Kertesz.You will receive a reminder letter in the mail two months in advance. If you don't receive a letter, please call our office to schedule the follow-up appointment.    If you need a refill on your cardiac medications before your next appointment, please call your pharmacy.    Studies Ordered:   Orders Placed This Encounter    Procedures  . Comprehensive metabolic panel      Bryan Lemmaavid Tiffay Pinette, M.D., M.S. Interventional Cardiologist   Pager # [phone] Phone # [phone] [address] [place], [place] [address]

## 2017-06-26 ENCOUNTER — Telehealth: Payer: Self-pay | Admitting: *Deleted

## 2017-06-26 NOTE — Telephone Encounter (Signed)
-----   Message from Marykay Lexavid W Harding, MD sent at 06/24/2017 11:58 AM EST ----- Glucose looks normal.  Kidney function looks back to baseline. Other labs look relatively normal.  Sodium is just a bit high as is chloride -but within lab error.  Overall good news  Bryan Lemmaavid Harding, MD

## 2017-06-26 NOTE — Telephone Encounter (Signed)
SPOKE NIECE LAB RESULTS GIVEN. PATIENT IS A RESIDENT OF [facility]       WILL FAX.  CALLED INFORMATION TO FACILITY WAS OBTAINED FROM  NICOLE (NURSE)  NURSE STATION # [phone]    PATIENT IS IN ROOM 502 FAX NUMBER # [phone]

## 2017-06-28 NOTE — Assessment & Plan Note (Signed)
She seems to be relatively euvolemic today taking low-dose of Lasix every day. She is quite hypertensive so I will increase her losartan.  Continue current dose of Toprol with resting heart rate of 60 bpm. Very difficult to do sliding scale Lasix in a long-term care facility, however we did discuss that she very much wants the ability to have Lasix available to take if she has acute dyspnea. Recommendations will therefore be to give an additional 20 mg of Lasix for acute dyspnea, increased edema or increased weightbearing gait greater than 3 pounds.  This would continue until her weight is back to baseline where she is no longer edematous/dyspneic.  The main focus of this is increased quality of life.  As I am adjusting her diuretic dose and ARB, will check chemistry panel today.

## 2017-06-28 NOTE — Assessment & Plan Note (Signed)
Has had a history of hyponatremia along with hypokalemia.  We will check chemistry panel today

## 2017-06-28 NOTE — Assessment & Plan Note (Signed)
Distant history of MI.  Now new regional wall motion normality on echocardiogram.  We had a long talk about any potential evaluation and she would prefer not to pursue ischemic evaluation which would then lead to potential invasive procedures. She is medically that she is DNR/DNI.  Main focus is to continue care for quality of life.

## 2017-06-28 NOTE — Assessment & Plan Note (Signed)
Poorly controlled today.  Increase losartan to 50 mg daily.

## 2017-06-28 NOTE — Assessment & Plan Note (Signed)
Probably most likely secondary to left ventricular failure, but also could be related to her kyphoscoliosis. She is currently on a standard dose of Lasix and we are treating her left heart failure with ARB and beta-blocker.

## 2017-07-02 ENCOUNTER — Encounter (INDEPENDENT_AMBULATORY_CARE_PROVIDER_SITE_OTHER): Payer: Self-pay | Admitting: Physician Assistant

## 2017-07-02 ENCOUNTER — Ambulatory Visit (INDEPENDENT_AMBULATORY_CARE_PROVIDER_SITE_OTHER): Payer: Medicare Other | Admitting: Physician Assistant

## 2017-07-02 DIAGNOSIS — M1711 Unilateral primary osteoarthritis, right knee: Secondary | ICD-10-CM

## 2017-07-02 DIAGNOSIS — M1712 Unilateral primary osteoarthritis, left knee: Secondary | ICD-10-CM

## 2017-07-02 DIAGNOSIS — M170 Bilateral primary osteoarthritis of knee: Secondary | ICD-10-CM

## 2017-07-02 MED ORDER — METHYLPREDNISOLONE ACETATE 40 MG/ML IJ SUSP
40.0000 mg | INTRAMUSCULAR | Status: AC | PRN
  Administered 2017-07-02: 40 mg via INTRA_ARTICULAR

## 2017-07-02 MED ORDER — LIDOCAINE HCL 1 % IJ SOLN
0.5000 mL | INTRAMUSCULAR | Status: AC | PRN
  Administered 2017-07-02: .5 mL

## 2017-07-02 NOTE — Progress Notes (Signed)
   Procedure Note  Patient: Ilda MoriHelen Stamant             Date of Birth: 04-29-1928           MRN: [id number]             Visit Date: 07/02/2017  HPI: Ms. Michelene GardenerSteffens swelling Dr. Magnus IvanBlackman service has osteoarthritis of both knees.  She comes in today requesting injection in both knees.  She has had no new injury.  She is having difficulty standing at times due to the right knee pain.  Physical exam bilateral knees: No instability.  She has full extension and flexion beyond 90 degrees.  No effusion of either knee.  No rashes skin lesions or ulcerations erythema or ecchymosis either knee  Procedures: Visit Diagnoses: No diagnosis found.  Large Joint Inj: bilateral knee on 07/02/2017 10:21 AM Indications: pain Details: 22 G 1.5 in needle, anterolateral approach  Arthrogram: No  Medications (Right): 0.5 mL lidocaine 1 %; 40 mg methylPREDNISolone acetate 40 MG/ML Medications (Left): 0.5 mL lidocaine 1 %; 40 mg methylPREDNISolone acetate 40 MG/ML Outcome: tolerated well, no immediate complications Procedure, treatment alternatives, risks and benefits explained, specific risks discussed. Consent was given by the patient. Immediately prior to procedure a time out was called to verify the correct patient, procedure, equipment, support staff and site/side marked as required. Patient was prepped and draped in the usual sterile fashion.     Plan: She will follow-up with us in the approximately 3 months for repeat injections in 1 of both knees.  Questions are encouraged and answered.

## 2017-10-04 ENCOUNTER — Ambulatory Visit (INDEPENDENT_AMBULATORY_CARE_PROVIDER_SITE_OTHER): Payer: Medicare Other | Admitting: Physician Assistant

## 2017-10-18 ENCOUNTER — Encounter: Payer: Self-pay | Admitting: Cardiology

## 2017-10-18 ENCOUNTER — Ambulatory Visit: Payer: Medicare Other | Admitting: Cardiology

## 2017-10-18 ENCOUNTER — Telehealth: Payer: Self-pay | Admitting: Cardiology

## 2017-10-18 VITALS — BP 179/93 | HR 57 | Ht 64.0 in | Wt 145.0 lb

## 2017-10-18 DIAGNOSIS — I2510 Atherosclerotic heart disease of native coronary artery without angina pectoris: Secondary | ICD-10-CM

## 2017-10-18 DIAGNOSIS — I272 Pulmonary hypertension, unspecified: Secondary | ICD-10-CM

## 2017-10-18 DIAGNOSIS — I5042 Chronic combined systolic (congestive) and diastolic (congestive) heart failure: Secondary | ICD-10-CM

## 2017-10-18 DIAGNOSIS — I10 Essential (primary) hypertension: Secondary | ICD-10-CM

## 2017-10-18 DIAGNOSIS — Z9861 Coronary angioplasty status: Secondary | ICD-10-CM

## 2017-10-18 DIAGNOSIS — E782 Mixed hyperlipidemia: Secondary | ICD-10-CM | POA: Diagnosis not present

## 2017-10-18 MED ORDER — FUROSEMIDE 20 MG PO TABS: ORAL_TABLET | ORAL | 6 refills | Status: DC

## 2017-10-18 MED ORDER — LOSARTAN POTASSIUM 50 MG PO TABS: 50.0000 mg | ORAL_TABLET | Freq: Every day | ORAL | 3 refills | Status: DC

## 2017-10-18 NOTE — Progress Notes (Signed)
PCP: Jacques NavyNorins, Michael E, MD  Clinic Note: Chief Complaint  Patient presents with  . Follow-up  . Congestive Heart Failure    Recently had a spell where her Lasix was increased and placed daily.  Now hoping to go back.    HPI: [name] [name] is a 84 y.o. female with a PMH below who presents today for 2 month f/u for CAD-cardiomyopathy..  Apparently she has a remote history of MI and PCI back in 2002, but a nonischemic nuclear stress test in 2009 along with a relatively normal EF in 2017.    She was admitted August 2018 noted to be in acute combined systolic diastolic heart failure --> Echo showed regional wall motion of normality with EF reduced to 35% in inferior inferolateral akinesis-> as well as anterolateral akinesis.  (Would suggest circumflex territory).  She is [facility] a [facility]. Based on previous discussions, she and her family not interested in ischemic evaluations or invasive procedures.  She is DNR/DNI. [name] MoriHelen [name] was last seen on June 20, 2017.  Partly for follow-up of hospitalization.  Had noted occasional swelling and shortness of breath.  Otherwise doing okay.  Was told to take Lasix daily.  Seen by nursing facility provider on February 4 for poor blood pressure control increased shortness of breath and swelling.  Blood pressure was 197 /108  Lasix restarted at 40 mg twice daily for 3 days and then 40 mg daily. -->  With 7 pound weight gain.  6 February for CHF exacerbation with increased diuretic dosing.  Apparently she was not eating very much and was more lethargic and sleepy.  Recommended physical therapy.  Plan was to continue Lasix at current dose as breathing was better.  Felt to have some failure to thrive symptoms (FDG).  More somnolent with decreased appetite.  (DNR/DNI).  Recent Hospitalizations:   None  Studies Personally Reviewed - (if available, images/films reviewed: From Epic Chart or Care  Everywhere)  None  Interval History: [name] [name] is a very pleasant elderly woman who returns here today with her son.  She remains wheelchair-bound very weak and somewhat lethargic.  She does work with physical therapy.  She asked if she needs to stay on Lasix every day because she just hates all the effort it takes to go to the bathroom.  She indicates that her shortness of breath and swelling is notably improved with the increased dose of Lasix.  However, now she is tired of the annoyance of having to get up to go to the bathroom all the time.  She is concerned about falling when walking to the bathroom. She really does not do much, but has been noticing exertional dyspnea that is improved.  She still tries to exercise and physical therapy 7 days a week with hand bike and upper body exercises.  In this setting is no significant dyspnea but she is currently had been a little bit dyspneic before her Lasix was increased.  She is hoping to back off.  She previous remains with almost too weak to stand and walk.  Still doing PT. We again reviewed her current condition and is clear that she would not want invasive evaluation for any symptoms.  She is wearing TED hose for her edema and the swelling has improved. Because of hypoxia on pulse ox is she is back on oxygen.  She is not actively showing signs of anginal chest pain or dyspnea with rest or exertion. Occasionally will have spells  of dizziness when she first wakes up - usually related to rolling over or sitting up fast.   No obvious PND orthopnea symptoms.  No real change, if any some reduced lower extremity edema having changed stockings.  ROS: A comprehensive was performed. Review of Systems  Constitutional: Positive for malaise/fatigue.  HENT: Negative for congestion and nosebleeds.   Cardiovascular: Negative for chest pain and palpitations.  Gastrointestinal: Positive for heartburn. Negative for abdominal pain, blood in stool and nausea.    Genitourinary: Positive for frequency (Not happy about frequency with increased dose of Lasix).  Musculoskeletal: Positive for joint pain and neck pain.       Continues to be on pretty high-dose gabapentin  Neurological: Positive for dizziness (Probably because of being wheelchair-bound and deconditioned, she does get dizzy if she tries to stand up.), sensory change (Peripheral neuropathy symptoms) and weakness (Global). Negative for seizures.  Psychiatric/Behavioral: Negative for depression (If not depressed, at least has been appearing disengaged) and memory loss. The patient is nervous/anxious. The patient does not have insomnia.   All other systems reviewed and are negative.   I have reviewed and (if needed) personally updated the patient's problem list, medications, allergies, past medical and surgical history, social and family history.   Past Medical History:  Diagnosis Date  . Arthritis   . CAD (coronary artery disease)    a. remote MI/stenting in [place] in 2002. b. Nuc 2009: prior inferior MI but no evidence of ischemia, EF 53% with akinesis of inferior wall.  [name] Hearing loss    a. bilat hearing aids.  . Hip fracture (HCC)    a. s/p prior ORIF.  [name] HTN (hypertension)   . Hyperlipidemia   . MI (myocardial infarction) (HCC) 2002  . Neuropathy   . Sciatica     Past Surgical History:  Procedure Laterality Date  . ABDOMINAL HYSTERECTOMY    . CATARACT EXTRACTION  1998   RIGHT AND LEFT EYE  . CHOLECYSTECTOMY  2000  . COMPRESSION HIP SCREW  12/16/2011   Procedure: COMPRESSION HIP;  Surgeon: Kerrin Champagne, MD;  Location: [facility];  Service: Orthopedics;  Laterality: Right;  . DILATION AND CURETTAGE OF UTERUS  1960  . HYSTRECTOMY    . NM MYOVIEW LTD  2009   EF 53%.  No ischemia or infarction.  [name] PTCA  2002  . REFRACTIVE SURGERY  2002  . TRANSTHORACIC ECHOCARDIOGRAM  09/2015    Normal EF 55-60%.  GR to DD.  Aortic sclerosis.  Moderate pulmonary hypertension with PA pressure estimated  70 million mercury.  Moderate to severely reduced RV pressure.  . TRANSTHORACIC ECHOCARDIOGRAM  03/2017   EF 35%.  GR 1 DD.  Inferior akinesis as well as inferolateral akinesis.  Basal-mid anterolateral akinesis.  Aortic sclerosis without stenosis.  Moderate LA dilation.  Moderate RV dilation with mild-mildly reduced function -estimated PA P 62 mmHg.  Mild-severe PI (noted on prior echo)    Current Meds  Medication Sig  . acetaminophen (TYLENOL) 500 MG tablet Take 1,000 mg by mouth every 6 (six) hours as needed for mild pain.  [name] aspirin EC 81 MG tablet Take 81 mg by mouth daily.  . furosemide (LASIX) 20 MG tablet TAKE 40 MG on MONDAYS, 20 MG ON WEDNESDAYS AND FRIDAYS,NONE ALL THE REST OF THE WEEK  . gabapentin (NEURONTIN) 100 MG capsule TAKE 1 CAPSULE TWICE A DAY (Patient taking differently: TAKE 100MG  BY MOUTH TWICE DAILY)  . guaiFENesin (ROBITUSSIN) 100 MG/5ML SOLN Take 5 mLs  by mouth every 4 (four) hours as needed for cough or to loosen phlegm.  . metoprolol succinate (TOPROL-XL) 25 MG 24 hr tablet Take 1 tablet (25 mg total) by mouth daily. Take with or immediately following a meal.  . pantoprazole (PROTONIX) 40 MG tablet Take 1 tablet (40 mg total) by mouth daily.  . [DISCONTINUED] furosemide (LASIX) 20 MG tablet Take 20 mg by mouth.  . [DISCONTINUED] losartan (COZAAR) 25 MG tablet Take 25 mg by mouth daily.    Allergies  Allergen Reactions  . Penicillins Swelling    Swelling of hands and face Has patient had a PCN reaction causing immediate rash, facial/tongue/throat swelling, SOB or lightheadedness with hypotension: unknown Has patient had a PCN reaction causing severe rash involving mucus membranes or skin necrosis: no Has patient had a PCN reaction that required hospitalization: unknown Has patient had a PCN reaction occurring within the last 10 years: No If all of the above answers are "NO", then may proceed with Cephalosporin use.     Social History   Socioeconomic History    . Marital status: Single    Spouse name: None  . Number of children: None  . Years of education: None  . Highest education level: None  Social Needs  . Financial resource strain: None  . Food insecurity - worry: None  . Food insecurity - inability: None  . Transportation needs - medical: None  . Transportation needs - non-medical: None  Occupational History  . None  Tobacco Use  . Smoking status: Never Smoker  . Smokeless tobacco: Never Used  Substance and Sexual Activity  . Alcohol use: No  . Drug use: No  . Sexual activity: No  Other Topics Concern  . None  Social History Narrative  . None    family history includes Aneurysm (age of onset: 94) in her brother; Cancer (age of onset: 64) in her sister; Hypertension in her father; Pneumonia (age of onset: 77) in her mother.  Wt Readings from Last 3 Encounters:  10/18/17 145 lb (65.8 kg)  06/20/17 140 lb (63.5 kg)  04/12/17 126 lb (57.2 kg)    PHYSICAL EXAM BP (!) 179/93   Pulse (!) 57   Ht 5\' 4"  (1.626 m)   Wt 145 lb (65.8 kg)   LMP  (LMP Unknown)   SpO2 96%   BMI 24.89 kg/m  Physical Exam  Constitutional: She is oriented to person, place, and time. She appears well-developed and well-nourished.  Very pleasant elderly woman who is in a wheelchair this somewhat limited mobility.  Has significant kyphoscoliosis and is  quite frail /chronically ill-appearing.  She is wearing TED hose .  HENT:  Head: Normocephalic and atraumatic.  Mouth/Throat: No oropharyngeal exudate.  Eyes: EOM are normal.  Neck: No JVD present. Carotid bruit is not present.  Cardiovascular: Normal rate and regular rhythm.  Occasional extrasystoles are present. PMI is not displaced. Exam reveals no gallop and no friction rub.  Murmur heard.  Harsh crescendo-decrescendo early systolic murmur is present with a grade of 2/6 at the upper right sternal border. Pulmonary/Chest: Effort normal and breath sounds normal. No respiratory distress. She has no  wheezes. She has no rales.  Mildly diminished breath sounds in bases.  Abdominal: Soft. Bowel sounds are normal. She exhibits no distension. There is no tenderness. There is no rebound.  Musculoskeletal: Normal range of motion. She exhibits no edema (Trivial).  Lymphadenopathy:    She has no cervical adenopathy.  Neurological: She is alert  and oriented to person, place, and time. No cranial nerve deficit.  Skin: Skin is warm and dry. No rash noted. No erythema.  Psychiatric: She has a normal mood and affect. Her behavior is normal. Judgment and thought content normal.     Adult ECG Report No EKG  Other studies Reviewed: Additional studies/ records that were reviewed today include:  Recent Labs:  Checked today - after addition of Losartan & with Rx of Lasix. Lab Results  Component Value Date   CREATININE 0.86 06/20/2017   BUN 18 06/20/2017   NA 145 (H) 06/20/2017   K 3.7 06/20/2017   CL 107 (H) 06/20/2017   CO2 25 06/20/2017   Lab Results  Component Value Date   CHOL 133 02/18/2015   HDL 53.30 02/18/2015   LDLCALC 63 02/18/2015   TRIG 84.0 02/18/2015   CHOLHDL 2 02/18/2015    ASSESSMENT / PLAN: Problem List Items Addressed This Visit    Pulmonary hypertension, moderate to severe (HCC) (Chronic)    Most likely related to pulmonary venous hypertension and kyphoscoliosis.  Best treatment option would be continue home oxygen. Ensure that she is not volume overloaded with acceptable dose of Lasix along with afterload reduction. Home oxygen      Relevant Medications   losartan (COZAAR) 50 MG tablet   furosemide (LASIX) 20 MG tablet   Hyperlipidemia    Has not had lipids checked that I can see since 2016.  Apparently they are checked out of the nursing facility.  Not currently available.  She is no longer on a statin.      Relevant Medications   losartan (COZAAR) 50 MG tablet   furosemide (LASIX) 20 MG tablet   Essential hypertension (Chronic)    Blood pressure still is  high.  She is supposed to be increase losartan to 50 mg last visit, but that did not happen.  We will go ahead with that plan. Continue TED hoseand worry about possible gun show when he returns.  Done show      Relevant Medications   losartan (COZAAR) 50 MG tablet   furosemide (LASIX) 20 MG tablet   Chronic combined systolic and diastolic CHF, NYHA class 3 (HCC) (Chronic)    To recover from her recent exacerbation.  Her son is still concerned.  Needs to be seen more frequently.  There is concern about whether or not her weights are checked.  Continue with increased dose of Lasix that was written as daily.  I think she can now take it every other day based on weights. On Toprol and losartan with elevated blood pressure.  Will increase losartan to 50 mg daily.  At this point, not able to titrate beta-blocker higher due to fatigue and bradycardia.  Titrate up losartan to 50 mg daily.  Low threshold to continue titration. For now I think she is probably okay backing off on Lasix to every other day but would go to daily if her weights climb.  She needs to be weighed daily.        Relevant Medications   losartan (COZAAR) 50 MG tablet   furosemide (LASIX) 20 MG tablet   CAD - MI/PCI in 2002 - Primary (Chronic)    Very distant history of MI with no active anginal symptoms now.  She does have wall motion normality noted on echocardiogram, but after discussion with her son and with the patient, she would not want ischemic evaluation done. Continue aspirin and beta-blocker.  Last LDL was 63 and  at goal.  DNR/DNI      Relevant Medications   losartan (COZAAR) 50 MG tablet   furosemide (LASIX) 20 MG tablet      Current medicines are reviewed at length with the patient today. (+/- concerns) concerned about having Lasix available The following changes have been made: see instructions below.  Patient Instructions  MEDICATION INSTRUCTIONS  STOP/DISCONTINUE-- LOSARTAN 25 MG  START LOSARTAN  50 MG ONE TABLET DAILY  CHANGE-- TAKE LASIX 40 MG ON MONDAYS, Wednesday 20 MG ,FRIDAY20 MG , ALL THE OTHERS DAYS DO NOT TAKE LASIX DOSE.      CONTINUE TO WEAR COMPRESSION STOCKING DURING THE DAY AND REMOVE AT NIGHT     Your physician wants you to follow-up in 6 MONTHS WITH DR Nakyiah Kuck. You will receive a reminder letter in the mail two months in advance. If you don't receive a letter, please call our office to schedule the follow-up appointment.     Studies Ordered:   No orders of the defined types were placed in this encounter.     Bryan Lemma, M.D., M.S. Interventional Cardiologist   Pager # [phone] Phone # [phone] [address] [place], [place] [address]

## 2017-10-18 NOTE — Telephone Encounter (Signed)
New Message:   Pt was here to see Dr Herbie BaltimoreHarding today.She needs clarification on her Lasix please.

## 2017-10-18 NOTE — Telephone Encounter (Signed)
Returned call to Tiffany, lasix directions discussed, no further questions.  Per 10-18-17 OV note:CHANGE-- TAKE LASIX 40 MG ON MONDAYS, Wednesday 20 MG ,FRIDAY20 MG , ALL THE OTHERS DAYS DO NOT TAKE LASIX DOSE.

## 2017-10-18 NOTE — Patient Instructions (Signed)
MEDICATION INSTRUCTIONS  STOP/DISCONTINUE-- LOSARTAN 25 MG  START LOSARTAN 50 MG ONE TABLET DAILY  CHANGE-- TAKE LASIX 40 MG ON MONDAYS, Wednesday 20 MG ,FRIDAY20 MG , ALL THE OTHERS DAYS DO NOT TAKE LASIX DOSE.      CONTINUE TO WEAR COMPRESSION STOCKING DURING THE DAY AND REMOVE AT NIGHT     Your physician wants you to follow-up in 6 MONTHS WITH DR HARDING. You will receive a reminder letter in the mail two months in advance. If you don't receive a letter, please call our office to schedule the follow-up appointment.

## 2017-10-20 ENCOUNTER — Encounter: Payer: Self-pay | Admitting: Cardiology

## 2017-10-20 NOTE — Assessment & Plan Note (Addendum)
To recover from her recent exacerbation.  Her son is still concerned.  Needs to be seen more frequently.  There is concern about whether or not her weights are checked.  Continue with increased dose of Lasix that was written as daily.  I think she can now take it every other day based on weights. On Toprol and losartan with elevated blood pressure.  Will increase losartan to 50 mg daily.  At this point, not able to titrate beta-blocker higher due to fatigue and bradycardia.  Titrate up losartan to 50 mg daily.  Low threshold to continue titration. For now I think she is probably okay backing off on Lasix to every other day but would go to daily if her weights climb.  She needs to be weighed daily.

## 2017-10-20 NOTE — Assessment & Plan Note (Signed)
Very distant history of MI with no active anginal symptoms now.  She does have wall motion normality noted on echocardiogram, but after discussion with her son and with the patient, she would not want ischemic evaluation done. Continue aspirin and beta-blocker.  Last LDL was 63 and at goal.  DNR/DNI

## 2017-10-20 NOTE — Assessment & Plan Note (Addendum)
Most likely related to pulmonary venous hypertension and kyphoscoliosis.  Best treatment option would be continue home oxygen. Ensure that she is not volume overloaded with acceptable dose of Lasix along with afterload reduction. Home oxygen

## 2017-10-20 NOTE — Assessment & Plan Note (Signed)
Blood pressure still is high.  She is supposed to be increase losartan to 50 mg last visit, but that did not happen.  We will go ahead with that plan. Continue TED hoseand worry about possible gun show when he returns.  Done show

## 2017-10-20 NOTE — Assessment & Plan Note (Signed)
Has not had lipids checked that I can see since 2016.  Apparently they are checked out of the nursing facility.  Not currently available.  She is no longer on a statin.

## 2017-12-24 ENCOUNTER — Ambulatory Visit (INDEPENDENT_AMBULATORY_CARE_PROVIDER_SITE_OTHER): Payer: Medicare Other | Admitting: Physician Assistant

## 2018-01-21 NOTE — Progress Notes (Signed)
Cardiology Office Note   Date:  01/22/2018   ID:  [name] MoriHelen [name], DOB 08-20-1927, MRN [id number]  PCP:  Jacques NavyNorins, Michael E, MD  Cardiologist:  Herbie BaltimoreHarding   Chief Complaint  Patient presents with  . Follow-up    STEMI with PCI  . Edema     History of Present Illness: [name] MoriHelen [name] is a 84 y.o. female who presents for ongoing assessment and management of coronary artery disease: History of MI with PCI in 2002, nonischemic nuclear stress test in 2009; she was admitted August 2018 with acute on chronic combined systolic diastolic heart failure, with echocardiogram revealing an EF of 35% in the inferior lateral walls with akinesis as well as anterior lateral akinesis.  She was found to have pulmonary hypertension, and is being treated with home oxygen.   Other history includes hypertension, and was last seen in the office in February 2019 to evaluate blood pressure control and was found to be hypertensive with a blood pressure 197/108.  At that time Lasix was restarted at 40 mg twice daily for 3 days and then 40 mg every other day.  It is noted that the patient is a DNR with failure to thrive symptoms.  She was last seen by Dr. Herbie BaltimoreHarding on 10/18/2017.  He noted that she is a resident of [facility].  Her losartan dose was increased to 50 mg in order to have better blood pressure control.  Orders were sent back to weigh her daily.  She was to take extra doses of Lasix for weight gain.  But in the setting of failure to thrive this may be harder to manage.  She comes today with left arm edema. She has a torn rotator cuff on the left shoulder and has not been able to move he left arm very well on he own. She is accompanied by a family member who states that the edema has worsened over the last 2 weeks. She has had an ultrasound of the axilla and breast via he SNF physician which was negative for breast mass. She did not have ultrasound of the left arm. She has significant dependent  edema and is wearing TED hose.   Past Medical History:  Diagnosis Date  . Arthritis   . CAD (coronary artery disease)    a. remote MI/stenting in [place] in 2002. b. Nuc 2009: prior inferior MI but no evidence of ischemia, EF 53% with akinesis of inferior wall.  [name]. Hearing loss    a. bilat hearing aids.  . Hip fracture (HCC)    a. s/p prior ORIF.  [name]. HTN (hypertension)   . Hyperlipidemia   . MI (myocardial infarction) (HCC) 2002  . Neuropathy   . Sciatica     Past Surgical History:  Procedure Laterality Date  . ABDOMINAL HYSTERECTOMY    . CATARACT EXTRACTION  1998   RIGHT AND LEFT EYE  . CHOLECYSTECTOMY  2000  . COMPRESSION HIP SCREW  12/16/2011   Procedure: COMPRESSION HIP;  Surgeon: Kerrin ChampagneJames E Nitka, MD;  Location: [facility];  Service: Orthopedics;  Laterality: Right;  . DILATION AND CURETTAGE OF UTERUS  1960  . HYSTRECTOMY    . NM MYOVIEW LTD  2009   EF 53%.  No ischemia or infarction.  [name]. PTCA  2002  . REFRACTIVE SURGERY  2002  . TRANSTHORACIC ECHOCARDIOGRAM  09/2015    Normal EF 55-60%.  GR to DD.  Aortic sclerosis.  Moderate pulmonary hypertension with PA pressure estimated 70 million mercury.  Moderate to severely reduced RV pressure.  . TRANSTHORACIC ECHOCARDIOGRAM  03/2017   EF 35%.  GR 1 DD.  Inferior akinesis as well as inferolateral akinesis.  Basal-mid anterolateral akinesis.  Aortic sclerosis without stenosis.  Moderate LA dilation.  Moderate RV dilation with mild-mildly reduced function -estimated PA P 62 mmHg.  Mild-severe PI (noted on prior echo)     Current Outpatient Medications  Medication Sig Dispense Refill  . acetaminophen (TYLENOL) 500 MG tablet Take 1,000 mg by mouth every 6 (six) hours as needed for mild pain.    [name] aspirin EC 81 MG tablet Take 81 mg by mouth daily.    [name] doxycycline (VIBRA-TABS) 100 MG tablet     . furosemide (LASIX) 40 MG tablet Take 1 tablet (40 mg total) by mouth daily. 30 tablet 5  . gabapentin (NEURONTIN) 100 MG capsule TAKE 1 CAPSULE TWICE  A DAY (Patient taking differently: TAKE 100MG  BY MOUTH TWICE DAILY) 180 capsule 2  . guaiFENesin (ROBITUSSIN) 100 MG/5ML SOLN Take 5 mLs by mouth every 4 (four) hours as needed for cough or to loosen phlegm.    . metoprolol succinate (TOPROL-XL) 25 MG 24 hr tablet Take 1 tablet (25 mg total) by mouth daily. Take with or immediately following a meal. 30 tablet 1  . pantoprazole (PROTONIX) 40 MG tablet Take 1 tablet (40 mg total) by mouth daily. 30 tablet 3  . losartan (COZAAR) 100 MG tablet Take 1 tablet (100 mg total) by mouth daily. 90 tablet 3   No current facility-administered medications for this visit.     Allergies:   Penicillins    Social History:  The patient  reports that she has never smoked. She has never used smokeless tobacco. She reports that she does not drink alcohol or use drugs.   Family History:  The patient's family history includes Aneurysm (age of onset: 38) in her brother; Cancer (age of onset: 73) in her sister; Hypertension in her father; Pneumonia (age of onset: 49) in her mother.    ROS: All other systems are reviewed and negative. Unless otherwise mentioned in H&P    PHYSICAL EXAM: VS:  BP (!) 184/107   Pulse (!) 58   Ht 5\' 4"  (1.626 m)   Wt 159 lb (72.1 kg)   LMP  (LMP Unknown)   BMI 27.29 kg/m  , BMI Body mass index is 27.29 kg/m. GEN: Well nourished, well developed, in no acute distress  HEENT: normal  Neck: no JVD, carotid bruits, or masses Cardiac: RRR; no murmurs, rubs, or gallops,no edema  Respiratory:  clear to auscultation bilaterally, normal work of breathing GI: soft, nontender, nondistended, + BS MS: no deformity or atrophy Distal pulse in the left wrist is  Palpable, left arm edema with coolness is noted. No cyanosis of the hand or nail beds.  Skin: warm and dry, no rash Neuro:  Strength and sensation are intact Psych: euthymic mood, full affect   EKG: Not completed this office visit.   Recent Labs: 03/07/2017: B Natriuretic Peptide  703.6 03/10/2017: Hemoglobin 13.3; Platelets 299 03/11/2017: Magnesium 2.1 06/20/2017: ALT 7; BUN 18; Creatinine, Ser 0.86; Potassium 3.7; Sodium 145    Lipid Panel    Component Value Date/Time   CHOL 133 02/18/2015 1141   TRIG 84.0 02/18/2015 1141   HDL 53.30 02/18/2015 1141   CHOLHDL 2 02/18/2015 1141   VLDL 16.8 02/18/2015 1141   LDLCALC 63 02/18/2015 1141      Wt Readings from Last 3 Encounters:  01/22/18 159 lb (72.1 kg)  10/18/17 145 lb (65.8 kg)  06/20/17 140 lb (63.5 kg)      Other studies Reviewed: Echocardiogram Mar 18, 2018 Left ventricle: The cavity size was normal. Wall thickness was   increased in a pattern of mild LVH. Basal to mid inferior   akinesis. Inferolateral akinesis. Basal to mid anterolateral   akinesis. The estimated ejection fraction was 35%. Doppler   parameters are consistent with abnormal left ventricular   relaxation (grade 1 diastolic dysfunction). - Aortic valve: Trileaflet; moderately calcified leaflets.   Sclerosis without stenosis. There was mild regurgitation. - Mitral valve: There was mild regurgitation. - Left atrium: The atrium was mildly to moderately dilated. - Right ventricle: The cavity size was moderately dilated. Systolic   function was mildly to moderately reduced. - Right atrium: The atrium was mildly dilated. - Tricuspid valve: Peak RV-RA gradient (S): 62 mm Hg. - Pulmonic valve: There was moderate to severe regurgitation. - Systemic veins: IVC not visualized.  ASSESSMENT AND PLAN:  1.  Left arm edema: Lymphedema vs DVT. I will have venous ultrasound completed of the left arm for further assessment. She is also on antibiotic therapy for possible cellulitis of the left arm. She is not having any pain, she has very little ROM of the left shoulder due to rotator cuff damage.   2. Hypertension: BP is not well controlled. I will increase losartan to 100 mg daily and continue lasix as directed. Due to reduced EF will need to consider  Entresto on next visit.   3. Combined CHF: EF of 35%. HR is controlled but BP is very elevated today. She will have lasix increased to daily instead of QOD.  Follow up BMET will be ordered Daily weights are recommended. She will need daily weights.      Current medicines are reviewed at length with the patient today.    Labs/ tests ordered today include: Venous doppler ultrasound of the left arm.   Bettey Mare. Liborio Nixon, ANP, AACC   01/22/2018 10:51 AM    [facility] [address], [place], [place] [address] Phone: [phone]; Fax: [phone]

## 2018-01-22 ENCOUNTER — Encounter: Payer: Self-pay | Admitting: Adult Health

## 2018-01-22 ENCOUNTER — Ambulatory Visit: Payer: Medicare Other | Admitting: Adult Health

## 2018-01-22 VITALS — BP 184/107 | HR 58 | Ht 64.0 in | Wt 159.0 lb

## 2018-01-22 DIAGNOSIS — I2510 Atherosclerotic heart disease of native coronary artery without angina pectoris: Secondary | ICD-10-CM | POA: Diagnosis not present

## 2018-01-22 DIAGNOSIS — I10 Essential (primary) hypertension: Secondary | ICD-10-CM

## 2018-01-22 DIAGNOSIS — M7989 Other specified soft tissue disorders: Secondary | ICD-10-CM | POA: Diagnosis not present

## 2018-01-22 DIAGNOSIS — Z9861 Coronary angioplasty status: Secondary | ICD-10-CM | POA: Diagnosis not present

## 2018-01-22 DIAGNOSIS — Z79899 Other long term (current) drug therapy: Secondary | ICD-10-CM | POA: Diagnosis not present

## 2018-01-22 MED ORDER — LOSARTAN POTASSIUM 100 MG PO TABS: 100.0000 mg | ORAL_TABLET | Freq: Every day | ORAL | 3 refills | Status: AC

## 2018-01-22 MED ORDER — FUROSEMIDE 40 MG PO TABS: 40.0000 mg | ORAL_TABLET | Freq: Every day | ORAL | 5 refills | Status: DC

## 2018-01-22 NOTE — Patient Instructions (Signed)
Medication Instructions:  INCREASE LOSARTAN 100MG  DAILY-1 TAB   TAKE LASIX DAILY-7 DAYS A WEEK  If you need a refill on your cardiac medications before your next appointment, please call your pharmacy.  Labwork: BMET IN ONE WEEK~ 01-29-18 HERE IN OUR OFFICE AT [facility]  Take the provided lab slips with you to the lab for your blood draw.   You will NOT need to fast   Testing/Procedures: LEFT ARM US SCHEDULED FOR 6-19 TOMORROW AT 8AM-HOLD LASIX UNTIL AFTER US   Follow-Up: Your physician wants you to follow-up in: 2 WEEKS WITH -OR- KATHRYN LAWRENCE (NURSE PRACTIONIER), [facility],AACC    Thank you for choosing [facility]!!

## 2018-01-23 ENCOUNTER — Ambulatory Visit (HOSPITAL_COMMUNITY)
Admission: RE | Admit: 2018-01-23 | Discharge: 2018-01-23 | Disposition: A | Discharge status: Home / Self Care | Payer: Medicare Other | Source: Ambulatory Visit | Attending: Cardiovascular Disease | Admitting: Cardiovascular Disease

## 2018-01-23 DIAGNOSIS — M7989 Other specified soft tissue disorders: Secondary | ICD-10-CM | POA: Diagnosis not present

## 2018-01-29 ENCOUNTER — Encounter: Payer: Self-pay | Admitting: Cardiology

## 2018-02-05 ENCOUNTER — Encounter: Payer: Self-pay | Admitting: Adult Health

## 2018-02-05 ENCOUNTER — Ambulatory Visit: Payer: Medicare Other | Admitting: Adult Health

## 2018-02-05 VITALS — BP 138/84 | HR 62 | Ht 64.0 in | Wt 140.0 lb

## 2018-02-05 DIAGNOSIS — I5022 Chronic systolic (congestive) heart failure: Secondary | ICD-10-CM | POA: Diagnosis not present

## 2018-02-05 DIAGNOSIS — I2510 Atherosclerotic heart disease of native coronary artery without angina pectoris: Secondary | ICD-10-CM

## 2018-02-05 DIAGNOSIS — R600 Localized edema: Secondary | ICD-10-CM

## 2018-02-05 DIAGNOSIS — Z79899 Other long term (current) drug therapy: Secondary | ICD-10-CM | POA: Diagnosis not present

## 2018-02-05 DIAGNOSIS — Z9861 Coronary angioplasty status: Secondary | ICD-10-CM | POA: Diagnosis not present

## 2018-02-05 DIAGNOSIS — I10 Essential (primary) hypertension: Secondary | ICD-10-CM

## 2018-02-05 NOTE — Patient Instructions (Signed)
Medication Instructions:  CHANGE TYLENOL TABLETS TO LIQUID  CHANGE CHLORASEPTIC SPRAY TO LOZENGES  ADD AYR NASAL GEL EVERY 6 HOURS  If you need a refill on your cardiac medications before your next appointment, please call your pharmacy.  Special Instructions: ADD HUMIDIFICATION TO O2-TO ASSIST WITH DRY THROAT  Follow-Up: Your physician wants you to follow-up in: KEEP SCHEDULED APPT WITH DR HARDING.   [facility] you [facility] choosing [facility]!!

## 2018-02-05 NOTE — Progress Notes (Signed)
Cardiology Office Note   Date:  02/05/2018   ID:  [name] MoriHelen [name], DOB Nov 13, 1927, MRN [id number]  PCP:  Jacques NavyNorins, Michael E, MD  Cardiologist:  Dr. Herbie BaltimoreHarding  Chief Complaint  Patient presents with  . Follow-up     History of Present Illness: [name] [name] is a 84 y.o. female who presents for for ongoing assessment and management of coronary artery disease, MI with PCI in 2002, nuclear stress test in 2009 which was nonischemic, chronic combined heart failure, with most recent echo revealing EF of 35% with hypokinesis in the inferior lateral walls and akinesis as well as anterior lateral akinesis.  She was also noted to have pulmonary hypertension and is now on home O2, and is also been diagnosed with hypertension and failure to thrive.[name].  She was last seen in the office on 01/22/2018, she was complaining of worsening edema, she also had significant edema in her left arm after having a torn rotator cuff of the left shoulder, with significantly decreased range of motion of the left arm.  A venous ultrasound was completed of the left arm to rule out DVT versus lymphedema. Venous ultrasound dated 01/23/2018 was negative for DVT.  She had been continued on treatment for cellulitis of the left arm by PCP.  Blood pressure was not well controlled and therefore losartan was increased to 100 mg daily and Lasix was continued as directed.  Consideration for Entresto on this visit if kidney function was permissible.  Due to her age cautious institution would be discussed.  She comes today feeling much better.  She has had significant reduction in left upper arm edema, wrapping of lower extremities has also improved her dependent edema.  She is having much better output from diuretic therapy as she keeping her legs elevated, and remains on bedrest 3 days out of the week to allow for diuresis.  She has lost approximately 19 pounds since being seen last is breathing better.  Unfortunately, the patient choked on a  Tylenol pill, it became lodged in her throat causing a good bit of pain.  She was examined by personnel at skilled nursing facility.  The patient was prescribed Chloraseptic Spray.  Tylenol pills are now being crushed and placed in pudding but it still causes her throat to be sore and scratchy when she tries to swallow.  She is being referred to ENT for further evaluation.   Past Medical History:  Diagnosis Date  . Arthritis   . CAD (coronary artery disease)    a. remote MI/stenting in [place] in 2002. b. Nuc 2009: prior inferior MI but no evidence of ischemia, EF 53% with akinesis of inferior wall.  [name]. Hearing loss    a. bilat hearing aids.  . Hip fracture (HCC)    a. s/p prior ORIF.  [name]. HTN (hypertension)   . Hyperlipidemia   . MI (myocardial infarction) (HCC) 2002  . Neuropathy   . Sciatica     Past Surgical History:  Procedure Laterality Date  . ABDOMINAL HYSTERECTOMY    . CATARACT EXTRACTION  1998   RIGHT AND LEFT EYE  . CHOLECYSTECTOMY  2000  . COMPRESSION HIP SCREW  12/16/2011   Procedure: COMPRESSION HIP;  Surgeon: Kerrin ChampagneJames E Nitka, MD;  Location: [facility];  Service: Orthopedics;  Laterality: Right;  . DILATION AND CURETTAGE OF UTERUS  1960  . HYSTRECTOMY    . NM MYOVIEW LTD  2009   EF 53%.  No ischemia or infarction.  [name]. PTCA  2002  .  REFRACTIVE SURGERY  2002  . TRANSTHORACIC ECHOCARDIOGRAM  09/2015    Normal EF 55-60%.  GR to DD.  Aortic sclerosis.  Moderate pulmonary hypertension with PA pressure estimated 70 million mercury.  Moderate to severely reduced RV pressure.  . TRANSTHORACIC ECHOCARDIOGRAM  03/2017   EF 35%.  GR 1 DD.  Inferior akinesis as well as inferolateral akinesis.  Basal-mid anterolateral akinesis.  Aortic sclerosis without stenosis.  Moderate LA dilation.  Moderate RV dilation with mild-mildly reduced function -estimated PA P 62 mmHg.  Mild-severe PI (noted on prior echo)     Current Outpatient Medications  Medication Sig Dispense Refill  . acetaminophen  (TYLENOL) 500 MG tablet Take 1,000 mg by mouth every 6 (six) hours as needed for mild pain.    [name] aspirin EC 81 MG tablet Take 81 mg by mouth daily.    . furosemide (LASIX) 40 MG tablet Take 1 tablet (40 mg total) by mouth daily. 30 tablet 5  . gabapentin (NEURONTIN) 300 MG capsule Take 1 capsule by mouth every morning.    [name] guaiFENesin (ROBITUSSIN) 100 MG/5ML SOLN Take 5 mLs by mouth every 4 (four) hours as needed for cough or to loosen phlegm.    [name] losartan (COZAAR) 100 MG tablet Take 1 tablet (100 mg total) by mouth daily. 90 tablet 3  . metoprolol succinate (TOPROL-XL) 25 MG 24 hr tablet Take 1 tablet (25 mg total) by mouth daily. Take with or immediately following a meal. 30 tablet 1  . pantoprazole (PROTONIX) 20 MG tablet Take 20 mg by mouth daily.     No current facility-administered medications for this visit.     Allergies:   Penicillins    Social History:  The patient  reports that she has never smoked. She has never used smokeless tobacco. She reports that she does not drink alcohol or use drugs.   Family History:  The patient's family history includes Aneurysm (age of onset: 59) in her brother; Cancer (age of onset: 44) in her sister; Hypertension in her father; Pneumonia (age of onset: 76) in her mother.    ROS: All other systems are reviewed and negative. Unless otherwise mentioned in H&P    PHYSICAL EXAM: VS:  BP 138/84   Pulse 62   LMP  (LMP Unknown)  , BMI There is no height or weight on file to calculate BMI. GEN: Well nourished, well developed, in no acute distress  HEENT: normal  Neck: no JVD, carotid bruits, or masses Cardiac: RRR; no murmurs, rubs, or gallops,no edema  Respiratory:  clear to auscultation bilaterally, normal work of breathing GI: soft, nontender, nondistended, + BS MS: no deformity or atrophy Legs wrapped bilaterally. No weeping or significant edema.  Skin: warm and dry, no rash Neuro:  Strength and sensation are intact Psych: euthymic mood,  full affect   EKG: Not completed during office visit.  Recent Labs: 03/07/2017: B Natriuretic Peptide 703.6 03/10/2017: Hemoglobin 13.3; Platelets 299 03/11/2017: Magnesium 2.1 06/20/2017: ALT 7; BUN 18; Creatinine, Ser 0.86; Potassium 3.7; Sodium 145    Lipid Panel    Component Value Date/Time   CHOL 133 02/18/2015 1141   TRIG 84.0 02/18/2015 1141   HDL 53.30 02/18/2015 1141   CHOLHDL 2 02/18/2015 1141   VLDL 16.8 02/18/2015 1141   LDLCALC 63 02/18/2015 1141      Wt Readings from Last 3 Encounters:  01/22/18 159 lb (72.1 kg)  10/18/17 145 lb (65.8 kg)  06/20/17 140 lb (63.5 kg)  Other studies Reviewed:  Echocardiogram 03/23/2018  Left ventricle: The cavity size was normal. Wall thickness was   increased in a pattern of mild LVH. Basal to mid inferior   akinesis. Inferolateral akinesis. Basal to mid anterolateral   akinesis. The estimated ejection fraction was 35%. Doppler   parameters are consistent with abnormal left ventricular   relaxation (grade 1 diastolic dysfunction). - Aortic valve: Trileaflet; moderately calcified leaflets.   Sclerosis without stenosis. There was mild regurgitation. - Mitral valve: There was mild regurgitation. - Left atrium: The atrium was mildly to moderately dilated. - Right ventricle: The cavity size was moderately dilated. Systolic   function was mildly to moderately reduced. - Right atrium: The atrium was mildly dilated. - Tricuspid valve: Peak RV-RA gradient (S): 62 mm Hg. - Pulmonic valve: There was moderate to severe regurgitation. - Systemic veins: IVC not visualized.  ASSESSMENT AND PLAN:  1. HFrEF: She has lost 19 lbs on current diuretic therapy and leg wrapping to assist in venous return. She is observing bedrest 2-3 days out of the week to assist in venous return. She is feeling better and breathing better. She will remain on lasix 40 mg daily.    Review of labs demonstrates creatinine of 0.95.   2. CAD: Hx of MI with PCI  in 2002. No complaints of recurrent chest pain.   3. Hypertension: BP is well controlled today. Continue losartan at current dose.   4. O2 Dependence: I have advised that she have Ayr nasal get for her dryness in the nares and also adding humidifier to O2 for throat and mouth dryness   5. Sore throat:  Apparently from Tylenol pill lodgment in the throat. She is anxious now about taking tylenol pill. I have advised that she take the Tylenol liquid form. She can also change to lozenge instead of spray as she cannot tolerate it.    Current medicines are reviewed at length with the patient today.    Labs/ tests ordered today include: None  Dr. Jens Som as patient no show always Dr. Jens Som patient Bettey Mare. Liborio Nixon, ANP, AACC   02/05/2018 11:02 AM    [facility] [address] [place], [place] [address] Phone: [phone]; Fax: [phone]

## 2018-02-25 ENCOUNTER — Telehealth: Payer: Self-pay | Admitting: Adult Health

## 2018-02-25 NOTE — Telephone Encounter (Signed)
Received phone call from NP at SNF where patient resides concerning patients status. Patient had become dehydrated on current dose of lasix and had stopped eating and drinking 3 days ago. She was lethargic. Lasix was held for 3 days. She was to have follow up BMET today.   She was found to be hypokalemic with potassium of 2.9. This was repleted. She has begun to eat and drink and become more alert.She is now sitting up in a chair. She has begun to have LEE again, and is now started back on low dose lasix 10 mg daily. She will have follow up appointment on August 7th for further evaluation. A repeat BMET will be completed in one week.

## 2018-02-28 ENCOUNTER — Encounter: Payer: Self-pay | Admitting: *Deleted

## 2018-03-12 NOTE — Progress Notes (Signed)
Cardiology Office Note:    Date:  03/13/2018   ID:  [name] [name], DOB July 25, 1928, MRN [id number]  PCP:  Jacques Navy, MD  Cardiologist:  Bryan Lemma, MD   Referring MD: Jacques Navy, MD   Chief Complaint  Patient presents with  . Follow-up    come off O2? and gel in nose.    History of Present Illness:    [name] [name] is a 84 y.o. female with a hx of moderate to severe pulmonary hypertension, hypertension, chronic combined systolic and diastolic heart failure NYHA class III, CAD status post MI with PCI in 2002, hyperlipidemia.  She had a nonischemic nuclear stress test in 2009.  Most recent echo showed an EF of 35% with hypokinesis in the inferior lateral walls and akinesis as well as anterior lateral akinesis.  She is on chronic home O2.  She has recently been diagnosed with failure to thrive.  She was recently seen in the office by Joni Reining on 02/05/2018.  At that time she had a reduction in her left upper arm edema and was wrapping her lower extremities.  He was voiding well on her diuretic therapy.  She had lost approximately 19 pounds and breathing better.  Per phone notes, patient became dehydrated and stopped eating and drinking approximately 4 days ago.  She was noted to be hypokalemic, potassium replaced.  Unfortunately she had a return of her lower extremity edema and the facility restarted her Lasix at 10 mg daily.  She returns for follow-up and management of her CHF.  It sounds as though she is a very picky eater, per her report, and she often skips meals at the facility.  She does not like a lot of the food that they serve.  We spent told majority of the visit brainstorming ways that she can increase her protein intake as well as her hydration.  Her son is with her today at the visit and states that he brings her restaurant food on Saturdays.  I discussed how food and restaurant food has a higher sodium content.  I asked if he would be willing to [place] max  at the facility.  She does not drink any water currently.  We discussed how this could affect her kidneys in the setting of Lasix.  She states that she has lost a significant amount of weight, approximately 40 pounds. We discussed how this weight loss is likely due to her poor nutrition and not due to fluid.  She is volume up on exam with lower extremity edema and JVD.  It is very difficult for me to tell what her Lasix regimen is currently at the facility.  In addition, it sounds as though she has a lot of anxiety about her health status.  She is worried about blood clots losing her legs.  Past Medical History:  Diagnosis Date  . Arthritis   . CAD (coronary artery disease)    a. remote MI/stenting in [place] in 2002. b. Nuc 2009: prior inferior MI but no evidence of ischemia, EF 53% with akinesis of inferior wall.  [name] Hearing loss    a. bilat hearing aids.  . Hip fracture (HCC)    a. s/p prior ORIF.  [name] HTN (hypertension)   . Hyperlipidemia   . MI (myocardial infarction) (HCC) 2002  . Neuropathy   . Sciatica     Past Surgical History:  Procedure Laterality Date  . ABDOMINAL HYSTERECTOMY    . CATARACT EXTRACTION  1998  RIGHT AND LEFT EYE  . CHOLECYSTECTOMY  2000  . COMPRESSION HIP SCREW  12/16/2011   Procedure: COMPRESSION HIP;  Surgeon: Kerrin Champagne, MD;  Location: [facility];  Service: Orthopedics;  Laterality: Right;  . DILATION AND CURETTAGE OF UTERUS  1960  . HYSTRECTOMY    . NM MYOVIEW LTD  2009   EF 53%.  No ischemia or infarction.  [name] PTCA  2002  . REFRACTIVE SURGERY  2002  . TRANSTHORACIC ECHOCARDIOGRAM  09/2015    Normal EF 55-60%.  GR to DD.  Aortic sclerosis.  Moderate pulmonary hypertension with PA pressure estimated 70 million mercury.  Moderate to severely reduced RV pressure.  . TRANSTHORACIC ECHOCARDIOGRAM  03/2017   EF 35%.  GR 1 DD.  Inferior akinesis as well as inferolateral akinesis.  Basal-mid anterolateral akinesis.  Aortic sclerosis without stenosis.  Moderate LA  dilation.  Moderate RV dilation with mild-mildly reduced function -estimated PA P 62 mmHg.  Mild-severe PI (noted on prior echo)    Current Medications: Current Meds  Medication Sig  . acetaminophen (TYLENOL) 500 MG tablet Take 1,000 mg by mouth every 6 (six) hours as needed for mild pain.  [name] aspirin EC 81 MG tablet Take 81 mg by mouth daily.  . furosemide (LASIX) 20 MG tablet Take 0.5 tablets (10 mg total) by mouth daily.  [name] gabapentin (NEURONTIN) 300 MG capsule Take 1 capsule by mouth every morning.  [name] guaiFENesin (ROBITUSSIN) 100 MG/5ML SOLN Take 5 mLs by mouth every 4 (four) hours as needed for cough or to loosen phlegm.  [name] losartan (COZAAR) 100 MG tablet Take 1 tablet (100 mg total) by mouth daily.  . metoprolol succinate (TOPROL-XL) 25 MG 24 hr tablet Take 1 tablet (25 mg total) by mouth daily. Take with or immediately following a meal.  . pantoprazole (PROTONIX) 20 MG tablet Take 20 mg by mouth daily.  . potassium chloride (MICRO-K) 10 MEQ CR capsule   . [DISCONTINUED] atorvastatin (LIPITOR) 20 MG tablet Take 20 mg by mouth daily.  . [DISCONTINUED] clopidogrel (PLAVIX) 75 MG tablet Take 75 mg by mouth daily.  . [DISCONTINUED] furosemide (LASIX) 40 MG tablet Take 1 tablet (40 mg total) by mouth daily. (Patient taking differently: Take 20 mg by mouth daily. )     Allergies:   Penicillins   Social History   Socioeconomic History  . Marital status: Single    Spouse name: Not on file  . Number of children: Not on file  . Years of education: Not on file  . Highest education level: Not on file  Occupational History  . Not on file  Social Needs  . Financial resource strain: Not on file  . Food insecurity:    Worry: Not on file    Inability: Not on file  . Transportation needs:    Medical: Not on file    Non-medical: Not on file  Tobacco Use  . Smoking status: Never Smoker  . Smokeless tobacco: Never Used  Substance and Sexual Activity  . Alcohol use: No  . Drug use: No  .  Sexual activity: Never  Lifestyle  . Physical activity:    Days per week: Not on file    Minutes per session: Not on file  . Stress: Not on file  Relationships  . Social connections:    Talks on phone: Not on file    Gets together: Not on file    Attends religious service: Not on file    Active member  of club or organization: Not on file    Attends meetings of clubs or organizations: Not on file    Relationship status: Not on file  Other Topics Concern  . Not on file  Social History Narrative  . Not on file     Family History: The patient's family history includes Aneurysm (age of onset: 6362) in her brother; Bone cancer in her sister; Breast cancer in her sister; Heart attack (age of onset: 365) in her father; Hypertension in her father; Pneumonia (age of onset: 3586) in her mother.  ROS:   Please see the history of present illness.     All other systems reviewed and are negative.  EKGs/Labs/Other Studies Reviewed:    The following studies were reviewed today:  Echo 03/07/17: Study Conclusions  - Left ventricle: The cavity size was normal. Wall thickness was   increased in a pattern of mild LVH. Basal to mid inferior   akinesis. Inferolateral akinesis. Basal to mid anterolateral   akinesis. The estimated ejection fraction was 35%. Doppler   parameters are consistent with abnormal left ventricular   relaxation (grade 1 diastolic dysfunction). - Aortic valve: Trileaflet; moderately calcified leaflets.   Sclerosis without stenosis. There was mild regurgitation. - Mitral valve: There was mild regurgitation. - Left atrium: The atrium was mildly to moderately dilated. - Right ventricle: The cavity size was moderately dilated. Systolic   function was mildly to moderately reduced. - Right atrium: The atrium was mildly dilated. - Tricuspid valve: Peak RV-RA gradient (S): 62 mm Hg. - Pulmonic valve: There was moderate to severe regurgitation. - Systemic veins: IVC not  visualized.  Impressions:  - Normal LV size with mild LV hypertrophy. EF 35% with wall motion   abnormalities as noted above. Aortic sclerosis without   significant stenosis. Moderately dilated RV with mild to   moderately decreased systolic function. Moderate to severe   pulmonic insufficiency (this was noted on prior echo as well).   Moderate to severe pulmonary hypertension.   EKG:  EKG is not ordered today.   Recent Labs: 06/20/2017: ALT 7; BUN 18; Creatinine, Ser 0.86; Potassium 3.7; Sodium 145  Recent Lipid Panel    Component Value Date/Time   CHOL 133 02/18/2015 1141   TRIG 84.0 02/18/2015 1141   HDL 53.30 02/18/2015 1141   CHOLHDL 2 02/18/2015 1141   VLDL 16.8 02/18/2015 1141   LDLCALC 63 02/18/2015 1141    Physical Exam:    VS:  BP (!) 160/100 (BP Location: Right Arm, Patient Position: Sitting)   Pulse 62   Ht 5\' 3"  (1.6 m)   LMP  (LMP Unknown)   SpO2 97% Comment: on 2L  BMI 24.80 kg/m     Wt Readings from Last 3 Encounters:  02/05/18 140 lb (63.5 kg)  01/22/18 159 lb (72.1 kg)  10/18/17 145 lb (65.8 kg)     GEN: Well nourished, well developed in no acute distress HEENT: Normal NECK: + JVD; No carotid bruits LYMPHATICS: No lymphadenopathy CARDIAC: RRR, no murmurs, rubs, gallops RESPIRATORY: respirations unlabored, diminished in bases ABDOMEN: Soft, non-tender, non-distended MUSCULOSKELETAL:  + B LE edema with compression stockings in place  SKIN: Warm and dry NEUROLOGIC:  Alert and oriented x 3 PSYCHIATRIC:  Normal affect   ASSESSMENT:    1. Chronic combined systolic and diastolic CHF, NYHA class 3 (HCC)   2. Essential hypertension   3. Pulmonary hypertension, moderate to severe (HCC)   4. Medication management    PLAN:  In order of problems listed above:  Chronic combined systolic and diastolic CHF, NYHA class 3 (HCC) Pulmonary hypertension, moderate to severe (HCC) Based on my interview with the patient today, her average p.o. intake  on a daily basis is unclear.  It sounds as though the facility has been altering her diuretic regimen based on her p.o. intake.  As a result, it is difficult to ascertain her fluid status based on her poor p.o. intake and changing dose of diuretic at the facility.  She has lost a significant amount of weight, approximately 40 pounds.  We discussed that this is likely her p.o. intake and not water retention.  We spent the majority of the visit discussing how she could increase her p.o. intake and hydration.  I asked her to supplement with Ensure shakes on the days that she does not eat dinner.  I also asked her to drink 3 to 4 glasses of water per day prior to dinner.  We will order 10 mg of Lasix daily.  She was previously hypokalemic.  I will order at [facility] today.  She needs close follow-up given her fluctuating p.o. intake and fluid status.  Will up with Joni Reining in 2 weeks.   Essential hypertension Medication management Medication changes to her diuretic regimen as above.   Follow-up in 2 weeks.   Medication Adjustments/Labs and Tests Ordered: Current medicines are reviewed at length with the patient today.  Concerns regarding medicines are outlined above.  Orders Placed This Encounter  Procedures  . Basic metabolic panel   Meds ordered this encounter  Medications  . furosemide (LASIX) 20 MG tablet    Sig: Take 0.5 tablets (10 mg total) by mouth daily.    Dispense:  45 tablet    Refill:  3    Signed, Marcelino Duster, [place]  03/13/2018 12:39 PM    [facility]

## 2018-03-13 ENCOUNTER — Ambulatory Visit: Payer: Medicare Other | Admitting: Physician Assistant

## 2018-03-13 ENCOUNTER — Encounter: Payer: Self-pay | Admitting: Physician Assistant

## 2018-03-13 VITALS — BP 160/100 | HR 62 | Ht 63.0 in

## 2018-03-13 DIAGNOSIS — I5042 Chronic combined systolic (congestive) and diastolic (congestive) heart failure: Secondary | ICD-10-CM

## 2018-03-13 DIAGNOSIS — Z79899 Other long term (current) drug therapy: Secondary | ICD-10-CM

## 2018-03-13 DIAGNOSIS — I272 Pulmonary hypertension, unspecified: Secondary | ICD-10-CM | POA: Diagnosis not present

## 2018-03-13 DIAGNOSIS — I10 Essential (primary) hypertension: Secondary | ICD-10-CM

## 2018-03-13 MED ORDER — FUROSEMIDE 20 MG PO TABS: 10.0000 mg | ORAL_TABLET | Freq: Every day | ORAL | 3 refills | Status: DC

## 2018-03-13 NOTE — Patient Instructions (Signed)
Medication Instructions:  Take furosemide (Lasix) 10 mg every morning  Labwork: Today (BMET)  Follow-Up: 2 weeks with Joni ReiningKathryn Lawrence DNP  Any Other Special Instructions Will Be Listed Below (If Applicable).  Drink 3-4 glasses of water daily Supplement missed meals with Ensure    If you need a refill on your cardiac medications before your next appointment, please call your pharmacy.

## 2018-03-14 LAB — BASIC METABOLIC PANEL
BUN / CREAT RATIO: 15 (ref 12–28)
BUN: 9 mg/dL (ref 8–27)
CO2: 26 mmol/L (ref 20–29)
CREATININE: 0.61 mg/dL (ref 0.57–1.00)
Calcium: 10.1 mg/dL (ref 8.7–10.3)
Chloride: 98 mmol/L (ref 96–106)
GFR calc Af Amer: 94 mL/min/{1.73_m2} (ref >59)
GFR calc non Af Amer: 81 mL/min/{1.73_m2} (ref >59)
GLUCOSE: 99 mg/dL (ref 65–99)
POTASSIUM: 4.5 mmol/L (ref 3.5–5.2)
SODIUM: 144 mmol/L (ref 134–144)

## 2018-03-26 NOTE — Progress Notes (Signed)
Cardiology Office Note   Date:  04/01/2018   ID:  [name] [name], DOB February 09, 1928, MRN [id number]  PCP:  Jacques NavyNorins, Michael E, MD  Cardiologist: Dr. Herbie BaltimoreHarding  Chief Complaint  Patient presents with  . Congestive Heart Failure  . Hypertension  . Coronary Artery Disease     History of Present Illness: [name] MoriHelen Iiams is a 84 y.o. female who presents for ongoing assessment and management of chronic combined CHF (EF of 35%)with NYHA class III dyspnea, severe pulmonary HTN, CAD with hx of PCI in 2002, and hyperlipidemia. She was last seen by Micah FlesherAngela Duke, PA on 03/13/2018. She admitted to skipping meals because she didn't like the way the SNF made the meals, not tasting very good. Her son does bring her food from various restaurants on Saturdays. She has lost significant weight of about 40 lbs due to poor nutrition.   She was also found to be fluid overloaded with LEE. It was difficult to evaluate her fluid status as her calorie intake was not well documented. She was asked to drink Ensure shakes on days when she didn't eat meals. She was asked to increase her fluid intake. Failure to thrive was noted. She was continued on lasix 10 mg daily. Labs revealed normal creatinine of 0.61, BUN of 9, Na 144. She was not found to be anemic. WBC were elevated slightly at 10.7.   She comes today with similar complaints and overall deconditioning. Her appetite waxes and wanes. She often skips meals. "I'm a picky eater."  She denies chest pain or dizziness. She is very sedentary.   Past Medical History:  Diagnosis Date  . Arthritis   . CAD (coronary artery disease)    a. [place] in 2002. b. Nuc 2009: prior inferior MI but no evidence of ischemia, EF 53% with akinesis of inferior wall.  [name]. Hearing loss    a. bilat hearing aids.  . Hip fracture (HCC)    a. s/p prior ORIF.  [name]. HTN (hypertension)   . Hyperlipidemia   . MI (myocardial infarction) (HCC) 2002  . Neuropathy   . Sciatica     Past  Surgical History:  Procedure Laterality Date  . ABDOMINAL HYSTERECTOMY    . CATARACT EXTRACTION  1998   RIGHT AND LEFT EYE  . CHOLECYSTECTOMY  2000  . COMPRESSION HIP SCREW  12/16/2011   Procedure: COMPRESSION HIP;  Surgeon: Kerrin ChampagneJames E Nitka, MD;  Location: [facility];  Service: Orthopedics;  Laterality: Right;  . DILATION AND CURETTAGE OF UTERUS  1960  . HYSTRECTOMY    . NM MYOVIEW LTD  2009   EF 53%.  No ischemia or infarction.  [name]. PTCA  2002  . REFRACTIVE SURGERY  2002  . TRANSTHORACIC ECHOCARDIOGRAM  09/2015    Normal EF 55-60%.  GR to DD.  Aortic sclerosis.  Moderate pulmonary hypertension with PA pressure estimated 70 million mercury.  Moderate to severely reduced RV pressure.  . TRANSTHORACIC ECHOCARDIOGRAM  03/2017   EF 35%.  GR 1 DD.  Inferior akinesis as well as inferolateral akinesis.  Basal-mid anterolateral akinesis.  Aortic sclerosis without stenosis.  Moderate LA dilation.  Moderate RV dilation with mild-mildly reduced function -estimated PA P 62 mmHg.  Mild-severe PI (noted on prior echo)     Current Outpatient Medications  Medication Sig Dispense Refill  . acetaminophen (TYLENOL) 500 MG tablet Take 1,000 mg by mouth every 6 (six) hours as needed for mild pain.    [name]. aspirin EC 81 MG tablet  Take 81 mg by mouth daily.    . furosemide (LASIX) 20 MG tablet Take 0.5 tablets (10 mg total) by mouth daily. 45 tablet 3  . gabapentin (NEURONTIN) 300 MG capsule Take 1 capsule by mouth every morning.    [name]. guaiFENesin (ROBITUSSIN) 100 MG/5ML SOLN Take 5 mLs by mouth every 4 (four) hours as needed for cough or to loosen phlegm.    [name]. losartan (COZAAR) 100 MG tablet Take 1 tablet (100 mg total) by mouth daily. 90 tablet 3  . metoprolol succinate (TOPROL-XL) 25 MG 24 hr tablet Take 1 tablet (25 mg total) by mouth daily. Take with or immediately following a meal. 30 tablet 1  . pantoprazole (PROTONIX) 20 MG tablet Take 20 mg by mouth daily.    . potassium chloride (MICRO-K) 10 MEQ CR capsule        No current facility-administered medications for this visit.     Allergies:   Penicillins    Social History:  The patient  reports that she has never smoked. She has never used smokeless tobacco. She reports that she does not drink alcohol or use drugs.   Family History:  The patient's family history includes Aneurysm (age of onset: 5062) in her brother; Bone cancer in her sister; Breast cancer in her sister; Heart attack (age of onset: 5465) in her father; Hypertension in her father; Pneumonia (age of onset: 5386) in her mother.    ROS: All other systems are reviewed and negative. Unless otherwise mentioned in H&P    PHYSICAL EXAM: VS:  BP (!) 158/87   Pulse (!) 58   Ht 5\' 3"  (1.6 m)   LMP  (LMP Unknown)   BMI 24.80 kg/m  , BMI Body mass index is 24.8 kg/m. GEN: Well nourished, well developed, in no acute distress  HEENT: normal  Neck: no JVD, carotid bruits, or masses Cardiac: RRR; bradycardic, no murmurs, rubs, or gallops,no edema  Respiratory:  clear to auscultation bilaterally, normal work of breathing. Wearing O2. Sat 97%.  GI: soft, nontender, nondistended, + BS MS: no deformity or atrophy  Skin: warm and dry, no rash Neuro:  Strength and sensation are intact Psych: euthymic mood, full affect   EKG:  Not completed today.   Recent Labs: 06/20/2017: ALT 7 03/13/2018: BUN 9; Creatinine, Ser 0.61; Potassium 4.5; Sodium 144    Lipid Panel    Component Value Date/Time   CHOL 133 02/18/2015 1141   TRIG 84.0 02/18/2015 1141   HDL 53.30 02/18/2015 1141   CHOLHDL 2 02/18/2015 1141   VLDL 16.8 02/18/2015 1141   LDLCALC 63 02/18/2015 1141      Wt Readings from Last 3 Encounters:  02/05/18 140 lb (63.5 kg)  01/22/18 159 lb (72.1 kg)  10/18/17 145 lb (65.8 kg)    Other studies Reviewed: Echocardiogram 03/07/2017  Left ventricle: The cavity size was normal. Wall thickness was   increased in a pattern of mild LVH. Basal to mid inferior   akinesis. Inferolateral  akinesis. Basal to mid anterolateral   akinesis. The estimated ejection fraction was 35%. Doppler   parameters are consistent with abnormal left ventricular   relaxation (grade 1 diastolic dysfunction). - Aortic valve: Trileaflet; moderately calcified leaflets.   Sclerosis without stenosis. There was mild regurgitation. - Mitral valve: There was mild regurgitation. - Left atrium: The atrium was mildly to moderately dilated. - Right ventricle: The cavity size was moderately dilated. Systolic   function was mildly to moderately reduced. - Right atrium: The  atrium was mildly dilated. - Tricuspid valve: Peak RV-RA gradient (S): 62 mm Hg. - Pulmonic valve: There was moderate to severe regurgitation. - Systemic veins: IVC not visualized.  ASSESSMENT AND PLAN:  1.  Combined CHF: She is still on lasix 10 mg daily and potassium QOD. She is not nutritionally at her best. She is meeting with a nutritionist at the SNF this week.She is to continue low sodium diet and current medical management. She is to have a BMET today and one more on follow up before seeing Dr. Herbie Baltimore.   2. CAD: She denies chest pain. She is not active at all. Continue current regimen.  3. Severe deconditioning: I have recommended PT. More activity may increase her appetite.    Current medicines are reviewed at length with the patient today.    Labs/ tests ordered today include: BMET now and in one month Bettey Mare. Liborio Nixon, ANP, AACC   04/01/2018 1:20 PM    [facility] [address], [place], [place] [address] Phone: [phone]; Fax: [phone]

## 2018-04-01 ENCOUNTER — Telehealth: Payer: Self-pay | Admitting: Adult Health

## 2018-04-01 ENCOUNTER — Ambulatory Visit: Payer: Medicare Other | Admitting: Adult Health

## 2018-04-01 ENCOUNTER — Encounter: Payer: Self-pay | Admitting: Adult Health

## 2018-04-01 VITALS — BP 158/87 | HR 58 | Ht 63.0 in

## 2018-04-01 DIAGNOSIS — I5042 Chronic combined systolic (congestive) and diastolic (congestive) heart failure: Secondary | ICD-10-CM | POA: Diagnosis not present

## 2018-04-01 DIAGNOSIS — I2510 Atherosclerotic heart disease of native coronary artery without angina pectoris: Secondary | ICD-10-CM | POA: Diagnosis not present

## 2018-04-01 DIAGNOSIS — R5381 Other malaise: Secondary | ICD-10-CM | POA: Diagnosis not present

## 2018-04-01 DIAGNOSIS — Z79899 Other long term (current) drug therapy: Secondary | ICD-10-CM | POA: Diagnosis not present

## 2018-04-01 NOTE — Telephone Encounter (Signed)
Called and notified [facility] that the lab should have been drawn at the home as it was not drawn here.   Verbalized understanding. No questions or concerns.

## 2018-04-01 NOTE — Patient Instructions (Signed)
Medication Instructions:  NO CHANGES- Your physician recommends that you continue on your current medications as directed. Please refer to the Current Medication list given to you today.  If you need a refill on your cardiac medications before your next appointment, please call your pharmacy.  Labwork: BMET ASAP  Follow-Up: Your physician wants you to follow-up in: KEEP SCHEDULED APPOINTMENT.  Thank you [facility] choosing [facility]!!

## 2018-04-01 NOTE — Telephone Encounter (Signed)
New message   [name] [name] if [facility] needs to draw the lab or if the lab has been already drawn? Please advise.

## 2018-04-22 ENCOUNTER — Ambulatory Visit: Payer: Medicare Other | Admitting: Cardiology

## 2018-04-22 ENCOUNTER — Encounter: Payer: Self-pay | Admitting: Cardiology

## 2018-04-22 DIAGNOSIS — R627 Adult failure to thrive: Secondary | ICD-10-CM | POA: Diagnosis not present

## 2018-04-22 DIAGNOSIS — Z9861 Coronary angioplasty status: Secondary | ICD-10-CM

## 2018-04-22 DIAGNOSIS — I272 Pulmonary hypertension, unspecified: Secondary | ICD-10-CM | POA: Diagnosis not present

## 2018-04-22 DIAGNOSIS — I2510 Atherosclerotic heart disease of native coronary artery without angina pectoris: Secondary | ICD-10-CM | POA: Diagnosis not present

## 2018-04-22 DIAGNOSIS — I5042 Chronic combined systolic (congestive) and diastolic (congestive) heart failure: Secondary | ICD-10-CM

## 2018-04-22 MED ORDER — FUROSEMIDE 20 MG PO TABS: 10.0000 mg | ORAL_TABLET | ORAL | 3 refills | Status: AC

## 2018-04-22 NOTE — Patient Instructions (Addendum)
Medication Instructions   DECREASE LASIX ( FUROSEMIDE) 10 MG  BY MOUTH IN THE MORNING EVERY OTHER DAY AND THE OTHER DAYS NO LASIX.   MAY TAKE AN ADDITIONAL 10 MG DOSE OF LASIX IF SHE HAS ACUTE SYMPTOMATIC SHORTNESS OF BREATHE ONLY NOT HYPOXIA  Eat what you want -- to  Bring up Calories you can have all the ice cream you want!!   INSTRUCTIONS - REMOVE OXYGEN CANNULA 1 HOUR 3 TIMES A DAY - MORNING , LUNCH AND EVENING AND THEN PLACE BACK ON PATIENT.   Your physician wants you to follow-up in 3 MONTH WITH KATHRYN, DNP.You will receive a reminder letter in the mail two months in advance. If you don't receive a letter, please call our office to schedule the follow-up appointment.    Your physician wants you to follow-up in 6 MONTHS WITH DR HARDING.You will receive a reminder letter in the mail two months in advance. If you don't receive a letter, please call our office to schedule the follow-up appointment.   If you need a refill on your cardiac medications before your next appointment, please call your pharmacy.

## 2018-04-22 NOTE — Progress Notes (Signed)
PCP: Jacques Navy, MD  Clinic Note: Chief Complaint  Patient presents with  . Follow-up    pt reports weight goes up and down  . Coronary Artery Disease  . Congestive Heart Failure    HPI: [name] [name] is a 84 y.o. female with a PMH below who presents today for 3 wk f/u for CAD-PCI, Combined S&DHF (HFrEF - EF ~35%) w/ NYHA III Sx & severe Pulm HTN. Now dealing with FTT. - Now is DNR.    [name] [name] was last seen on 04/01/2018 Joni Reining, DNP)  in f/u from 8/7 visit with Jan Fireman I last saw her in March.  Recommended continued home oxygen along with ocular reduction and baseline Lasix.  We did back her off to 10 mg daily.  We increased losartan.    Chemistry       Component Value Date/Time Value  Date   NA 144 03/13/2018 1226 142  04/01/2018   K 4.5  4.5    CL 98  103    CO2 26  29    BUN 9  16.9    CR 0.61  0.5      Recent Hospitalizations: n/a  Studies Personally Reviewed - (if available, images/films reviewed: From Epic Chart or Care Everywhere)  none  Interval History: [name] [name] presents here today with her son.  She is in a wheelchair coming from the skilled nursing facility.  She definitely has had worsening failure to thrive having lost 37 pounds from her last visit with me.  She is tired of wearing her oxygen all the time, would like to have some break where she is not taking it.  Apparently once she had it all for about an hour and her oxygen levels went down to the 80s. Her edema seems to be relatively stable, and she does not like having the urinate as much as he is with the furosemide. Both she and her son indicate that she is a very picky eater and does not like to eat a lot of the food will bring her at the facility.  As such, she has continued to lose weight as well as energy and is becoming more weak and debilitated.  She is quite adamant about being DNR/DNI.  Enjoys doing the physical therapy, but is more weak and fatigued with doing so.   She has been getting more more week and notes less energy since her weight loss.  She has some mild pedal edema, but no real orthopnea or PND.  No chest pain or shortness of breath with rest or exertion.  She does get short of breath with doing things, but this is been consistent and persistent. She does not like to wear the TED hose, but they really do work with her edema.  Her edema has pretty much gone away. She denies any PND, but does sleep up somewhat so is difficult to tell if she has orthopnea.  No  syncope/near syncope.  But does have some dizziness and unsteady gait. No TIA/amaurosis fugax symptoms. No melena, hematochezia, hematuria, or epstaxis. No claudication.  ROS: A comprehensive was performed. Review of Systems  Constitutional: Positive for malaise/fatigue and weight loss.  HENT: Negative for nosebleeds.   Respiratory: Positive for shortness of breath (Baseline).   Gastrointestinal: Negative for abdominal pain, blood in stool and melena.  Genitourinary: Negative for frequency and hematuria.  Musculoskeletal: Positive for joint pain. Negative for falls.  Neurological: Positive for dizziness. Negative for speech change, focal weakness  and weakness.  Psychiatric/Behavioral: Positive for memory loss. The patient is not nervous/anxious and does not have insomnia.    I have reviewed and (if needed) personally updated the patient's problem list, medications, allergies, past medical and surgical history, social and family history.   Past Medical History:  Diagnosis Date  . Arthritis   . CAD (coronary artery disease)    a. remote MI/stenting in [place] in 2002. b. Nuc 2009: prior inferior MI but no evidence of ischemia, EF 53% with akinesis of inferior wall.  [name] Hearing loss    a. bilat hearing aids.  . Hip fracture (HCC)    a. s/p prior ORIF.  [name] HTN (hypertension)   . Hyperlipidemia   . MI (myocardial infarction) (HCC) 2002  . Neuropathy   . Sciatica     Past Surgical  History:  Procedure Laterality Date  . ABDOMINAL HYSTERECTOMY    . CATARACT EXTRACTION  1998   RIGHT AND LEFT EYE  . CHOLECYSTECTOMY  2000  . COMPRESSION HIP SCREW  12/16/2011   Procedure: COMPRESSION HIP;  Surgeon: Kerrin Champagne, MD;  Location: [facility];  Service: Orthopedics;  Laterality: Right;  . DILATION AND CURETTAGE OF UTERUS  1960  . HYSTRECTOMY    . NM MYOVIEW LTD  2009   EF 53%.  No ischemia or infarction.  [name] PTCA  2002  . REFRACTIVE SURGERY  2002  . TRANSTHORACIC ECHOCARDIOGRAM  09/2015    Normal EF 55-60%.  GR to DD.  Aortic sclerosis.  Moderate pulmonary hypertension with PA pressure estimated 70 million mercury.  Moderate to severely reduced RV pressure.  . TRANSTHORACIC ECHOCARDIOGRAM  03/2017   EF 35%.  GR 1 DD.  Inferior akinesis as well as inferolateral akinesis.  Basal-mid anterolateral akinesis.  Aortic sclerosis without stenosis.  Moderate LA dilation.  Moderate RV dilation with mild-mildly reduced function -estimated PA P 62 mmHg.  Mild-severe PI (noted on prior echo)    Current Meds  Medication Sig  . acetaminophen (TYLENOL) 500 MG tablet Take 1,000 mg by mouth every 6 (six) hours as needed for mild pain.  [name] aspirin EC 81 MG tablet Take 81 mg by mouth daily.  . furosemide (LASIX) 20 MG tablet Take 0.5 tablets (10 mg total) by mouth every other day. MAY TAKE AN ADDITIONAL 10 MG IF SYMPTOMATIC SHORTNESS OF BREATHE  . gabapentin (NEURONTIN) 300 MG capsule Take 1 capsule by mouth every morning.  [name] guaiFENesin (ROBITUSSIN) 100 MG/5ML SOLN Take 5 mLs by mouth every 4 (four) hours as needed for cough or to loosen phlegm.  [name] losartan (COZAAR) 100 MG tablet Take 1 tablet (100 mg total) by mouth daily.  . metoprolol succinate (TOPROL-XL) 25 MG 24 hr tablet Take 1 tablet (25 mg total) by mouth daily. Take with or immediately following a meal.  . pantoprazole (PROTONIX) 20 MG tablet Take 20 mg by mouth daily.  . potassium chloride (MICRO-K) 10 MEQ CR capsule Take 10 mEq by mouth  every other day.   . [DISCONTINUED] furosemide (LASIX) 20 MG tablet Take 0.5 tablets (10 mg total) by mouth daily.    Allergies  Allergen Reactions  . Penicillins Swelling    Swelling of hands and face Has patient had a PCN reaction causing immediate rash, facial/tongue/throat swelling, SOB or lightheadedness with hypotension: unknown Has patient had a PCN reaction causing severe rash involving mucus membranes or skin necrosis: no Has patient had a PCN reaction that required hospitalization: unknown Has patient had a PCN reaction  occurring within the last 10 years: No If all of the above answers are "NO", then may proceed with Cephalosporin use.     Social History   Tobacco Use  . Smoking status: Never Smoker  . Smokeless tobacco: Never Used  Substance Use Topics  . Alcohol use: No  . Drug use: No   Social History   Social History Narrative  . Not on file    family history includes Aneurysm (age of onset: 25) in her brother; Bone cancer in her sister; Breast cancer in her sister; Heart attack (age of onset: 21) in her father; Hypertension in her father; Pneumonia (age of onset: 71) in her mother.  Wt Readings from Last 3 Encounters:  04/22/18 108 lb (49 kg)  02/05/18 140 lb (63.5 kg)  01/22/18 159 lb (72.1 kg)  Wgt from SNF.  PHYSICAL EXAM BP (!) 143/71   Pulse (!) 55   Ht 5\' 3"  (1.6 m)   Wt 108 lb (49 kg) Comment: unable to stand to weigh, weight checked today at facility  LMP  (LMP Unknown)   BMI 19.13 kg/m  Physical Exam  Constitutional: She is oriented to person, place, and time. She appears distressed.  Thin frail elderly woman.  Significant kyphoscoliosis.  Sitting up in a wheelchair. Significant weight loss.  HENT:  Head: Normocephalic and atraumatic.  Neck: No hepatojugular reflux and no JVD present. Carotid bruit is not present.  Cardiovascular: Normal rate, regular rhythm and normal pulses.  Occasional extrasystoles are present. PMI is not displaced.  Exam reveals no gallop and no friction rub.  Murmur heard.  Medium-pitched rumbling crescendo-decrescendo early systolic murmur is present with a grade of 2/6 at the upper right sternal border radiating to the neck. Pulmonary/Chest: No respiratory distress. She has no wheezes. She has no rales.  Mildly decreased breath sounds in the bases and reduced throughout.  Abdominal: Soft. Bowel sounds are normal. She exhibits no distension. There is no tenderness. There is no rebound.  Musculoskeletal: Normal range of motion. She exhibits no edema (Well-controlled with support stockings and low-dose Lasix).  Neurological: She is alert and oriented to person, place, and time.  Psychiatric: She has a normal mood and affect. Her behavior is normal. Judgment and thought content normal.  Vitals reviewed.   Adult ECG Report Not checked  Other studies Reviewed: Additional studies/ records that were reviewed today include:  Recent Labs: Reviewed above   ASSESSMENT / PLAN: Problem List Items Addressed This Visit    CAD - MI/PCI in 2002 (Chronic)    Distant history of CAD-MI.  No active anginal symptoms.  No real heart failure symptoms besides her dyspnea.  No edema or PND, orthopnea. No further evaluation because of his desire to be DNR/DNI.  Is on ARB and beta-blocker as well as aspirin. On fibrillate as opposed to a statin.      Relevant Medications   furosemide (LASIX) 20 MG tablet   Chronic combined systolic and diastolic CHF, NYHA class 3 (HCC) (Chronic)    Probably related to hypertensive heart disease.  Edema seems to be stable.  Does not really have signs of hypervolemia.  Blood pressure still borderline having increased ARB, but for now we will continue with current dose of Toprol and losartan.  Reduce 10 mg Lasix to every other day and as needed symptoms based on sliding scale.      Relevant Medications   furosemide (LASIX) 20 MG tablet   Failure to thrive syndrome, adult  Deconditioned, malnourished.  Thankfully her blood pressure and heart rate are stable. We talked about just increasing her diet whether it be drinking more boost/Ensure shakes versus adding ice cream or chocolate milk.  I think she does need calories with protein at this point.  Would like to try to maintain stable weight as it becomes harder to treat her.      Pulmonary hypertension, moderate to severe (HCC) (Chronic)    Probably pink pulmonary venous congestion related to her HFpEF.  Also probably breathing, could by her kyphoscoliosis. She is truly having episodes of hypoxia, would recommend nasal cannula oxygen.      Relevant Medications   furosemide (LASIX) 20 MG tablet       I spent a total of 35minutes with the patient and chart review. >  50% of the time was spent in direct patient consultation.  Again, [name] JacobsonHelen has multiple questions and it takes several time explaining things to her.  She finally did understand the plan and then we had to make sure that the paperwork was filled out for her nursing facility.  Unfortunately direct consultation was at least 25 to 30 minutes.  5+ minutes were spent with recent history of symptoms.   Current medicines are reviewed at length with the patient today.  (+/- concerns) still says she pees too much with Lasix The following changes have been made:  Reduce Lasix to every other day.  Use PRN for worsening shortness of breath.  Patient Instructions  Medication Instructions   DECREASE LASIX ( FUROSEMIDE) 10 MG  BY MOUTH IN THE MORNING EVERY OTHER DAY AND THE OTHER DAYS NO LASIX.   MAY TAKE AN ADDITIONAL 10 MG DOSE OF LASIX IF SHE HAS ACUTE SYMPTOMATIC SHORTNESS OF BREATHE ONLY NOT HYPOXIA  Eat what you want -- to  Bring up Calories you can have all the ice cream you want!!   INSTRUCTIONS - REMOVE OXYGEN CANNULA 1 HOUR 3 TIMES A DAY - MORNING , LUNCH AND EVENING AND THEN PLACE BACK ON PATIENT.   Your physician wants you to follow-up in 3  MONTH WITH KATHRYN, DNP.You will receive a reminder letter in the mail two months in advance. If you don't receive a letter, please call our office to schedule the follow-up appointment.    Your physician wants you to follow-up in 6 MONTHS WITH DR Harshil Cavallaro.You will receive a reminder letter in the mail two months in advance. If you don't receive a letter, please call our office to schedule the follow-up appointment.   If you need a refill on your cardiac medications before your next appointment, please call your pharmacy.     Studies Ordered:   No orders of the defined types were placed in this encounter.     Bryan Lemmaavid Takeia Ciaravino, M.D., M.S. Interventional Cardiologist   Pager # [phone] Phone # [phone] [address] [place], [place] [address]   Thank you for choosing [facility]!!

## 2018-04-24 ENCOUNTER — Encounter: Payer: Self-pay | Admitting: Cardiology

## 2018-04-24 DIAGNOSIS — R627 Adult failure to thrive: Secondary | ICD-10-CM | POA: Insufficient documentation

## 2018-04-24 NOTE — Assessment & Plan Note (Signed)
Deconditioned, malnourished.  Thankfully her blood pressure and heart rate are stable. We talked about just increasing her diet whether it be drinking more boost/Ensure shakes versus adding ice cream or chocolate milk.  I think she does need calories with protein at this point.  Would like to try to maintain stable weight as it becomes harder to treat her.

## 2018-04-24 NOTE — Assessment & Plan Note (Signed)
Distant history of CAD-MI.  No active anginal symptoms.  No real heart failure symptoms besides her dyspnea.  No edema or PND, orthopnea. No further evaluation because of his desire to be DNR/DNI.  Is on ARB and beta-blocker as well as aspirin. On fibrillate as opposed to a statin.

## 2018-04-24 NOTE — Assessment & Plan Note (Signed)
Probably related to hypertensive heart disease.  Edema seems to be stable.  Does not really have signs of hypervolemia.  Blood pressure still borderline having increased ARB, but for now we will continue with current dose of Toprol and losartan.  Reduce 10 mg Lasix to every other day and as needed symptoms based on sliding scale.

## 2018-04-24 NOTE — Assessment & Plan Note (Signed)
Probably pink pulmonary venous congestion related to her HFpEF.  Also probably breathing, could by her kyphoscoliosis. She is truly having episodes of hypoxia, would recommend nasal cannula oxygen.

## 2018-07-21 NOTE — Progress Notes (Signed)
Cardiology Office Note   Date:  07/22/2018   ID:  [name] MoriHelen [name], DOB [birth year], MRN [id number]  PCP:  Jacques NavyNorins, Michael E, MD  Cardiologist: Dr. Herbie BaltimoreHarding Chief Complaint  Patient presents with  . Coronary Artery Disease  . Congestive Heart Failure     History of Present Illness: [name] MoriHelen Tedder is a 84 y.o. female who presents for ongoing assessment and management of CAD with known history of PCI [place] vessel in [place] in 2002, with nuclear study in 2009 revealing prior inferior MI but no evidence of ischemia.  Other history includes hypertension, systolic heart failure with EF of 35% with [place] heart association class III symptoms hyperlipidemia, hip fracture status post ORIF, currently residing in a skilled nursing facility.  She has also been diagnosed with failure to thrive.  She is oxygen dependent.  She was last seen by Dr. Herbie BaltimoreHarding on 04/22/2018, with no planned ischemic evaluation or testing as she has desire to be a DNR/DNI.  She continues to be treated medically only.  She was continued on metoprolol and losartan, and her Lasix was reduced to 10 mg every other day as needed for symptoms of fluid retention.  She was advised on nutritional supplements.  She comes today with her son. She has not cardiac complaints She continues on O2. She has complaints of right ear canal irritation underneath her hearing aid. She states she feels bumps inside the ear.   Past Medical History:  Diagnosis Date  . Arthritis   . CAD (coronary artery disease)    a. remote [place] in [place] in 2002. b. Nuc 2009: prior inferior MI but no evidence of ischemia, EF 53% with akinesis of inferior wall.  [name]. Hearing loss    a. bilat hearing aids.  . Hip fracture (HCC)    a. s/p prior ORIF.  [name]. HTN (hypertension)   . Hyperlipidemia   . MI (myocardial infarction) (HCC) 2002  . Neuropathy   . Sciatica     Past Surgical History:  Procedure Laterality Date  . ABDOMINAL HYSTERECTOMY    . CATARACT  EXTRACTION  1998   RIGHT AND LEFT EYE  . CHOLECYSTECTOMY  2000  . COMPRESSION HIP SCREW  12/16/2011   Procedure: COMPRESSION HIP;  Surgeon: Kerrin ChampagneJames E Nitka, MD;  Location: [facility];  Service: Orthopedics;  Laterality: Right;  . DILATION AND CURETTAGE OF UTERUS  1960  . HYSTRECTOMY    . NM MYOVIEW LTD  2009   EF 53%.  No ischemia or infarction.  [name]. PTCA  2002  . REFRACTIVE SURGERY  2002  . TRANSTHORACIC ECHOCARDIOGRAM  09/2015    Normal EF 55-60%.  GR to DD.  Aortic sclerosis.  Moderate pulmonary hypertension with PA pressure estimated 70 million mercury.  Moderate to severely reduced RV pressure.  . TRANSTHORACIC ECHOCARDIOGRAM  03/2017   EF 35%.  GR 1 DD.  Inferior akinesis as well as inferolateral akinesis.  Basal-mid anterolateral akinesis.  Aortic sclerosis without stenosis.  Moderate LA dilation.  Moderate RV dilation with mild-mildly reduced function -estimated PA P 62 mmHg.  Mild-severe PI (noted on prior echo)     Current Outpatient Medications  Medication Sig Dispense Refill  . acetaminophen (TYLENOL) 500 MG tablet Take 1,000 mg by mouth every 6 (six) hours as needed for mild pain.    [name]. aspirin EC 81 MG tablet Take 81 mg by mouth daily.    . furosemide (LASIX) 20 MG tablet Take 0.5 tablets (10 mg total) by mouth every other  day. MAY TAKE AN ADDITIONAL 10 MG IF SYMPTOMATIC SHORTNESS OF BREATHE 45 tablet 3  . gabapentin (NEURONTIN) 300 MG capsule Take 1 capsule by mouth every morning.    [name] guaiFENesin (ROBITUSSIN) 100 MG/5ML SOLN Take 5 mLs by mouth every 4 (four) hours as needed for cough or to loosen phlegm.    . metoprolol succinate (TOPROL-XL) 25 MG 24 hr tablet Take 1 tablet (25 mg total) by mouth daily. Take with or immediately following a meal. 30 tablet 1  . mirtazapine (REMERON) 15 MG tablet Take 7.5 mg by mouth at bedtime.    . pantoprazole (PROTONIX) 20 MG tablet Take 20 mg by mouth daily.    . potassium chloride (MICRO-K) 10 MEQ CR capsule Take 10 mEq by mouth every other  day.     . losartan (COZAAR) 100 MG tablet Take 1 tablet (100 mg total) by mouth daily. 90 tablet 3   No current facility-administered medications for this visit.     Allergies:   Penicillins    Social History:  The patient  reports that she has never smoked. She has never used smokeless tobacco. She reports that she does not drink alcohol or use drugs.   Family History:  The patient's family history includes Aneurysm (age of onset: 42) in her brother; Bone cancer in her sister; Breast cancer in her sister; Heart attack (age of onset: 84) in her father; Hypertension in her father; Pneumonia (age of onset: 1) in her mother.    ROS: All other systems are reviewed and negative. Unless otherwise mentioned in H&P    PHYSICAL EXAM: VS:  BP (!) 147/73   Pulse (!) 52   LMP  (LMP Unknown)  , BMI There is no height or weight on file to calculate BMI. GEN: Well nourished, well developed, in no acute distress.Wheel chair dependent.  HEENT: normal. Right ear canal is free of significant cecum, no inflammation is noted. I do not see any growths or bumps.  Neck: no JVD, carotid bruits, or masses Cardiac: RRR; no murmurs, rubs, or gallops,no edema  Respiratory:  Clear to auscultation bilaterally, normal work of breathing on O2 via [place]. She has mild scattered crackles in the bases,  GI: soft, nontender, nondistended, + BS MS: no deformity or atrophy Skin: warm and dry, no rash Neuro:  Strength and sensation are intact Psych: euthymic mood, full affect   EKG: Not completed this office visit.   Recent Labs: 03/13/2018: BUN 9; Creatinine, Ser 0.61; Potassium 4.5; Sodium 144    Lipid Panel    Component Value Date/Time   CHOL 133 02/18/2015 1141   TRIG 84.0 02/18/2015 1141   HDL 53.30 02/18/2015 1141   CHOLHDL 2 02/18/2015 1141   VLDL 16.8 02/18/2015 1141   LDLCALC 63 02/18/2015 1141      Wt Readings from Last 3 Encounters:  04/22/18 108 lb (49 kg)  02/05/18 140 lb (63.5 kg)  01/22/18  159 lb (72.1 kg)    Other studies Reviewed: Echo 03/07/2017 Left ventricle: The cavity size was normal. Wall thickness was   increased in a pattern of mild LVH. Basal to mid inferior   akinesis. Inferolateral akinesis. Basal to mid anterolateral   akinesis. The estimated ejection fraction was 35%. Doppler   parameters are consistent with abnormal left ventricular   relaxation (grade 1 diastolic dysfunction). - Aortic valve: Trileaflet; moderately calcified leaflets.   Sclerosis without stenosis. There was mild regurgitation. - Mitral valve: There was mild regurgitation. - Left atrium:  The atrium was mildly to moderately dilated. - Right ventricle: The cavity size was moderately dilated. Systolic   function was mildly to moderately reduced. - Right atrium: The atrium was mildly dilated. - Tricuspid valve: Peak RV-RA gradient (S): 62 mm Hg. - Pulmonic valve: There was moderate to severe regurgitation. - Systemic veins: IVC not visualized.  Impressions:  - Normal LV size with mild LV hypertrophy. EF 35% with wall motion   abnormalities as noted above. Aortic sclerosis without   significant stenosis. Moderately dilated RV with mild to   moderately decreased systolic function. Moderate to severe   pulmonic insufficiency (this was noted on prior echo as well).   Moderate to severe pulmonary hypertension.cardiogram  ASSESSMENT AND PLAN:  1. CAD: Treated medically. She does not wish to have any further ischemic testing as she is now a DNR/DNI. Will continue metoprolol and losartan.   2. Systolic CHF: No evidence of fluid overload on examination. Continue lasix 10 mg every other day.   3. Right canal ear irritation: I do not see any growths, signs of infection or cecum impaction. I have advised her to see PCP or audiologist who provides the hearing aid to evaluate further if symptoms persist.    Current medicines are reviewed at length with the patient today.    Labs/ tests ordered  today include: None  Bettey Mare. Liborio Nixon, ANP, AACC   07/22/2018 11:26 AM    [facility] [address] Office [phone] Fax [phone]

## 2018-07-22 ENCOUNTER — Ambulatory Visit: Payer: Medicare Other | Admitting: Adult Health

## 2018-07-22 ENCOUNTER — Encounter: Payer: Self-pay | Admitting: Adult Health

## 2018-07-22 VITALS — BP 147/73 | HR 52

## 2018-07-22 DIAGNOSIS — I2510 Atherosclerotic heart disease of native coronary artery without angina pectoris: Secondary | ICD-10-CM | POA: Diagnosis not present

## 2018-07-22 DIAGNOSIS — I5022 Chronic systolic (congestive) heart failure: Secondary | ICD-10-CM

## 2018-07-22 DIAGNOSIS — H61891 Other specified disorders of right external ear: Secondary | ICD-10-CM

## 2018-07-22 NOTE — Patient Instructions (Addendum)
Follow-Up: You will need a follow up appointment in 3 months.  You may see Bryan Lemmaavid Harding, MD  Joni ReiningKathryn Lawrence, DNP, AACC  or one of the following Advanced Practice Providers on your designated Care Team:  Theodore DemarkRhonda Barrett, PA-C  Joni ReiningKathryn Lawrence, DNP, ANP    Medication Instructions:  NO CHANGES- Your physician recommends that you continue on your current medications as directed. Please refer to the Current Medication list given to you today. If you need a refill on your cardiac medications before your next appointment, please call your pharmacy.  Labwork: When you have labs (blood work) and your tests are completely normal, you will receive your results ONLY by MyChart Message (if you have MyChart) -OR- A paper copy in the mail.  At [facility], you and your health needs are our priority.  As part of our continuing mission to provide you with exceptional heart care, we have created designated Provider Care Teams.  These Care Teams include your primary Cardiologist (physician) and Advanced Practice Providers (APPs -  Physician Assistants and Nurse Practitioners) who all work together to provide you with the care you need, when you need it.  Thank you for choosing [facility] at [facility]!!

## 2018-10-30 ENCOUNTER — Ambulatory Visit: Payer: Self-pay | Admitting: Cardiology

## 2020-09-07 DEATH — deceased
